# Patient Record
Sex: Female | Born: 1935 | Race: Black or African American | Hispanic: No | Smoking: Former smoker
Health system: Southern US, Community
[De-identification: ages and names within clinical notes are randomized; demographics above are authoritative.]

## PROBLEM LIST (undated history)

## (undated) DIAGNOSIS — D649 Anemia, unspecified: Secondary | ICD-10-CM

## (undated) DIAGNOSIS — I499 Cardiac arrhythmia, unspecified: Secondary | ICD-10-CM

## (undated) DIAGNOSIS — I609 Nontraumatic subarachnoid hemorrhage, unspecified: Secondary | ICD-10-CM

## (undated) DIAGNOSIS — I10 Essential (primary) hypertension: Secondary | ICD-10-CM

## (undated) DIAGNOSIS — N189 Chronic kidney disease, unspecified: Secondary | ICD-10-CM

## (undated) DIAGNOSIS — J449 Chronic obstructive pulmonary disease, unspecified: Secondary | ICD-10-CM

## (undated) DIAGNOSIS — J45909 Unspecified asthma, uncomplicated: Secondary | ICD-10-CM

## (undated) DIAGNOSIS — K219 Gastro-esophageal reflux disease without esophagitis: Secondary | ICD-10-CM

## (undated) HISTORY — DX: Gastro-esophageal reflux disease without esophagitis: K21.9

## (undated) HISTORY — PX: ABDOMINAL HYSTERECTOMY: SHX81

## (undated) HISTORY — PX: LUNG SURGERY: SHX703

---

## 2014-01-28 ENCOUNTER — Encounter (HOSPITAL_BASED_OUTPATIENT_CLINIC_OR_DEPARTMENT_OTHER): Payer: Self-pay | Admitting: Emergency Medicine

## 2014-01-28 ENCOUNTER — Emergency Department (HOSPITAL_BASED_OUTPATIENT_CLINIC_OR_DEPARTMENT_OTHER)
Admission: EM | Admit: 2014-01-28 | Discharge: 2014-01-28 | Disposition: A | Discharge status: Home / Self Care | Payer: Medicare Other | Attending: Emergency Medicine | Admitting: Emergency Medicine

## 2014-01-28 DIAGNOSIS — R220 Localized swelling, mass and lump, head: Secondary | ICD-10-CM | POA: Insufficient documentation

## 2014-01-28 DIAGNOSIS — T7840XA Allergy, unspecified, initial encounter: Secondary | ICD-10-CM

## 2014-01-28 DIAGNOSIS — Z87828 Personal history of other (healed) physical injury and trauma: Secondary | ICD-10-CM | POA: Insufficient documentation

## 2014-01-28 DIAGNOSIS — J45909 Unspecified asthma, uncomplicated: Secondary | ICD-10-CM | POA: Insufficient documentation

## 2014-01-28 DIAGNOSIS — R221 Localized swelling, mass and lump, neck: Principal | ICD-10-CM

## 2014-01-28 DIAGNOSIS — Z87891 Personal history of nicotine dependence: Secondary | ICD-10-CM | POA: Insufficient documentation

## 2014-01-28 DIAGNOSIS — T492X5A Adverse effect of local astringents and local detergents, initial encounter: Secondary | ICD-10-CM | POA: Insufficient documentation

## 2014-01-28 HISTORY — DX: Unspecified asthma, uncomplicated: J45.909

## 2014-01-28 HISTORY — DX: Nontraumatic subarachnoid hemorrhage, unspecified: I60.9

## 2014-01-28 HISTORY — DX: Essential (primary) hypertension: I10

## 2014-01-28 MED ORDER — DIPHENHYDRAMINE HCL 25 MG PO CAPS: 25.0000 mg | ORAL_CAPSULE | Freq: Three times a day (TID) | ORAL | Status: DC | PRN

## 2014-01-28 MED ORDER — PREDNISONE 50 MG PO TABS
60.0000 mg | ORAL_TABLET | Freq: Once | ORAL | Status: AC
  Administered 2014-01-28: 60 mg via ORAL
  Filled 2014-01-28 (×2): qty 1

## 2014-01-28 MED ORDER — DIPHENHYDRAMINE HCL 25 MG PO CAPS
25.0000 mg | ORAL_CAPSULE | Freq: Once | ORAL | Status: AC
  Administered 2014-01-28: 25 mg via ORAL
  Filled 2014-01-28: qty 1

## 2014-01-28 MED ORDER — PREDNISONE 20 MG PO TABS: 40.0000 mg | ORAL_TABLET | Freq: Once | ORAL | Status: DC

## 2014-01-28 MED ORDER — FAMOTIDINE 20 MG PO TABS
20.0000 mg | ORAL_TABLET | Freq: Once | ORAL | Status: AC
  Administered 2014-01-28: 20 mg via ORAL
  Filled 2014-01-28: qty 1

## 2014-01-28 MED ORDER — FAMOTIDINE 20 MG PO TABS: 20.0000 mg | ORAL_TABLET | Freq: Every day | ORAL | Status: AC

## 2014-01-28 NOTE — Discharge Instructions (Signed)
Allergies °Allergies may happen from anything your body is sensitive to. This may be food, medicines, pollens, chemicals, and nearly anything around you in everyday life that produces allergens. An allergen is anything that causes an allergy producing substance. Heredity is often a factor in causing these problems. This means you may have some of the same allergies as your parents. °Food allergies happen in all age groups. Food allergies are some of the most severe and life threatening. Some common food allergies are cow's milk, seafood, eggs, nuts, wheat, and soybeans. °SYMPTOMS  °· Swelling around the mouth. °· An itchy red rash or hives. °· Vomiting or diarrhea. °· Difficulty breathing. °SEVERE ALLERGIC REACTIONS ARE LIFE-THREATENING. °This reaction is called anaphylaxis. It can cause the mouth and throat to swell and cause difficulty with breathing and swallowing. In severe reactions only a trace amount of food (for example, peanut oil in a salad) may cause death within seconds. °Seasonal allergies occur in all age groups. These are seasonal because they usually occur during the same season every year. They may be a reaction to molds, grass pollens, or tree pollens. Other causes of problems are house dust mite allergens, pet dander, and mold spores. The symptoms often consist of nasal congestion, a runny itchy nose associated with sneezing, and tearing itchy eyes. There is often an associated itching of the mouth and ears. The problems happen when you come in contact with pollens and other allergens. Allergens are the particles in the air that the body reacts to with an allergic reaction. This causes you to release allergic antibodies. Through a chain of events, these eventually cause you to release histamine into the blood stream. Although it is meant to be protective to the body, it is this release that causes your discomfort. This is why you were given anti-histamines to feel better.  If you are unable to  pinpoint the offending allergen, it may be determined by skin or blood testing. Allergies cannot be cured but can be controlled with medicine. °Hay fever is a collection of all or some of the seasonal allergy problems. It may often be treated with simple over-the-counter medicine such as diphenhydramine. Take medicine as directed. Do not drink alcohol or drive while taking this medicine. Check with your caregiver or package insert for child dosages. °If these medicines are not effective, there are many new medicines your caregiver can prescribe. Stronger medicine such as nasal spray, eye drops, and corticosteroids may be used if the first things you try do not work well. Other treatments such as immunotherapy or desensitizing injections can be used if all else fails. Follow up with your caregiver if problems continue. These seasonal allergies are usually not life threatening. They are generally more of a nuisance that can often be handled using medicine. °HOME CARE INSTRUCTIONS  °· If unsure what causes a reaction, keep a diary of foods eaten and symptoms that follow. Avoid foods that cause reactions. °· If hives or rash are present: °· Take medicine as directed. °· You may use an over-the-counter antihistamine (diphenhydramine) for hives and itching as needed. °· Apply cold compresses (cloths) to the skin or take baths in cool water. Avoid hot baths or showers. Heat will make a rash and itching worse. °· If you are severely allergic: °· Following a treatment for a severe reaction, hospitalization is often required for closer follow-up. °· Wear a medic-alert bracelet or necklace stating the allergy. °· You and your family must learn how to give adrenaline or use   an anaphylaxis kit. °· If you have had a severe reaction, always carry your anaphylaxis kit or EpiPen® with you. Use this medicine as directed by your caregiver if a severe reaction is occurring. Failure to do so could have a fatal outcome. °SEEK MEDICAL  CARE IF: °· You suspect a food allergy. Symptoms generally happen within 30 minutes of eating a food. °· Your symptoms have not gone away within 2 days or are getting worse. °· You develop new symptoms. °· You want to retest yourself or your child with a food or drink you think causes an allergic reaction. Never do this if an anaphylactic reaction to that food or drink has happened before. Only do this under the care of a caregiver. °SEEK IMMEDIATE MEDICAL CARE IF:  °· You have difficulty breathing, are wheezing, or have a tight feeling in your chest or throat. °· You have a swollen mouth, or you have hives, swelling, or itching all over your body. °· You have had a severe reaction that has responded to your anaphylaxis kit or an EpiPen®. These reactions may return when the medicine has worn off. These reactions should be considered life threatening. °MAKE SURE YOU:  °· Understand these instructions. °· Will watch your condition. °· Will get help right away if you are not doing well or get worse. °Document Released: 01/17/2003 Document Revised: 02/18/2013 Document Reviewed: 06/23/2008 °[employer]® Patient Information ©2014 [employer]. ° °

## 2014-01-28 NOTE — ED Notes (Signed)
Swelling to face x4 days, getting progressively worse.  No airway compromise.

## 2014-01-28 NOTE — ED Provider Notes (Signed)
CSN: [id number]     Arrival date & time 01/28/14  1604 History   First MD Initiated Contact with Patient 01/28/14 1609     Chief Complaint  Patient presents with  . Facial Swelling     (Consider location/radiation/quality/duration/timing/severity/associated sxs/prior Treatment) HPI Pt reports over the last 4-5 days she has had worsening swelling of her face. States it started around her eyes and she thought it was related to pollen in the air. She also began using a new body soap around that same time. She has not had any itching, no rash on arms, legs, trunk. No throat swelling or difficulty with breathing or swallowing. Sent from ALF for evaluation.   Past Medical History  Diagnosis Date  . Subarachnoid hemorrhage   . Asthma    Past Surgical History  Procedure Laterality Date  . Lung surgery    . Abdominal hysterectomy     No family history on file. History  Substance Use Topics  . Smoking status: Former Games developermoker  . Smokeless tobacco: Not on file  . Alcohol Use: No   OB History   Grav Para Term Preterm Abortions TAB SAB Ect Mult Living                 Review of Systems All other systems reviewed and are negative except as noted in HPI.     Allergies  Review of patient's allergies indicates no known allergies.  Home Medications  No current outpatient prescriptions on file. BP 181/106  Pulse 86  Temp(Src) 98.2 F (36.8 C) (Oral)  Resp 20  SpO2 98% Physical Exam  Nursing note and vitals reviewed. Constitutional: She is oriented to person, place, and time. She appears well-developed and well-nourished.  HENT:  Head: Normocephalic and atraumatic.  Eyes: EOM are normal. Pupils are equal, round, and reactive to light.  Neck: Normal range of motion. Neck supple.  Cardiovascular: Normal rate, normal heart sounds and intact distal pulses.   Pulmonary/Chest: Effort normal and breath sounds normal.  Abdominal: Bowel sounds are normal. She exhibits no distension. There  is no tenderness.  Musculoskeletal: Normal range of motion. She exhibits no edema and no tenderness.  Neurological: She is alert and oriented to person, place, and time. She has normal strength. No cranial nerve deficit or sensory deficit.  Skin: Skin is warm and dry. Rash (mild-moderate soft tissue swelling of face with increased pigmentation compared to photo from January [facility] ) noted.  Psychiatric: She has a normal mood and affect.    ED Course  Procedures (including critical care time) Labs Review Labs Reviewed - No data to display Imaging Review No results found.   EKG Interpretation None      MDM   Final diagnoses:  Allergic reaction    Likely allergic reaction, ?new soap as allergen. Will treat with prednisone, pepcid, benadryl. Avoid topical steroids on face.   5:50 PM Pt feeling better, facial swelling improved. Advised to stop her new soap. Continue short course of prednisone.   Reeve Mallo B. Bernette MayersSheldon, MD 01/28/14 [phone]

## 2015-06-26 ENCOUNTER — Inpatient Hospital Stay (HOSPITAL_COMMUNITY)
Admission: EM | Admit: 2015-06-26 | Discharge: 2015-07-03 | DRG: 190 | Disposition: A | Discharge status: Skilled Nursing Facility | Payer: Medicare HMO | Attending: Internal Medicine | Admitting: Internal Medicine

## 2015-06-26 ENCOUNTER — Emergency Department (HOSPITAL_COMMUNITY): Payer: Medicare HMO

## 2015-06-26 ENCOUNTER — Encounter (HOSPITAL_COMMUNITY): Payer: Self-pay | Admitting: *Deleted

## 2015-06-26 DIAGNOSIS — R4182 Altered mental status, unspecified: Secondary | ICD-10-CM | POA: Diagnosis present

## 2015-06-26 DIAGNOSIS — G9340 Encephalopathy, unspecified: Secondary | ICD-10-CM | POA: Diagnosis not present

## 2015-06-26 DIAGNOSIS — I129 Hypertensive chronic kidney disease with stage 1 through stage 4 chronic kidney disease, or unspecified chronic kidney disease: Secondary | ICD-10-CM | POA: Diagnosis present

## 2015-06-26 DIAGNOSIS — E870 Hyperosmolality and hypernatremia: Secondary | ICD-10-CM | POA: Diagnosis not present

## 2015-06-26 DIAGNOSIS — N189 Chronic kidney disease, unspecified: Secondary | ICD-10-CM | POA: Diagnosis present

## 2015-06-26 DIAGNOSIS — G92 Toxic encephalopathy: Secondary | ICD-10-CM | POA: Diagnosis present

## 2015-06-26 DIAGNOSIS — J9621 Acute and chronic respiratory failure with hypoxia: Secondary | ICD-10-CM | POA: Diagnosis present

## 2015-06-26 DIAGNOSIS — Z79899 Other long term (current) drug therapy: Secondary | ICD-10-CM

## 2015-06-26 DIAGNOSIS — N179 Acute kidney failure, unspecified: Secondary | ICD-10-CM | POA: Diagnosis present

## 2015-06-26 DIAGNOSIS — G4733 Obstructive sleep apnea (adult) (pediatric): Secondary | ICD-10-CM | POA: Diagnosis present

## 2015-06-26 DIAGNOSIS — E872 Acidosis: Secondary | ICD-10-CM | POA: Diagnosis not present

## 2015-06-26 DIAGNOSIS — Z66 Do not resuscitate: Secondary | ICD-10-CM | POA: Diagnosis present

## 2015-06-26 DIAGNOSIS — R479 Unspecified speech disturbances: Secondary | ICD-10-CM

## 2015-06-26 DIAGNOSIS — R4701 Aphasia: Secondary | ICD-10-CM | POA: Diagnosis present

## 2015-06-26 DIAGNOSIS — R0600 Dyspnea, unspecified: Secondary | ICD-10-CM | POA: Diagnosis not present

## 2015-06-26 DIAGNOSIS — Z8249 Family history of ischemic heart disease and other diseases of the circulatory system: Secondary | ICD-10-CM

## 2015-06-26 DIAGNOSIS — J441 Chronic obstructive pulmonary disease with (acute) exacerbation: Secondary | ICD-10-CM | POA: Diagnosis present

## 2015-06-26 DIAGNOSIS — J9622 Acute and chronic respiratory failure with hypercapnia: Secondary | ICD-10-CM | POA: Diagnosis present

## 2015-06-26 DIAGNOSIS — J45909 Unspecified asthma, uncomplicated: Secondary | ICD-10-CM | POA: Diagnosis present

## 2015-06-26 DIAGNOSIS — R0602 Shortness of breath: Secondary | ICD-10-CM | POA: Diagnosis present

## 2015-06-26 DIAGNOSIS — Z87891 Personal history of nicotine dependence: Secondary | ICD-10-CM

## 2015-06-26 DIAGNOSIS — J9602 Acute respiratory failure with hypercapnia: Secondary | ICD-10-CM | POA: Diagnosis present

## 2015-06-26 DIAGNOSIS — I471 Supraventricular tachycardia: Secondary | ICD-10-CM | POA: Diagnosis not present

## 2015-06-26 DIAGNOSIS — J9612 Chronic respiratory failure with hypercapnia: Secondary | ICD-10-CM

## 2015-06-26 LAB — BLOOD GAS, ARTERIAL
Acid-Base Excess: 10.8 mmol/L — ABNORMAL HIGH (ref 0.0–2.0)
Bicarbonate: 41.8 meq/L — ABNORMAL HIGH (ref 20.0–24.0)
DRAWN BY: 276051
Delivery systems: POSITIVE
Drawn by: 103701
EXPIRATORY PAP: 8
FIO2: 0.4
Inspiratory PAP: 16
MODE: POSITIVE
O2 Content: 2 L/min
O2 SAT: 93.5 %
O2 Saturation: 93.9 %
PATIENT TEMPERATURE: 98.6
PH ART: 7.176 — AB (ref 7.350–7.450)
Patient temperature: 98.6
RATE: 12 resp/min
TCO2: 40.4 mmol/L (ref 0–100)
pCO2 arterial: 111 mmHg (ref 35.0–45.0)
pH, Arterial: 7.201 — ABNORMAL LOW (ref 7.350–7.450)
pO2, Arterial: 78.9 mmHg — ABNORMAL LOW (ref 80.0–100.0)
pO2, Arterial: 76.2 mmHg — ABNORMAL LOW (ref 80.0–100.0)

## 2015-06-26 LAB — URINALYSIS, ROUTINE W REFLEX MICROSCOPIC
GLUCOSE, UA: NEGATIVE mg/dL
HGB URINE DIPSTICK: NEGATIVE
Ketones, ur: NEGATIVE mg/dL
LEUKOCYTES UA: NEGATIVE
Nitrite: NEGATIVE
PH: 5 (ref 5.0–8.0)
Protein, ur: 100 mg/dL — AB
SPECIFIC GRAVITY, URINE: 1.018 (ref 1.005–1.030)
Urobilinogen, UA: 0.2 mg/dL (ref 0.0–1.0)

## 2015-06-26 LAB — COMPREHENSIVE METABOLIC PANEL
ALK PHOS: 70 U/L (ref 38–126)
ALT: 28 U/L (ref 14–54)
AST: 54 U/L — ABNORMAL HIGH (ref 15–41)
Albumin: 3.9 g/dL (ref 3.5–5.0)
Anion gap: 9 (ref 5–15)
BUN: 34 mg/dL — ABNORMAL HIGH (ref 6–20)
CALCIUM: 9.3 mg/dL (ref 8.9–10.3)
CO2: 37 mmol/L — ABNORMAL HIGH (ref 22–32)
CREATININE: 2.22 mg/dL — AB (ref 0.44–1.00)
Chloride: 96 mmol/L — ABNORMAL LOW (ref 101–111)
GFR, EST AFRICAN AMERICAN: 22 mL/min — AB (ref >60)
GFR, EST NON AFRICAN AMERICAN: 19 mL/min — AB (ref >60)
Glucose, Bld: 133 mg/dL — ABNORMAL HIGH (ref 65–99)
Potassium: 4.6 mmol/L (ref 3.5–5.1)
Sodium: 142 mmol/L (ref 135–145)
Total Bilirubin: 0.4 mg/dL (ref 0.3–1.2)
Total Protein: 7.3 g/dL (ref 6.5–8.1)

## 2015-06-26 LAB — HEPATIC FUNCTION PANEL
ALBUMIN: 4.1 g/dL (ref 3.5–5.0)
ALT: 28 U/L (ref 14–54)
AST: 44 U/L — AB (ref 15–41)
Alkaline Phosphatase: 76 U/L (ref 38–126)
BILIRUBIN TOTAL: 0.4 mg/dL (ref 0.3–1.2)
Total Protein: 7.6 g/dL (ref 6.5–8.1)

## 2015-06-26 LAB — CBC WITH DIFFERENTIAL/PLATELET
Basophils Absolute: 0 10*3/uL (ref 0.0–0.1)
Basophils Relative: 0 % (ref 0–1)
EOS PCT: 0 % (ref 0–5)
Eosinophils Absolute: 0 10*3/uL (ref 0.0–0.7)
HCT: 35.9 % — ABNORMAL LOW (ref 36.0–46.0)
HEMOGLOBIN: 11.2 g/dL — AB (ref 12.0–15.0)
LYMPHS ABS: 0.8 10*3/uL (ref 0.7–4.0)
Lymphocytes Relative: 8 % — ABNORMAL LOW (ref 12–46)
MCH: 30.7 pg (ref 26.0–34.0)
MCHC: 31.2 g/dL (ref 30.0–36.0)
MCV: 98.4 fL (ref 78.0–100.0)
MONOS PCT: 5 % (ref 3–12)
Monocytes Absolute: 0.4 10*3/uL (ref 0.1–1.0)
NEUTROS PCT: 87 % — AB (ref 43–77)
Neutro Abs: 8 10*3/uL — ABNORMAL HIGH (ref 1.7–7.7)
Platelets: 151 10*3/uL (ref 150–400)
RBC: 3.65 MIL/uL — AB (ref 3.87–5.11)
RDW: 13.9 % (ref 11.5–15.5)
WBC: 9.2 10*3/uL (ref 4.0–10.5)

## 2015-06-26 LAB — BRAIN NATRIURETIC PEPTIDE: B Natriuretic Peptide: 114.7 pg/mL — ABNORMAL HIGH (ref 0.0–100.0)

## 2015-06-26 LAB — CBC
HEMATOCRIT: 36 % (ref 36.0–46.0)
Hemoglobin: 11.4 g/dL — ABNORMAL LOW (ref 12.0–15.0)
MCH: 31.1 pg (ref 26.0–34.0)
MCHC: 31.7 g/dL (ref 30.0–36.0)
MCV: 98.4 fL (ref 78.0–100.0)
PLATELETS: 146 10*3/uL — AB (ref 150–400)
RBC: 3.66 MIL/uL — ABNORMAL LOW (ref 3.87–5.11)
RDW: 14.1 % (ref 11.5–15.5)
WBC: 6.8 10*3/uL (ref 4.0–10.5)

## 2015-06-26 LAB — CREATININE, SERUM
Creatinine, Ser: 2.11 mg/dL — ABNORMAL HIGH (ref 0.44–1.00)
GFR calc non Af Amer: 20 mL/min — ABNORMAL LOW (ref >60)
GFR, EST AFRICAN AMERICAN: 23 mL/min — AB (ref >60)

## 2015-06-26 LAB — LACTIC ACID, PLASMA
Lactic Acid, Venous: 1.7 mmol/L (ref 0.5–2.0)
Lactic Acid, Venous: 1.6 mmol/L (ref 0.5–2.0)

## 2015-06-26 LAB — MAGNESIUM: Magnesium: 2.2 mg/dL (ref 1.7–2.4)

## 2015-06-26 LAB — URINE MICROSCOPIC-ADD ON

## 2015-06-26 LAB — MRSA PCR SCREENING: MRSA BY PCR: NEGATIVE

## 2015-06-26 LAB — GLUCOSE, CAPILLARY
GLUCOSE-CAPILLARY: 77 mg/dL (ref 65–99)
Glucose-Capillary: 107 mg/dL — ABNORMAL HIGH (ref 65–99)

## 2015-06-26 LAB — TSH: TSH: 0.626 u[IU]/mL (ref 0.350–4.500)

## 2015-06-26 LAB — TROPONIN I
TROPONIN I: 0.03 ng/mL (ref <0.031)
TROPONIN I: 0.1 ng/mL — AB (ref <0.031)
Troponin I: 0.1 ng/mL — ABNORMAL HIGH (ref <0.031)

## 2015-06-26 LAB — LIPASE, BLOOD: LIPASE: 24 U/L (ref 22–51)

## 2015-06-26 MED ORDER — NALOXONE HCL 0.4 MG/ML IJ SOLN
INTRAMUSCULAR | Status: AC
  Filled 2015-06-26: qty 1

## 2015-06-26 MED ORDER — LEVALBUTEROL HCL 0.63 MG/3ML IN NEBU: 0.6300 mg | INHALATION_SOLUTION | Freq: Four times a day (QID) | RESPIRATORY_TRACT | Status: DC | PRN

## 2015-06-26 MED ORDER — IPRATROPIUM BROMIDE 0.02 % IN SOLN
0.5000 mg | Freq: Four times a day (QID) | RESPIRATORY_TRACT | Status: DC
  Administered 2015-06-26 – 2015-06-27 (×5): 0.5 mg via RESPIRATORY_TRACT
  Filled 2015-06-26 (×6): qty 2.5

## 2015-06-26 MED ORDER — INSULIN ASPART 100 UNIT/ML ~~LOC~~ SOLN
0.0000 [IU] | SUBCUTANEOUS | Status: DC
  Administered 2015-06-27 (×2): 2 [IU] via SUBCUTANEOUS
  Administered 2015-06-28: 9 [IU] via SUBCUTANEOUS
  Administered 2015-06-28: 1 [IU] via SUBCUTANEOUS
  Administered 2015-06-28 (×2): 2 [IU] via SUBCUTANEOUS
  Administered 2015-06-28: 1 [IU] via SUBCUTANEOUS
  Administered 2015-06-29 (×2): 2 [IU] via SUBCUTANEOUS
  Administered 2015-06-29: 1 [IU] via SUBCUTANEOUS
  Administered 2015-06-30: 2 [IU] via SUBCUTANEOUS
  Administered 2015-06-30: 3 [IU] via SUBCUTANEOUS
  Administered 2015-06-30: 1 [IU] via SUBCUTANEOUS
  Administered 2015-06-30 (×3): 2 [IU] via SUBCUTANEOUS
  Administered 2015-07-01 (×2): 1 [IU] via SUBCUTANEOUS
  Administered 2015-07-01: 2 [IU] via SUBCUTANEOUS
  Administered 2015-07-01 – 2015-07-02 (×4): 1 [IU] via SUBCUTANEOUS

## 2015-06-26 MED ORDER — SODIUM CHLORIDE 0.9 % IV SOLN
INTRAVENOUS | Status: DC
  Administered 2015-06-26 – 2015-06-29 (×5): via INTRAVENOUS

## 2015-06-26 MED ORDER — MOMETASONE FURO-FORMOTEROL FUM 100-5 MCG/ACT IN AERO
2.0000 | INHALATION_SPRAY | Freq: Two times a day (BID) | RESPIRATORY_TRACT | Status: DC
  Administered 2015-06-27 – 2015-07-03 (×13): 2 via RESPIRATORY_TRACT
  Filled 2015-06-26: qty 8.8

## 2015-06-26 MED ORDER — NALOXONE HCL 0.4 MG/ML IJ SOLN
0.4000 mg | Freq: Once | INTRAMUSCULAR | Status: AC
  Administered 2015-06-26: 0.4 mg via INTRAVENOUS

## 2015-06-26 MED ORDER — SODIUM CHLORIDE 0.9 % IV BOLUS (SEPSIS)
250.0000 mL | Freq: Once | INTRAVENOUS | Status: AC
  Administered 2015-06-26: 250 mL via INTRAVENOUS

## 2015-06-26 MED ORDER — ASPIRIN 300 MG RE SUPP
300.0000 mg | Freq: Every day | RECTAL | Status: DC
  Administered 2015-06-26 – 2015-07-03 (×7): 300 mg via RECTAL
  Filled 2015-06-26 (×8): qty 1

## 2015-06-26 MED ORDER — ACETAMINOPHEN 650 MG RE SUPP: 650.0000 mg | Freq: Four times a day (QID) | RECTAL | Status: DC | PRN

## 2015-06-26 MED ORDER — CETYLPYRIDINIUM CHLORIDE 0.05 % MT LIQD: 7.0000 mL | Freq: Two times a day (BID) | OROMUCOSAL | Status: DC

## 2015-06-26 MED ORDER — METHYLPREDNISOLONE SODIUM SUCC 40 MG IJ SOLR
40.0000 mg | Freq: Four times a day (QID) | INTRAMUSCULAR | Status: DC
  Administered 2015-06-26 – 2015-06-27 (×3): 40 mg via INTRAVENOUS
  Filled 2015-06-26 (×3): qty 1

## 2015-06-26 MED ORDER — HEPARIN SODIUM (PORCINE) 5000 UNIT/ML IJ SOLN
5000.0000 [IU] | Freq: Three times a day (TID) | INTRAMUSCULAR | Status: DC
  Administered 2015-06-26 – 2015-07-03 (×21): 5000 [IU] via SUBCUTANEOUS
  Filled 2015-06-26 (×24): qty 1

## 2015-06-26 MED ORDER — SODIUM CHLORIDE 0.9 % IJ SOLN
3.0000 mL | Freq: Two times a day (BID) | INTRAMUSCULAR | Status: DC
  Administered 2015-06-26 – 2015-07-03 (×9): 3 mL via INTRAVENOUS

## 2015-06-26 MED ORDER — CHLORHEXIDINE GLUCONATE 0.12 % MT SOLN
15.0000 mL | Freq: Two times a day (BID) | OROMUCOSAL | Status: DC
  Administered 2015-06-26 – 2015-06-27 (×3): 15 mL via OROMUCOSAL

## 2015-06-26 MED ORDER — SODIUM CHLORIDE 0.9 % IV BOLUS (SEPSIS)
500.0000 mL | Freq: Once | INTRAVENOUS | Status: AC
  Administered 2015-06-26: 500 mL via INTRAVENOUS

## 2015-06-26 MED ORDER — PIPERACILLIN-TAZOBACTAM IN DEX 2-0.25 GM/50ML IV SOLN
2.2500 g | Freq: Four times a day (QID) | INTRAVENOUS | Status: DC
  Administered 2015-06-26 – 2015-06-29 (×12): 2.25 g via INTRAVENOUS
  Filled 2015-06-26 (×14): qty 50

## 2015-06-26 MED ORDER — SODIUM BICARBONATE 8.4 % IV SOLN
INTRAVENOUS | Status: DC
  Administered 2015-06-26: 14:00:00 via INTRAVENOUS
  Filled 2015-06-26: qty 150

## 2015-06-26 MED ORDER — ONDANSETRON HCL 4 MG PO TABS: 4.0000 mg | ORAL_TABLET | Freq: Four times a day (QID) | ORAL | Status: DC | PRN

## 2015-06-26 MED ORDER — ACETAMINOPHEN 325 MG PO TABS: 650.0000 mg | ORAL_TABLET | Freq: Four times a day (QID) | ORAL | Status: DC | PRN

## 2015-06-26 MED ORDER — SODIUM CHLORIDE 0.9 % IV SOLN
INTRAVENOUS | Status: AC
  Administered 2015-06-26: 16:00:00 via INTRAVENOUS

## 2015-06-26 MED ORDER — ONDANSETRON HCL 4 MG/2ML IJ SOLN: 4.0000 mg | Freq: Four times a day (QID) | INTRAMUSCULAR | Status: DC | PRN

## 2015-06-26 NOTE — Consult Note (Signed)
NEURO HOSPITALIST CONSULT NOTE   Referring physician: Abrol   Reason for Consult: Transient inability to speak.   HPI:                                                                                                                                          Andrea Weber is an 81 y.o. female who arrived to [facility] from assisted living due to confusion and generalized weakness with SOB.  Per chart patient has been battling diarrhea since Monday.  On arrival to ED she was found to be Hypoxic with O2 sat 80%.  She was placed on BiPAP and O2 Saturation quickly resolved. At one point in the ED she was noted to have a episode of inability to talk for several minutes then quickly resolved. Per ED note:  "SBP 120's after IVF bolus. Sats remain 99% on O2 2L N/C. ARF on labs; IVF continues. ED RN called me back into the exam room, states pt is no longer speaking with her. Pt is awake and alert, eyes open, will nod head yes/no (appropriately) to questions, no facial droop, grips equal, strength 5/5 equal bilat UE's and LE's. Pt follows commands correctly. Pt appears to start to speak, but does not. Pt will write "hello" on a piece of paper without difficulty." Discussed with ED MD and states at that time her O2 Saturation was normal at that time. She fully followed commands and would nod to answers correctly and write on paper what she was told to write.   Patient is on BiPAP currently.  Bipap was taken off to get history. Patient reports that she recalls when she was writing instead of talking.  Reports that she was not talking because it was just easier.   Past Medical History  Diagnosis Date  . Subarachnoid hemorrhage   . Asthma   . Hypertension     Past Surgical History  Procedure Laterality Date  . Lung surgery    . Abdominal hysterectomy      Family History  Problem Relation Age of Onset  . Hypertension Mother   . Hypertension Father      Social History:  reports  that she has quit smoking. Her smoking use included Cigarettes. She has never used smokeless tobacco. She reports that she does not drink alcohol or use illicit drugs.  No Known Allergies  MEDICATIONS:  Prior to Admission:  Prescriptions prior to admission  Medication Sig Dispense Refill Last Dose  . acetaminophen (TYLENOL) 325 MG tablet Take 650 mg by mouth every 4 (four) hours as needed for mild pain.   UNKNOWN  . albuterol (PROVENTIL HFA;VENTOLIN HFA) 108 (90 BASE) MCG/ACT inhaler Inhale 2 puffs into the lungs every 6 (six) hours as needed for wheezing or shortness of breath.   UNKNOWN  . cholecalciferol (VITAMIN D) 1000 UNITS tablet Take 1,000 Units by mouth daily.   06/25/2015 at Unknown time  . diphenhydrAMINE (BENADRYL) 25 mg capsule Take 1 capsule (25 mg total) by mouth every 8 (eight) hours as needed for itching. 30 capsule 0 UNKNOW  . ferrous sulfate 325 (65 FE) MG tablet Take 325 mg by mouth daily with breakfast.   06/25/2015 at Unknown time  . Fluticasone-Salmeterol (ADVAIR) 250-50 MCG/DOSE AEPB Inhale 1 puff into the lungs 2 (two) times daily.   06/25/2015 at Unknown time  . furosemide (LASIX) 20 MG tablet Take 20 mg by mouth 2 (two) times daily.    06/25/2015 at Unknown time  . loperamide (IMODIUM) 2 MG capsule Take 2 mg by mouth as needed for diarrhea or loose stools.   UNKNOWN  . loratadine (CLARITIN) 10 MG tablet Take 10 mg by mouth daily.   06/25/2015 at Unknown time  . losartan (COZAAR) 100 MG tablet Take 100 mg by mouth every morning.    06/25/2015 at Unknown time  . ondansetron (ZOFRAN) 4 MG tablet Take 4 mg by mouth every 4 (four) hours as needed for nausea or vomiting.   06/25/2015 at Unknown time  . famotidine (PEPCID) 20 MG tablet Take 1 tablet (20 mg total) by mouth daily. (Patient not taking: Reported on 06/26/2015) 7 tablet 0   . predniSONE (DELTASONE) 20 MG  tablet Take 2 tablets (40 mg total) by mouth once. (Patient not taking: Reported on 06/26/2015) 8 tablet 0    Scheduled: . sodium chloride   Intravenous STAT  . antiseptic oral rinse  7 mL Mouth Rinse q12n4p  . aspirin  300 mg Rectal Daily  . chlorhexidine  15 mL Mouth Rinse BID  . heparin  5,000 Units Subcutaneous 3 times per day  . insulin aspart  0-9 Units Subcutaneous 6 times per day  . ipratropium  0.5 mg Nebulization Q6H  . methylPREDNISolone (SOLU-MEDROL) injection  40 mg Intravenous Q6H  . mometasone-formoterol  2 puff Inhalation BID  . piperacillin-tazobactam (ZOSYN)  IV  2.25 g Intravenous Q6H  . sodium chloride  3 mL Intravenous Q12H     ROS:                                                                                                                                       History obtained from the patient  General ROS: negative for - chills, fatigue, fever, night sweats, weight gain or weight loss Psychological ROS: negative  for - behavioral disorder, hallucinations, memory difficulties, mood swings or suicidal ideation Ophthalmic ROS: negative for - blurry vision, double vision, eye pain or loss of vision ENT ROS: negative for - epistaxis, nasal discharge, oral lesions, sore throat, tinnitus or vertigo Allergy and Immunology ROS: negative for - hives or itchy/watery eyes Hematological and Lymphatic ROS: negative for - bleeding problems, bruising or swollen lymph nodes Endocrine ROS: negative for - galactorrhea, hair pattern changes, polydipsia/polyuria or temperature intolerance Respiratory ROS: negative for - cough, hemoptysis, shortness of breath or wheezing Cardiovascular ROS: negative for - chest pain, dyspnea on exertion, edema or irregular heartbeat Gastrointestinal ROS: negative for - abdominal pain, diarrhea, hematemesis, nausea/vomiting or stool incontinence Genito-Urinary ROS: negative for - dysuria, hematuria, incontinence or urinary  frequency/urgency Musculoskeletal ROS: negative for - joint swelling or muscular weakness Neurological ROS: as noted in HPI Dermatological ROS: negative for rash and skin lesion changes   Blood pressure 109/62, pulse 81, temperature 97.8 F (36.6 C), temperature source Oral, resp. rate 21, SpO2 91 %.   Neurologic Examination:                                                                                                      HEENT-  Normocephalic, no lesions, without obvious abnormality.  Normal external eye and conjunctiva.  Normal TM's bilaterally.  Normal auditory canals and external ears. Normal external nose, mucus membranes and septum.  Normal pharynx. Cardiovascular- S1, S2 normal, pulses palpable throughout   Lungs- chest clear, no wheezing, rales, normal symmetric air entry Abdomen- normal findings: bowel sounds normal Extremities- no edema Lymph-no adenopathy palpable Musculoskeletal-no joint tenderness, deformity or swelling Skin-warm and dry, no hyperpigmentation, vitiligo, or suspicious lesions  Neurological Examination Mental Status: Alert, oriented, thought content appropriate.  Speech fluent without evidence of aphasia.  Able to follow 3 step commands without difficulty. Cranial Nerves: II: Discs flat bilaterally; Visual fields grossly normal, pupils equal, round, reactive to light and accommodation III,IV, VI: ptosis not present, extra-ocular motions intact bilaterally V,VII: smile symmetric --at rest right facial droop but patient is edentulous, facial light touch sensation normal bilaterally VIII: hearing normal bilaterally IX,X: uvula rises symmetrically XI: bilateral shoulder shrug XII: midline tongue extension Motor: Right : Upper extremity   5/5    Left:     Upper extremity   5/5  Lower extremity   5/5     Lower extremity   5/5 Tone and bulk:normal tone throughout; no atrophy noted Sensory: Pinprick and light touch intact throughout, bilaterally Deep Tendon  Reflexes: 1+ and symmetric throughout UE and KJ with no AJ Plantars: Right: downgoing   Left: downgoing Cerebellar: normal finger-to-nose and normal heel-to-shin testing bilaterally Gait: not tested as she is on BiPAP    Lab Results: Basic Metabolic Panel:  Recent Labs Lab 06/26/15 1035  NA 142  K 4.6  CL 96*  CO2 37*  GLUCOSE 133*  BUN 34*  CREATININE 2.22*  CALCIUM 9.3    Liver Function Tests:  Recent Labs Lab 06/26/15 1035  AST 54*  ALT 28  ALKPHOS 70  BILITOT  0.4  PROT 7.3  ALBUMIN 3.9    Recent Labs Lab 06/26/15 1035  LIPASE 24   No results for input(s): AMMONIA in the last 168 hours.  CBC:  Recent Labs Lab 06/26/15 1035 06/26/15 1520  WBC 9.2 6.8  NEUTROABS 8.0*  --   HGB 11.2* 11.4*  HCT 35.9* 36.0  MCV 98.4 98.4  PLT 151 146*    Cardiac Enzymes:  Recent Labs Lab 06/26/15 1035  TROPONINI 0.03    Lipid Panel: No results for input(s): CHOL, TRIG, HDL, CHOLHDL, VLDL, LDLCALC in the last 168 hours.  CBG: No results for input(s): GLUCAP in the last 168 hours.  Microbiology: No results found for this or any previous visit.  Coagulation Studies: No results for input(s): LABPROT, INR in the last 72 hours.  Imaging: Dg Chest 2 View  06/26/2015   CLINICAL DATA:  Hypoxia.  Altered mental status.  EXAM: CHEST  2 VIEW  COMPARISON:  None.  FINDINGS: Moderate cardiomegaly and pulmonary vascular congestion are noted. No evidence of frank pulmonary edema or pulmonary consolidation. No evidence of pleural effusion. Bilateral parenchymal scarring noted with surgical staples in the right midlung field.  IMPRESSION: Cardiomegaly and pulmonary vascular congestion.  No acute findings.   Electronically Signed   By: Myles Rosenthal M.D.   On: 06/26/2015 11:53   Ct Head Wo Contrast  06/26/2015   CLINICAL DATA:  Acute mental status changes today.  EXAM: CT HEAD WITHOUT CONTRAST  TECHNIQUE: Contiguous axial images were obtained from the base of the skull  through the vertex without intravenous contrast.  COMPARISON:  None available.  FINDINGS: Stable age related cerebral atrophy, ventriculomegaly and periventricular white matter disease. Benign-appearing bilateral basal ganglia calcifications. No extra-axial fluid collections are identified. No CT findings for acute hemispheric infarction or intracranial hemorrhage. No mass lesions. The brainstem and cerebellum are normal.  The bony structures are intact. The paranasal sinuses and mastoid air cells are clear. The globes are intact. Moderate atherosclerotic calcifications are noted.  IMPRESSION: Age related cerebral atrophy, ventriculomegaly and periventricular white matter disease. No acute intracranial findings or mass lesions.   Electronically Signed   By: Rudie Meyer M.D.   On: 06/26/2015 11:44    Felicie Morn PA-C [facility] [phone]  06/26/2015, 4:07 PM  Patient seen and examined.  Clinical course and management discussed.  Necessary edits performed.  I agree with the above.  Assessment and plan of care developed and discussed below.    Assessment/Plan: 81 year old female presenting with complaints of weakness.  In ED had an episode of not talking.  Remaining neurological examination was unremarkable.  Patient was even able to write.  Patient recalls the event.  Doubt seizure or ischemic event.  Patient now at baseline.    Recommendations: 1.  ASA 81mg  daily 2.  No further neurologic intervention is recommended at this time.  If further questions arise, please call or page at that time.  Thank you for allowing neurology to participate in the care of this patient.  Thana Farr, MD [facility] [phone] 06/26/2015  6:36 PM

## 2015-06-26 NOTE — ED Notes (Signed)
Unable to obtain labs 

## 2015-06-26 NOTE — Progress Notes (Signed)
Spoke with Mattocks,Clarence, POA , informed him about patients condition, diagnosis , and he is now aware that the patient has declined since admission, has severe respiratory acidosis and a DNI , we are adjusting BiPap settings ,but despite all aggressive measures may CODE or pass away tonight

## 2015-06-26 NOTE — H&P (Signed)
[facility] History and Physical  Andrea Weber ZOX:[id number] DOB: 05-Sep-1926 DOA: 06/26/2015  Referring physician:   PCP: [name], MD   Chief Complaint: Shortness of breath  HPI:  81 year old female resident of assisted living, comes in with confusion, generalized weakness, worsening shortness of breath. Patient has had diarrhea since Monday. Last episode of diarrhea was prior to coming in. Patient was found to be hypoxic, 80% on room air, he quickly declined requiring BiPAP support. ABG showed pH of 7.2, PCO2 of 111, bicarbonate 37. Patient also found to be in acute on chronic renal failure. Patient is a poor historian and unable to provide any meaningful history. Most of the history is compiled from the patient's chart. Patient apparently also had an episode of becoming aphasic for several minutes in the ER with her eyes open, no seizure activity was noted, no focal motor weakness was noted, patient started talking after a few minutes and was apparently back to baseline. She did not have any episodes of diarrhea in the ER.      Review of Systems: negative for the following  Constitutional: Denies fever, chills, diaphoresis, appetite change and fatigue.  HEENT: Denies photophobia, eye pain, redness, hearing loss, ear pain, congestion, sore throat, rhinorrhea, sneezing, mouth sores, trouble swallowing, neck pain, neck stiffness and tinnitus.  Respiratory: Denies SOB, DOE, cough, chest tightness, and wheezing.  Cardiovascular: Denies chest pain, palpitations and leg swelling.  Gastrointestinal: Positive for nausea, vomiting, abdominal pain, diarrhea, constipation, blood in stool and abdominal distention.  Genitourinary: Denies dysuria, urgency, frequency, hematuria, flank pain and difficulty urinating.  Musculoskeletal: Denies myalgias, back pain, joint swelling, arthralgias and gait problem.  Skin: Denies pallor, rash and wound.  Neurological: Denies dizziness,  seizures, syncope, weakness, light-headedness, numbness and headaches.  Hematological: Denies adenopathy. Easy bruising, personal or family bleeding history  Psychiatric/Behavioral: Denies suicidal ideation, mood changes, confusion, nervousness, sleep disturbance and agitation       Past Medical History  Diagnosis Date  . Subarachnoid hemorrhage   . Asthma   . Hypertension      Past Surgical History  Procedure Laterality Date  . Lung surgery    . Abdominal hysterectomy        Social History:  reports that she has quit smoking. Her smoking use included Cigarettes. She has never used smokeless tobacco. She reports that she does not drink alcohol or use illicit drugs.    No Known Allergies      FAMILY HISTORY  When questioned  Directly-patient reports  No family history of HTN, CVA ,DIABETES, TB, Cancer CAD, Bleeding Disorders, Sickle Cell, diabetes, anemia, asthma,   Prior to Admission medications   Medication Sig Start Date End Date Taking? Authorizing Provider  acetaminophen (TYLENOL) 325 MG tablet Take 650 mg by mouth every 4 (four) hours as needed for mild pain.   Yes Historical Provider, MD  albuterol (PROVENTIL HFA;VENTOLIN HFA) 108 (90 BASE) MCG/ACT inhaler Inhale 2 puffs into the lungs every 6 (six) hours as needed for wheezing or shortness of breath.   Yes Historical Provider, MD  cholecalciferol (VITAMIN D) 1000 UNITS tablet Take 1,000 Units by mouth daily.   Yes Historical Provider, MD  diphenhydrAMINE (BENADRYL) 25 mg capsule Take 1 capsule (25 mg total) by mouth every 8 (eight) hours as needed for itching. 01/28/14  Yes Susy Frizzle, MD  ferrous sulfate 325 (65 FE) MG tablet Take 325 mg by mouth daily with breakfast.   Yes Historical Provider, MD  Fluticasone-Salmeterol (ADVAIR) 250-50 MCG/DOSE  AEPB Inhale 1 puff into the lungs 2 (two) times daily.   Yes Historical Provider, MD  furosemide (LASIX) 20 MG tablet Take 20 mg by mouth 2 (two) times daily.    Yes  Historical Provider, MD  loperamide (IMODIUM) 2 MG capsule Take 2 mg by mouth as needed for diarrhea or loose stools.   Yes Historical Provider, MD  loratadine (CLARITIN) 10 MG tablet Take 10 mg by mouth daily.   Yes Historical Provider, MD  losartan (COZAAR) 100 MG tablet Take 100 mg by mouth every morning.    Yes Historical Provider, MD  ondansetron (ZOFRAN) 4 MG tablet Take 4 mg by mouth every 4 (four) hours as needed for nausea or vomiting.   Yes Historical Provider, MD  famotidine (PEPCID) 20 MG tablet Take 1 tablet (20 mg total) by mouth daily. Patient not taking: Reported on 06/26/2015 01/28/14   Susy Frizzle, MD  predniSONE (DELTASONE) 20 MG tablet Take 2 tablets (40 mg total) by mouth once. Patient not taking: Reported on 06/26/2015 01/28/14   Susy Frizzle, MD     Physical Exam: Filed Vitals:   06/26/15 1200 06/26/15 1230 06/26/15 1245 06/26/15 1250  BP: 124/58 128/67 115/63   Pulse: 95 90 92   Temp:    97.8 F (36.6 C)  TempSrc:    Oral  Resp: 28 24 26    SpO2:         Constitutional: Vital signs reviewed. Patient is currently on BiPAP,  uncooperative with exam. Awake but disoriented Head: Normocephalic and atraumatic  Ear: TM normal bilaterally  Mouth: no erythema or exudates, MMM  Eyes: PERRL, EOMI, conjunctivae normal, No scleral icterus.  Neck: Supple, Trachea midline normal ROM, No JVD, mass, thyromegaly, or carotid bruit present.  Cardiovascular: RRR, S1 normal, S2 normal, no MRG, pulses symmetric and intact bilaterally  Pulmonary/Chest: CTAB, no wheezes, rales, or rhonchi  Abdominal: Soft. Non-tender, non-distended, bowel sounds are normal, no masses, organomegaly, or guarding present.  GU: no CVA tenderness Musculoskeletal: No joint deformities, erythema, or stiffness, ROM full and no nontender Ext: no edema and no cyanosis, pulses palpable bilaterally (DP and PT)  Hematology: no cervical, inginal, or axillary adenopathy.  Neurological: Awake but disoriented,  Strenght is normal and symmetric bilaterally, cranial nerve II-XII are grossly intact, no focal motor deficit, sensory intact to light touch bilaterally.  Skin: Warm, dry and intact. No rash, cyanosis, or clubbing.  Psychiatric: Normal mood and affect. speech and behavior is normal. Judgment and thought content normal. Cognition and memory are normal.      Data Review   Micro Results No results found for this or any previous visit (from the past 240 hour(s)).  Radiology Reports Dg Chest 2 View  06/26/2015   CLINICAL DATA:  Hypoxia.  Altered mental status.  EXAM: CHEST  2 VIEW  COMPARISON:  None.  FINDINGS: Moderate cardiomegaly and pulmonary vascular congestion are noted. No evidence of frank pulmonary edema or pulmonary consolidation. No evidence of pleural effusion. Bilateral parenchymal scarring noted with surgical staples in the right midlung field.  IMPRESSION: Cardiomegaly and pulmonary vascular congestion.  No acute findings.   Electronically Signed   By: Myles Rosenthal M.D.   On: 06/26/2015 11:53   Ct Head Wo Contrast  06/26/2015   CLINICAL DATA:  Acute mental status changes today.  EXAM: CT HEAD WITHOUT CONTRAST  TECHNIQUE: Contiguous axial images were obtained from the base of the skull through the vertex without intravenous contrast.  COMPARISON:  None available.  FINDINGS: Stable age related cerebral atrophy, ventriculomegaly and periventricular white matter disease. Benign-appearing bilateral basal ganglia calcifications. No extra-axial fluid collections are identified. No CT findings for acute hemispheric infarction or intracranial hemorrhage. No mass lesions. The brainstem and cerebellum are normal.  The bony structures are intact. The paranasal sinuses and mastoid air cells are clear. The globes are intact. Moderate atherosclerotic calcifications are noted.  IMPRESSION: Age related cerebral atrophy, ventriculomegaly and periventricular white matter disease. No acute intracranial  findings or mass lesions.   Electronically Signed   By: Rudie Meyer M.D.   On: 06/26/2015 11:44     CBC  Recent Labs Lab 06/26/15 1035  WBC 9.2  HGB 11.2*  HCT 35.9*  PLT 151  MCV 98.4  MCH 30.7  MCHC 31.2  RDW 13.9  LYMPHSABS 0.8  MONOABS 0.4  EOSABS 0.0  BASOSABS 0.0    Chemistries   Recent Labs Lab 06/26/15 1035  NA 142  K 4.6  CL 96*  CO2 37*  GLUCOSE 133*  BUN 34*  CREATININE 2.22*  CALCIUM 9.3  AST 54*  ALT 28  ALKPHOS 70  BILITOT 0.4   ------------------------------------------------------------------------------------------------------------------ CrCl cannot be calculated (Unknown ideal weight.). ------------------------------------------------------------------------------------------------------------------ No results for input(s): HGBA1C in the last 72 hours. ------------------------------------------------------------------------------------------------------------------ No results for input(s): CHOL, HDL, LDLCALC, TRIG, CHOLHDL, LDLDIRECT in the last 72 hours. ------------------------------------------------------------------------------------------------------------------ No results for input(s): TSH, T4TOTAL, T3FREE, THYROIDAB in the last 72 hours.  Invalid input(s): FREET3 ------------------------------------------------------------------------------------------------------------------ No results for input(s): VITAMINB12, FOLATE, FERRITIN, TIBC, IRON, RETICCTPCT in the last 72 hours.  Coagulation profile No results for input(s): INR, PROTIME in the last 168 hours.  No results for input(s): DDIMER in the last 72 hours.  Cardiac Enzymes  Recent Labs Lab 06/26/15 1035  TROPONINI 0.03   ------------------------------------------------------------------------------------------------------------------ Invalid input(s): POCBNP   CBG: No results for input(s): GLUCAP in the last 168 hours.     EKG: Independently reviewed.     Assessment/Plan Active Problems:   Altered mental status-likely secondary to hypercarbic hypoxemic respiratory failure, patient probably encephalopathic also secondary to acute renal failure, treat underlying causes, CT negative, hold off on MRI to rule out CVA until the patient is more stable. Maybe consider doing an MRI tomorrow after patient is off the BiPAP.    Acute respiratory failure with hypercapnia-continue BiPAP and repeat ABG in a few hours, likely has underlying COPD, start the patient on IV Solu-Medrol, empiric antibiotics as the patient is from a nursing home, chest x-ray negative for pneumonia  Acute renal failure, baseline creatinine unknown, will change the patient to normal saline, patient has elevated bicarbonate probably compensation from a respiratory acidosis and appears to be chronic. Follow renal function   Diarrhea-order GI pathogen panel, enteric precautions, C. difficile PCR, nothing by mouth for now   Code Status:  DO NOT RESUSCITATE Family Communication: No family by the bedside Disposition Plan: admit   Total time spent 55 minutes.Greater than 50% of this time was spent in counseling, explanation of diagnosis, planning of further management, and coordination of care  [facility] Pager [phone]  If 7PM-7AM, please contact night-coverage [email or web address] Password [email or web address] 06/26/2015, 2:13 PM

## 2015-06-26 NOTE — ED Notes (Signed)
Unable to tolerate orthostatic vital signs

## 2015-06-26 NOTE — Progress Notes (Addendum)
ANTIBIOTIC CONSULT NOTE - INITIAL  Pharmacy Consult for Zosyn Indication: rule out pneumonia  No Known Allergies  Patient Measurements:   TBW:   Vital Signs: Temp: 97.8 F (36.6 C) (08/19 1250) Temp Source: Oral (08/19 1250) BP: 119/64 mmHg (08/19 1400) Pulse Rate: 87 (08/19 1400) Intake/Output from previous day:   Intake/Output from this shift:    Labs:  Recent Labs  06/26/15 1035  WBC 9.2  HGB 11.2*  PLT 151  CREATININE 2.22*   CrCl cannot be calculated (Unknown ideal weight.). No results for input(s): VANCOTROUGH, VANCOPEAK, VANCORANDOM, GENTTROUGH, GENTPEAK, GENTRANDOM, TOBRATROUGH, TOBRAPEAK, TOBRARND, AMIKACINPEAK, AMIKACINTROU, AMIKACIN in the last 72 hours.   Microbiology: No results found for this or any previous visit (from the past 720 hour(s)).  Medical History: Past Medical History  Diagnosis Date  . Subarachnoid hemorrhage   . Asthma   . Hypertension    Medications:  Scheduled:  . aspirin  300 mg Rectal Daily  . heparin  5,000 Units Subcutaneous 3 times per day  . insulin aspart  0-9 Units Subcutaneous 6 times per day  . ipratropium  0.5 mg Nebulization Q6H  . methylPREDNISolone (SOLU-MEDROL) injection  40 mg Intravenous Q6H  . sodium chloride  3 mL Intravenous Q12H   Anti-infectives    None     Assessment: 44 yoF ALF resident with worsening SHOB, 4 day hx of diarrhea, in acute renal failure. Aphasic episode in ED, head CT negative. CXray: no acute lung process. Pharmacy requested to dose Zosyn.  SCr 2.22, Cl ~ 20 ml/min (normalized)  Goal of Therapy:  Appropriate antibiotic for treatment, dose/schedule appropriate for renal function  Plan:   Zosyn 2.25gm IV q6hr  Monitor renal function closely, adjust dose/schedule as needed  Otho Bellows PharmD Pager [phone] 06/26/2015, 2:36 PM

## 2015-06-26 NOTE — ED Notes (Addendum)
Called out for altered mental status, Fire dept initially found pt to be hypoxic, spo2 of 80% and altered mental status. Placed pt on NRB.   Upon EMS arrival pt at baseline mental status and spo2 up to 99%. BP 112/60. Have titrated o2 down to 2L [place] en route. Pt now feeling much better, "back to self.".  Patient reports calling staff at [facility] assisted [facility] this morning to request hospital transport, because she "wasn't feeling right." Denies cough, fever, chills or SOB.

## 2015-06-26 NOTE — Clinical Documentation Improvement (Signed)
Hospitalist  Can the diagnosis of CKD be further specified?   CKD Stage I - GFR greater than or equal to 90  CKD Stage II - GFR 60-89  CKD Stage III - GFR 30-59  CKD Stage IV - GFR 15-29  CKD Stage V - GFR < 15  ESRD (End Stage Renal Disease)  Other condition  Unable to clinically determine   Supporting Information: : (risk factors, signs and symptoms, diagnostics, treatment)  Acute on chronic renal failure noted per 8/19 progress notes.  8/19: Bun: 34,   Creat: 2.22,    GFR: 22.    Please exercise your independent, professional judgment when responding. A specific answer is not anticipated or expected.   Thank You, Rodman Pickle [employer] [place]

## 2015-06-26 NOTE — ED Provider Notes (Signed)
CSN: [id number]     Arrival date & time 06/26/15  1324 History   First MD Initiated Contact with Patient 06/26/15 [phone]     Chief Complaint  Patient presents with  . Altered Mental Status      HPI Pt was seen at 0955. Per EMS, NH report and pt: Pt c/o gradual onset and slow improvement of constant generalized weakness that began this morning. Pt states she was standing in her room at the assisted living facility, when she "felt confused" and "weak." Pt states she called the staff for assistance. EMS noted pt's O2 Sat to be "80%" on R/A on their arrival to the scene. O2 Sat improved to 99% on O2 2L N/C. Pt states she "feels much better now." Pt states she has had "watery diarrhea" for the past 4 days; which has been associated with poor PO intake. Denies syncope, no N/V, no abd pain, no back pain, no CP/SOB, no fevers, no black or blood in stools, no focal motor weakness, no tingling/numbness in extremities.     Past Medical History  Diagnosis Date  . Subarachnoid hemorrhage   . Asthma   . Hypertension    Past Surgical History  Procedure Laterality Date  . Lung surgery    . Abdominal hysterectomy      Social History  Substance Use Topics  . Smoking status: Former Games developer  . Smokeless tobacco: None  . Alcohol Use: No    Review of Systems ROS: Statement: All systems negative except as marked or noted in the HPI; Constitutional: Negative for fever and chills. ; ; Eyes: Negative for eye pain, redness and discharge. ; ; ENMT: Negative for ear pain, hoarseness, nasal congestion, sinus pressure and sore throat. ; ; Cardiovascular: Negative for chest pain, palpitations, diaphoresis, dyspnea and peripheral edema. ; ; Respiratory: Negative for cough, wheezing and stridor. ; ; Gastrointestinal: +diarrhea, poor PO intake. Negative for nausea, vomiting, abdominal pain, blood in stool, hematemesis, jaundice and rectal bleeding. . ; ; Genitourinary: Negative for dysuria, flank pain and hematuria. ; ;  Musculoskeletal: Negative for back pain and neck pain. Negative for swelling and trauma.; ; Skin: Negative for pruritus, rash, abrasions, blisters, bruising and skin lesion.; ; Neuro: +generalized weakness, AMS. Negative for headache, lightheadedness and neck stiffness. Negative for altered level of consciousness , extremity weakness, paresthesias, involuntary movement, seizure and syncope.      Allergies  Review of patient's allergies indicates no known allergies.  Home Medications   Prior to Admission medications   Medication Sig Start Date End Date Taking? Authorizing Provider  acetaminophen (TYLENOL) 325 MG tablet Take 650 mg by mouth every 4 (four) hours as needed for mild pain.   Yes Historical Provider, MD  albuterol (PROVENTIL HFA;VENTOLIN HFA) 108 (90 BASE) MCG/ACT inhaler Inhale 2 puffs into the lungs every 6 (six) hours as needed for wheezing or shortness of breath.   Yes Historical Provider, MD  cholecalciferol (VITAMIN D) 1000 UNITS tablet Take 1,000 Units by mouth daily.   Yes Historical Provider, MD  diphenhydrAMINE (BENADRYL) 25 mg capsule Take 1 capsule (25 mg total) by mouth every 8 (eight) hours as needed for itching. 01/28/14  Yes Susy Frizzle, MD  ferrous sulfate 325 (65 FE) MG tablet Take 325 mg by mouth daily with breakfast.   Yes Historical Provider, MD  Fluticasone-Salmeterol (ADVAIR) 250-50 MCG/DOSE AEPB Inhale 1 puff into the lungs 2 (two) times daily.   Yes Historical Provider, MD  furosemide (LASIX) 20 MG tablet Take 20  mg by mouth 2 (two) times daily.    Yes Historical Provider, MD  loperamide (IMODIUM) 2 MG capsule Take 2 mg by mouth as needed for diarrhea or loose stools.   Yes Historical Provider, MD  loratadine (CLARITIN) 10 MG tablet Take 10 mg by mouth daily.   Yes Historical Provider, MD  losartan (COZAAR) 100 MG tablet Take 100 mg by mouth every morning.    Yes Historical Provider, MD  ondansetron (ZOFRAN) 4 MG tablet Take 4 mg by mouth every 4 (four)  hours as needed for nausea or vomiting.   Yes Historical Provider, MD  famotidine (PEPCID) 20 MG tablet Take 1 tablet (20 mg total) by mouth daily. Patient not taking: Reported on 06/26/2015 01/28/14   Susy Frizzle, MD  predniSONE (DELTASONE) 20 MG tablet Take 2 tablets (40 mg total) by mouth once. Patient not taking: Reported on 06/26/2015 01/28/14   Susy Frizzle, MD   BP 93/49 mmHg  Pulse 106  Temp(Src) 98 F (36.7 C) (Oral)  Resp 28  SpO2 92%   Patient Vitals for the past 24 hrs:  BP Temp Temp src Pulse Resp SpO2  06/26/15 1250 - 97.8 F (36.6 C) Oral - - -  06/26/15 1245 115/63 mmHg - - 92 26 -  06/26/15 1230 128/67 mmHg - - 90 24 -  06/26/15 1200 124/58 mmHg - - 95 (!) 28 -  06/26/15 1136 (!) 115/54 mmHg - - 90 - 100 %  06/26/15 1100 113/61 mmHg - - 94 (!) 32 -  06/26/15 1048 92/59 mmHg - - 94 22 -  06/26/15 1030 (!) 72/45 mmHg - - 95 22 -  06/26/15 0946 - - - - - 92 %  06/26/15 0941 (!) 93/49 mmHg 98 F (36.7 C) Oral 106 (!) 28 (!) 77 %  06/26/15 0930 - - - - - (!) 80 %     Physical Exam  1000: Physical examination:  Nursing notes reviewed; Vital signs and O2 SAT reviewed;  Constitutional: Well developed, Well nourished, In no acute distress; Head:  Normocephalic, atraumatic; Eyes: EOMI, PERRL, No scleral icterus; ENMT: Mouth and pharynx normal, Mucous membranes dry;; Neck: Supple, Full range of motion, No lymphadenopathy; Cardiovascular: Regular rate and rhythm, No gallop; Respiratory: Breath sounds clear & equal bilaterally, No wheezes.  Speaking full sentences with ease, Normal respiratory effort/excursion; Chest: Nontender, Movement normal; Abdomen: Soft, Nontender, Nondistended, Normal bowel sounds; Genitourinary: No CVA tenderness; Extremities: Pulses normal, No tenderness, No edema, No calf edema or asymmetry.; Neuro: AA&Ox3, Major CN grossly intact. No facial droop. Speech clear. Grips equal. Strength 5/5 equal bilat UE's and LE's. Moves all extremities on stretcher  without apparent gross focal motor deficits.; Skin: Color normal, Warm, Dry.   ED Course  Procedures    Labs Review Imaging Review I have personally reviewed and evaluated these images and lab results as part of my medical decision-making.    EKG Interpretation   Date/Time:  Friday June 26 2015 10:38:28 EDT Ventricular Rate:  93 PR Interval:  184 QRS Duration: 97 QT Interval:  406 QTC Calculation: 505 R Axis:   -103 Text Interpretation:  Sinus rhythm Left axis deviation Inferior infarct,  old Anterior infarct, old Prolonged QT interval No old tracing to compare  Confirmed by [facility]  MD, Nicholos Johns [id number]) on 06/26/2015 10:55:37 AM      MDM  MDM Reviewed: previous chart, nursing note and vitals Interpretation: labs, ECG and x-ray Total time providing critical care: 30-74 minutes. This excludes time spent  performing separately reportable procedures and services. Consults: admitting MD   CRITICAL CARE Performed by: Laray Anger Total critical care time: 45 Critical care time was exclusive of separately billable procedures and treating other patients. Critical care was necessary to treat or prevent imminent or life-threatening deterioration. Critical care was time spent personally by me on the following activities: development of treatment plan with patient and/or surrogate as well as nursing, discussions with consultants, evaluation of patient's response to treatment, examination of patient, obtaining history from patient or surrogate, ordering and performing treatments and interventions, ordering and review of laboratory studies, ordering and review of radiographic studies, pulse oximetry and re-evaluation of patient's condition.  Results for orders placed or performed during the hospital encounter of 06/26/15  Urinalysis, Routine w reflex microscopic (not at [facility])  Result Value Ref Range   Color, Urine YELLOW YELLOW   APPearance CLOUDY (A) CLEAR   Specific  Gravity, Urine 1.018 1.005 - 1.030   pH 5.0 5.0 - 8.0   Glucose, UA NEGATIVE NEGATIVE mg/dL   Hgb urine dipstick NEGATIVE NEGATIVE   Bilirubin Urine SMALL (A) NEGATIVE   Ketones, ur NEGATIVE NEGATIVE mg/dL   Protein, ur 914 (A) NEGATIVE mg/dL   Urobilinogen, UA 0.2 0.0 - 1.0 mg/dL   Nitrite NEGATIVE NEGATIVE   Leukocytes, UA NEGATIVE NEGATIVE  Comprehensive metabolic panel  Result Value Ref Range   Sodium 142 135 - 145 mmol/L   Potassium 4.6 3.5 - 5.1 mmol/L   Chloride 96 (L) 101 - 111 mmol/L   CO2 37 (H) 22 - 32 mmol/L   Glucose, Bld 133 (H) 65 - 99 mg/dL   BUN 34 (H) 6 - 20 mg/dL   Creatinine, Ser 7.82 (H) 0.44 - 1.00 mg/dL   Calcium 9.3 8.9 - 95.6 mg/dL   Total Protein 7.3 6.5 - 8.1 g/dL   Albumin 3.9 3.5 - 5.0 g/dL   AST 54 (H) 15 - 41 U/L   ALT 28 14 - 54 U/L   Alkaline Phosphatase 70 38 - 126 U/L   Total Bilirubin 0.4 0.3 - 1.2 mg/dL   GFR calc non Af Amer 19 (L) >60 mL/min   GFR calc Af Amer 22 (L) >60 mL/min   Anion gap 9 5 - 15  Lipase, blood  Result Value Ref Range   Lipase 24 22 - 51 U/L  Troponin I  Result Value Ref Range   Troponin I 0.03 <0.031 ng/mL  Lactic acid, plasma  Result Value Ref Range   Lactic Acid, Venous 1.7 0.5 - 2.0 mmol/L  CBC with Differential  Result Value Ref Range   WBC 9.2 4.0 - 10.5 K/uL   RBC 3.65 (L) 3.87 - 5.11 MIL/uL   Hemoglobin 11.2 (L) 12.0 - 15.0 g/dL   HCT 21.3 (L) 08.6 - 57.8 %   MCV 98.4 78.0 - 100.0 fL   MCH 30.7 26.0 - 34.0 pg   MCHC 31.2 30.0 - 36.0 g/dL   RDW 46.9 62.9 - 52.8 %   Platelets 151 150 - 400 K/uL   Neutrophils Relative % 87 (H) 43 - 77 %   Neutro Abs 8.0 (H) 1.7 - 7.7 K/uL   Lymphocytes Relative 8 (L) 12 - 46 %   Lymphs Abs 0.8 0.7 - 4.0 K/uL   Monocytes Relative 5 3 - 12 %   Monocytes Absolute 0.4 0.1 - 1.0 K/uL   Eosinophils Relative 0 0 - 5 %   Eosinophils Absolute 0.0 0.0 - 0.7 K/uL   Basophils  Relative 0 0 - 1 %   Basophils Absolute 0.0 0.0 - 0.1 K/uL  Urine microscopic-add on  Result Value  Ref Range   Squamous Epithelial / LPF RARE RARE   WBC, UA 3-6 <3 WBC/hpf   Bacteria, UA MANY (A) RARE   Casts HYALINE CASTS (A) NEGATIVE  Blood gas, arterial  Result Value Ref Range   O2 Content 2.0 L/min   Delivery systems NASAL CANNULA    pH, Arterial 7.201 (L) 7.350 - 7.450   pCO2 arterial 111 (HH) 35.0 - 45.0 mmHg   pO2, Arterial 78.9 (L) 80.0 - 100.0 mmHg   Bicarbonate 41.8 (H) 20.0 - 24.0 mEq/L   TCO2 40.4 0 - 100 mmol/L   Acid-Base Excess 10.8 (H) 0.0 - 2.0 mmol/L   O2 Saturation 93.9 %   Patient temperature 98.6    Collection site BRACHIAL ARTERY    Drawn by [id number]    Sample type ARTERIAL    Dg Chest 2 View 06/26/2015   CLINICAL DATA:  Hypoxia.  Altered mental status.  EXAM: CHEST  2 VIEW  COMPARISON:  None.  FINDINGS: Moderate cardiomegaly and pulmonary vascular congestion are noted. No evidence of frank pulmonary edema or pulmonary consolidation. No evidence of pleural effusion. Bilateral parenchymal scarring noted with surgical staples in the right midlung field.  IMPRESSION: Cardiomegaly and pulmonary vascular congestion.  No acute findings.   Electronically Signed   By: Myles Rosenthal M.D.   On: 06/26/2015 11:53   Ct Head Wo Contrast 06/26/2015   CLINICAL DATA:  Acute mental status changes today.  EXAM: CT HEAD WITHOUT CONTRAST  TECHNIQUE: Contiguous axial images were obtained from the base of the skull through the vertex without intravenous contrast.  COMPARISON:  None available.  FINDINGS: Stable age related cerebral atrophy, ventriculomegaly and periventricular white matter disease. Benign-appearing bilateral basal ganglia calcifications. No extra-axial fluid collections are identified. No CT findings for acute hemispheric infarction or intracranial hemorrhage. No mass lesions. The brainstem and cerebellum are normal.  The bony structures are intact. The paranasal sinuses and mastoid air cells are clear. The globes are intact. Moderate atherosclerotic calcifications are noted.   IMPRESSION: Age related cerebral atrophy, ventriculomegaly and periventricular white matter disease. No acute intracranial findings or mass lesions.   Electronically Signed   By: Rudie Meyer M.D.   On: 06/26/2015 11:44      1045:  SBP dropped to 70's. 2nd IVF bolus ordered. 2nd IV saline lock placed.   1210:  SBP 120's after IVF bolus. Sats remain 99% on O2 2L N/C. ARF on labs; IVF continues. ED RN called me back into the exam room, states pt is no longer speaking with her. Pt is awake and alert, eyes open, will nod head yes/no (appropriately) to questions, no facial droop, grips equal, strength 5/5 equal bilat UE's and LE's. Pt follows commands correctly. Pt appears to start to speak, but does not. Pt will write "hello" on a piece of paper without difficulty. Code Stroke called. ABG ordered.   1225:  T/C to [facility] Neuro Dr. Thad Ranger, case discussed, including:  HPI, pertinent PM/SHx, VS/PE, dx testing, ED course and treatment:  Given only symptom is aphasia, less likely stroke, obtain CT-A head/neck if GFR high enough, otherwise MRI brain without contrast, admit to medicine service. Rads MD confirms pt's GFR is too low for any IV contrast (CT or MRI).   1245:  ABG appears to be chronic/compensated resp acidosis. Pt is now starting to speak few clear words.  Will start bipap and re-check ABG.  T/C to [facility] Dr. Susie Cassette, case discussed, including:  HPI, pertinent PM/SHx, VS/PE, dx testing, ED course and treatment:  Agreeable to admit, requests to write temporary orders, obtain stepdown bed to team [facility].   Samuel Jester, DO 06/28/15 1422

## 2015-06-26 NOTE — ED Notes (Signed)
Bed: WA09 Expected date:  Expected time:  Means of arrival:  Comments: EMS female low O2 sats

## 2015-06-26 NOTE — ED Notes (Signed)
Unable to obtain blood sample

## 2015-06-27 LAB — BLOOD GAS, ARTERIAL
ACID-BASE EXCESS: 10.3 mmol/L — AB (ref 0.0–2.0)
Bicarbonate: 36.7 mEq/L — ABNORMAL HIGH (ref 20.0–24.0)
DELIVERY SYSTEMS: POSITIVE
Expiratory PAP: 6
FIO2: 0.4
INSPIRATORY PAP: 18
MODE: POSITIVE
O2 SAT: 97.6 %
PH ART: 7.384 (ref 7.350–7.450)
Patient temperature: 98.6
TCO2: 34.2 mmol/L (ref 0–100)
pCO2 arterial: 62.8 mmHg (ref 35.0–45.0)
pO2, Arterial: 95.1 mmHg (ref 80.0–100.0)

## 2015-06-27 LAB — CBC
HEMATOCRIT: 33.7 % — AB (ref 36.0–46.0)
Hemoglobin: 10.4 g/dL — ABNORMAL LOW (ref 12.0–15.0)
MCH: 30 pg (ref 26.0–34.0)
MCHC: 30.9 g/dL (ref 30.0–36.0)
MCV: 97.1 fL (ref 78.0–100.0)
Platelets: 133 10*3/uL — ABNORMAL LOW (ref 150–400)
RBC: 3.47 MIL/uL — ABNORMAL LOW (ref 3.87–5.11)
RDW: 13.8 % (ref 11.5–15.5)
WBC: 5.1 10*3/uL (ref 4.0–10.5)

## 2015-06-27 LAB — COMPREHENSIVE METABOLIC PANEL
ALBUMIN: 3.5 g/dL (ref 3.5–5.0)
ALT: 23 U/L (ref 14–54)
ANION GAP: 3 — AB (ref 5–15)
AST: 28 U/L (ref 15–41)
Alkaline Phosphatase: 62 U/L (ref 38–126)
BILIRUBIN TOTAL: 0.8 mg/dL (ref 0.3–1.2)
BUN: 35 mg/dL — ABNORMAL HIGH (ref 6–20)
CO2: 41 mmol/L — ABNORMAL HIGH (ref 22–32)
Calcium: 9.1 mg/dL (ref 8.9–10.3)
Chloride: 99 mmol/L — ABNORMAL LOW (ref 101–111)
Creatinine, Ser: 2.07 mg/dL — ABNORMAL HIGH (ref 0.44–1.00)
GFR calc Af Amer: 24 mL/min — ABNORMAL LOW (ref >60)
GFR, EST NON AFRICAN AMERICAN: 20 mL/min — AB (ref >60)
GLUCOSE: 113 mg/dL — AB (ref 65–99)
POTASSIUM: 5.1 mmol/L (ref 3.5–5.1)
Sodium: 143 mmol/L (ref 135–145)
TOTAL PROTEIN: 6.7 g/dL (ref 6.5–8.1)

## 2015-06-27 LAB — HEMOGLOBIN A1C
HEMOGLOBIN A1C: 4.9 % (ref 4.8–5.6)
MEAN PLASMA GLUCOSE: 94 mg/dL

## 2015-06-27 LAB — GLUCOSE, CAPILLARY
GLUCOSE-CAPILLARY: 99 mg/dL (ref 65–99)
GLUCOSE-CAPILLARY: 95 mg/dL (ref 65–99)
Glucose-Capillary: 73 mg/dL (ref 65–99)
Glucose-Capillary: 196 mg/dL — ABNORMAL HIGH (ref 65–99)
Glucose-Capillary: 183 mg/dL — ABNORMAL HIGH (ref 65–99)
Glucose-Capillary: 167 mg/dL — ABNORMAL HIGH (ref 65–99)

## 2015-06-27 LAB — TROPONIN I: Troponin I: 0.09 ng/mL — ABNORMAL HIGH (ref <0.031)

## 2015-06-27 MED ORDER — METHYLPREDNISOLONE SODIUM SUCC 125 MG IJ SOLR
80.0000 mg | Freq: Three times a day (TID) | INTRAMUSCULAR | Status: DC
  Administered 2015-06-27 – 2015-06-28 (×4): 80 mg via INTRAVENOUS
  Filled 2015-06-27 (×4): qty 2

## 2015-06-27 MED ORDER — CETYLPYRIDINIUM CHLORIDE 0.05 % MT LIQD
7.0000 mL | Freq: Two times a day (BID) | OROMUCOSAL | Status: DC
  Administered 2015-06-27 – 2015-07-02 (×10): 7 mL via OROMUCOSAL

## 2015-06-27 NOTE — Evaluation (Signed)
Clinical/Bedside Swallow Evaluation Patient Details  Name: Andrea Weber MRN: [id number] Date of Birth: Aug 01, 1926  Today's Date: 06/27/2015 Time: SLP Start Time (ACUTE ONLY): 1549 SLP Stop Time (ACUTE ONLY): 1607 SLP Time Calculation (min) (ACUTE ONLY): 18 min  Past Medical History:  Past Medical History  Diagnosis Date  . Subarachnoid hemorrhage   . Asthma   . Hypertension    Past Surgical History:  Past Surgical History  Procedure Laterality Date  . Lung surgery    . Abdominal hysterectomy     HPI:  Pt is an 81 year old female resident of assisted living, comes in with confusion, generalized weakness, worsening shortness of breath. . Patient was found to be hypoxic, 80% on room air, she quickly declined requiring BiPAP support. ABG showed pH of 7.2, PCO2 of 111, bicarbonate 37. Patient also found to be in acute on chronic renal failure. Patient is a poor historian and unable to provide any meaningful history. CT unremarkable for acute intracranial findinga with Age related cerebral atrophy, ventriculomegaly and periventricular   Assessment / Plan / Recommendation Clinical Impression  Pts swallow appearing functional without overt signs or symptoms of aspiration with thin, puree, and soild PO. Noted throat clear following thin liquid by straw.  Pt reports baseline diet consumption of soft foods secondary to poor toleration of solids with upper and lower dentures. Recommend dysphagia 3 (mechanical soft) and thin liquid diet. No Straws, Medicines whole with thin liquid by cup. Full supervision with PO as pt with decreased respiratory support. Noted increased respiratory rate during BSE x1 up to 35. ST follow up warranted.     Aspiration Risk  Mild    Diet Recommendation Dysphagia 3 (Mech soft);Thin   Medication Administration: Whole meds with liquid Compensations: Minimize environmental distractions;Slow rate;Small sips/bites;Follow solids with liquid    Other   Recommendations Oral Care Recommendations: Oral care BID   Follow Up Recommendations       Frequency and Duration min 1 x/week  1 week   Pertinent Vitals/Pain     SLP Swallow Goals     Swallow Study Prior Functional Status       General Date of Onset: 06/26/15 Other Pertinent Information: Pt is an 81 year old female resident of assisted living, comes in with confusion, generalized weakness, worsening shortness of breath. . Patient was found to be hypoxic, 80% on room air, she quickly declined requiring BiPAP support. ABG showed pH of 7.2, PCO2 of 111, bicarbonate 37. Patient also found to be in acute on chronic renal failure. Patient is a poor historian and unable to provide any meaningful history. CT unremarkable for acute intracranial findinga with Age related cerebral atrophy, ventriculomegaly and periventricular Type of Study: Bedside swallow evaluation Diet Prior to this Study: Thin liquids Temperature Spikes Noted: No Respiratory Status: Supplemental O2 delivered via (comment) Behavior/Cognition: Alert;Cooperative;Pleasant mood Oral Cavity - Dentition: Edentulous (upper and lower dentures ) Self-Feeding Abilities: Able to feed self Patient Positioning: Upright in bed Baseline Vocal Quality: Normal;Low vocal intensity Volitional Cough: Strong Volitional Swallow: Able to elicit    Oral/Motor/Sensory Function Overall Oral Motor/Sensory Function: Appears within functional limits for tasks assessed Labial ROM: Within Functional Limits Labial Symmetry: Within Functional Limits Labial Strength: Within Functional Limits Lingual ROM: Within Functional Limits Lingual Strength: Within Functional Limits Facial Symmetry: Within Functional Limits Facial Sensation: Within Functional Limits   Ice Chips Ice chips: Within functional limits   Thin Liquid Thin Liquid: Within functional limits (throat clear by straw only ) Presentation: Straw;Cup  Nectar Thick Nectar Thick Liquid: Not  tested   Honey Thick Honey Thick Liquid: Not tested   Puree Puree: Within functional limits   Solid   GO    Marcene Duos MA, CCC-SLP Acute Care Speech Language Pathologist    Solid: Within functional limits (unable to tolerate super solid PO, baseline eats soft PO )       [place], Jsean Taussig E 06/27/2015,4:16 PM

## 2015-06-27 NOTE — H&P (Addendum)
[facility] PROGRESS NOTE  Andrea Weber ZOX:[id number] DOB: [birth year] DOA: 06/26/2015 PCP: [name], MD  Assessment/Plan: Active Problems:   Altered mental status   Acute respiratory failure with hypercapnia    Altered mental status/toxic metabolic encephalopathy-likely secondary to hypercarbic hypoxemic respiratory failure, patient probably encephalopathic also secondary to acute renal failure, treat underlying causes, CT negative, hold off on MRI to rule out CVA until the patient is more stable. Patient is definitely more alert and awake today. ABG shows significant improvement in her PCO2 from being unmeasurable down to 62.8.    Acute respiratory failure with hypercapnia-continue BiPAP daily at bedtime and while sleeping. Suspect that this is likely secondary to COPD exacerbation, continue high-dose steroids.  repeat ABG again this evening. likely has underlying COPD, continue empiric antibiotics  . Speech therapy evaluation to rule out aspiration    Acute renal failure, baseline creatinine unknown, continue normal saline, patient has elevated bicarbonate probably compensation from a respiratory acidosis and appears to be chronic. Follow renal function   Diarrhea-order GI pathogen panel, enteric precautions, C. difficile PCR,  no significant diarrhea since admission,  Start the patient on a clear liquid diet  Code Status:      Code Status Orders        Start     Ordered   06/26/15 1413  Do not attempt resuscitation (DNR)   Continuous    Question Answer Comment  In the event of cardiac or respiratory ARREST Do not call a "code blue"   In the event of cardiac or respiratory ARREST Do not perform Intubation, CPR, defibrillation or ACLS   In the event of cardiac or respiratory ARREST Use medication by any route, position, wound care, and other measures to relive pain and suffering. May use oxygen, suction and manual treatment of airway obstruction as  needed for comfort.      06/26/15 1412    Advance Directive Documentation        Most Recent Value   Type of Advance Directive  Out of facility DNR (pink MOST or yellow form)   Pre-existing out of facility DNR order (yellow form or pink MOST form)  Yellow form placed in chart (order not valid for inpatient use)   "MOST" Form in Place?       Family Communication: family updated about patient's clinical progress Disposition Plan:  Continue on step down   Brief narrative: HPI:  81 year old female resident of assisted living, comes in with confusion, generalized weakness, worsening shortness of breath. Patient has had diarrhea since Monday. Last episode of diarrhea was prior to coming in. Patient was found to be hypoxic, 80% on room air, he quickly declined requiring BiPAP support. ABG showed pH of 7.2, PCO2 of 111, bicarbonate 37. Patient also found to be in acute on chronic renal failure. Patient is a poor historian and unable to provide any meaningful history. Most of the history is compiled from the patient's chart. Patient apparently also had an episode of becoming aphasic for several minutes in the ER with her eyes open, no seizure activity was noted, no focal motor weakness was noted, patient started talking after a few minutes and was apparently back to baseline. She did not have any episodes of diarrhea in the ER.   Consultants:  None  Procedures:  BiPAP  Antibiotics: Anti-infectives    Start     Dose/Rate Route Frequency Ordered Stop   06/26/15 1500  piperacillin-tazobactam (ZOSYN) IVPB 2.25 g  2.25 g 100 mL/hr over 30 Minutes Intravenous Every 6 hours 06/26/15 1429           HPI/Subjective: Patient is more awake and alert, denies any chest pain or shortness of breath  Objective: Filed Vitals:   06/27/15 0800 06/27/15 0801 06/27/15 0925 06/27/15 1100  BP:   169/80   Pulse:   80   Temp:  97.6 F (36.4 C)  98 F (36.7 C)  TempSrc:  Axillary  Oral  Resp:   27 26   Height:      Weight:      SpO2: 99% 99% 97%     Intake/Output Summary (Last 24 hours) at 06/27/15 1125 Last data filed at 06/27/15 0909  Gross per 24 hour  Intake   2185 ml  Output     50 ml  Net   2135 ml    Exam:  General: No acute respiratory distress Lungs: Clear to auscultation bilaterally without wheezes or crackles Cardiovascular: Regular rate and rhythm without murmur gallop or rub normal S1 and S2 Abdomen: Nontender, nondistended, soft, bowel sounds positive, no rebound, no ascites, no appreciable mass Extremities: No significant cyanosis, clubbing, or edema bilateral lower extremities     Data Review   Micro Results Recent Results (from the past 240 hour(s))  MRSA PCR Screening     Status: None   Collection Time: 06/26/15  2:37 PM  Result Value Ref Range Status   MRSA by PCR NEGATIVE NEGATIVE Final    Comment:        The GeneXpert MRSA Assay (FDA approved for NASAL specimens only), is one component of a comprehensive MRSA colonization surveillance program. It is not intended to diagnose MRSA infection nor to guide or monitor treatment for MRSA infections.   Culture, blood (routine x 2)     Status: None (Preliminary result)   Collection Time: 06/26/15  3:20 PM  Result Value Ref Range Status   Specimen Description BLOOD [facility]  Final   Special Requests IN PEDIATRIC BOTTLE 3 CC  Final   Culture PENDING  Incomplete   Report Status PENDING  Incomplete  Culture, blood (routine x 2)     Status: None (Preliminary result)   Collection Time: 06/26/15  3:25 PM  Result Value Ref Range Status   Specimen Description BLOOD LEFT WRIST  Final   Special Requests IN PEDIATRIC BOTTLE 2 CC  Final   Culture PENDING  Incomplete   Report Status PENDING  Incomplete    Radiology Reports Dg Chest 2 View  06/26/2015   CLINICAL DATA:  Hypoxia.  Altered mental status.  EXAM: CHEST  2 VIEW  COMPARISON:  None.  FINDINGS: Moderate cardiomegaly and pulmonary vascular  congestion are noted. No evidence of frank pulmonary edema or pulmonary consolidation. No evidence of pleural effusion. Bilateral parenchymal scarring noted with surgical staples in the right midlung field.  IMPRESSION: Cardiomegaly and pulmonary vascular congestion.  No acute findings.   Electronically Signed   By: Myles Rosenthal M.D.   On: 06/26/2015 11:53   Ct Head Wo Contrast  06/26/2015   CLINICAL DATA:  Acute mental status changes today.  EXAM: CT HEAD WITHOUT CONTRAST  TECHNIQUE: Contiguous axial images were obtained from the base of the skull through the vertex without intravenous contrast.  COMPARISON:  None available.  FINDINGS: Stable age related cerebral atrophy, ventriculomegaly and periventricular white matter disease. Benign-appearing bilateral basal ganglia calcifications. No extra-axial fluid collections are identified. No CT findings for acute hemispheric infarction or  intracranial hemorrhage. No mass lesions. The brainstem and cerebellum are normal.  The bony structures are intact. The paranasal sinuses and mastoid air cells are clear. The globes are intact. Moderate atherosclerotic calcifications are noted.  IMPRESSION: Age related cerebral atrophy, ventriculomegaly and periventricular white matter disease. No acute intracranial findings or mass lesions.   Electronically Signed   By: Rudie Meyer M.D.   On: 06/26/2015 11:44     CBC  Recent Labs Lab 06/26/15 1035 06/26/15 1520 06/27/15 0135  WBC 9.2 6.8 5.1  HGB 11.2* 11.4* 10.4*  HCT 35.9* 36.0 33.7*  PLT 151 146* 133*  MCV 98.4 98.4 97.1  MCH 30.7 31.1 30.0  MCHC 31.2 31.7 30.9  RDW 13.9 14.1 13.8  LYMPHSABS 0.8  --   --   MONOABS 0.4  --   --   EOSABS 0.0  --   --   BASOSABS 0.0  --   --     Chemistries   Recent Labs Lab 06/26/15 1035 06/26/15 1520 06/27/15 0135  NA 142  --  143  K 4.6  --  5.1  CL 96*  --  99*  CO2 37*  --  41*  GLUCOSE 133*  --  113*  BUN 34*  --  35*  CREATININE 2.22* 2.11* 2.07*   CALCIUM 9.3  --  9.1  MG  --  2.2  --   AST 54* 44* 28  ALT 28 28 23   ALKPHOS 70 76 62  BILITOT 0.4 0.4 0.8   ------------------------------------------------------------------------------------------------------------------ estimated creatinine clearance is 17.2 mL/min (by C-G formula based on Cr of 2.07). ------------------------------------------------------------------------------------------------------------------  Recent Labs  06/26/15 1520  HGBA1C 4.9   ------------------------------------------------------------------------------------------------------------------ No results for input(s): CHOL, HDL, LDLCALC, TRIG, CHOLHDL, LDLDIRECT in the last 72 hours. ------------------------------------------------------------------------------------------------------------------  Recent Labs  06/26/15 1520  TSH 0.626   ------------------------------------------------------------------------------------------------------------------ No results for input(s): VITAMINB12, FOLATE, FERRITIN, TIBC, IRON, RETICCTPCT in the last 72 hours.  Coagulation profile No results for input(s): INR, PROTIME in the last 168 hours.  No results for input(s): DDIMER in the last 72 hours.  Cardiac Enzymes  Recent Labs Lab 06/26/15 1520 06/26/15 1950 06/27/15 0135  TROPONINI 0.10* 0.10* 0.09*   ------------------------------------------------------------------------------------------------------------------ Invalid input(s): POCBNP   CBG:  Recent Labs Lab 06/26/15 1636 06/26/15 2031 06/27/15 0326 06/27/15 0810  GLUCAP 77 107* 99 95       Studies: Dg Chest 2 View  06/26/2015   CLINICAL DATA:  Hypoxia.  Altered mental status.  EXAM: CHEST  2 VIEW  COMPARISON:  None.  FINDINGS: Moderate cardiomegaly and pulmonary vascular congestion are noted. No evidence of frank pulmonary edema or pulmonary consolidation. No evidence of pleural effusion. Bilateral parenchymal scarring noted with  surgical staples in the right midlung field.  IMPRESSION: Cardiomegaly and pulmonary vascular congestion.  No acute findings.   Electronically Signed   By: Myles Rosenthal M.D.   On: 06/26/2015 11:53   Ct Head Wo Contrast  06/26/2015   CLINICAL DATA:  Acute mental status changes today.  EXAM: CT HEAD WITHOUT CONTRAST  TECHNIQUE: Contiguous axial images were obtained from the base of the skull through the vertex without intravenous contrast.  COMPARISON:  None available.  FINDINGS: Stable age related cerebral atrophy, ventriculomegaly and periventricular white matter disease. Benign-appearing bilateral basal ganglia calcifications. No extra-axial fluid collections are identified. No CT findings for acute hemispheric infarction or intracranial hemorrhage. No mass lesions. The brainstem and cerebellum are normal.  The bony structures are intact. The paranasal sinuses  and mastoid air cells are clear. The globes are intact. Moderate atherosclerotic calcifications are noted.  IMPRESSION: Age related cerebral atrophy, ventriculomegaly and periventricular white matter disease. No acute intracranial findings or mass lesions.   Electronically Signed   By: Rudie Meyer M.D.   On: 06/26/2015 11:44      Lab Results  Component Value Date   HGBA1C 4.9 06/26/2015   Lab Results  Component Value Date   CREATININE 2.07* 06/27/2015       Scheduled Meds: . antiseptic oral rinse  7 mL Mouth Rinse q12n4p  . aspirin  300 mg Rectal Daily  . chlorhexidine  15 mL Mouth Rinse BID  . heparin  5,000 Units Subcutaneous 3 times per day  . insulin aspart  0-9 Units Subcutaneous 6 times per day  . ipratropium  0.5 mg Nebulization Q6H  . methylPREDNISolone (SOLU-MEDROL) injection  80 mg Intravenous Q8H  . mometasone-formoterol  2 puff Inhalation BID  . piperacillin-tazobactam (ZOSYN)  IV  2.25 g Intravenous Q6H  . sodium chloride  3 mL Intravenous Q12H   Continuous Infusions: . sodium chloride 75 mL/hr at 06/27/15 0909     Active Problems:   Altered mental status   Acute respiratory failure with hypercapnia    Time spent: 45 minutes   [facility]  [facility] Pager [phone]. If 7PM-7AM, please contact night-coverage at [email or web address], password [facility] 06/27/2015, 11:25 AM  LOS: 1 day

## 2015-06-28 LAB — CBC
HEMATOCRIT: 35.5 % — AB (ref 36.0–46.0)
HEMOGLOBIN: 11.4 g/dL — AB (ref 12.0–15.0)
MCH: 29.8 pg (ref 26.0–34.0)
MCHC: 32.1 g/dL (ref 30.0–36.0)
MCV: 92.9 fL (ref 78.0–100.0)
Platelets: 166 10*3/uL (ref 150–400)
RBC: 3.82 MIL/uL — AB (ref 3.87–5.11)
RDW: 13.8 % (ref 11.5–15.5)
WBC: 12.1 10*3/uL — ABNORMAL HIGH (ref 4.0–10.5)

## 2015-06-28 LAB — BLOOD GAS, ARTERIAL
ACID-BASE EXCESS: 9.8 mmol/L — AB (ref 0.0–2.0)
Bicarbonate: 34.1 mEq/L — ABNORMAL HIGH (ref 20.0–24.0)
Drawn by: 276051
O2 CONTENT: 2 L/min
O2 SAT: 95.2 %
PATIENT TEMPERATURE: 98.6
PCO2 ART: 45.3 mmHg — AB (ref 35.0–45.0)
PO2 ART: 70.7 mmHg — AB (ref 80.0–100.0)
TCO2: 30.6 mmol/L (ref 0–100)
pH, Arterial: 7.489 — ABNORMAL HIGH (ref 7.350–7.450)

## 2015-06-28 LAB — GLUCOSE, CAPILLARY
GLUCOSE-CAPILLARY: 156 mg/dL — AB (ref 65–99)
GLUCOSE-CAPILLARY: 97 mg/dL (ref 65–99)
Glucose-Capillary: 118 mg/dL — ABNORMAL HIGH (ref 65–99)
Glucose-Capillary: 123 mg/dL — ABNORMAL HIGH (ref 65–99)
Glucose-Capillary: 130 mg/dL — ABNORMAL HIGH (ref 65–99)
Glucose-Capillary: 191 mg/dL — ABNORMAL HIGH (ref 65–99)

## 2015-06-28 LAB — COMPREHENSIVE METABOLIC PANEL
ALBUMIN: 3.5 g/dL (ref 3.5–5.0)
ALK PHOS: 63 U/L (ref 38–126)
ALT: 18 U/L (ref 14–54)
AST: 22 U/L (ref 15–41)
Anion gap: 6 (ref 5–15)
BILIRUBIN TOTAL: 0.5 mg/dL (ref 0.3–1.2)
BUN: 44 mg/dL — ABNORMAL HIGH (ref 6–20)
CALCIUM: 9.5 mg/dL (ref 8.9–10.3)
CO2: 35 mmol/L — AB (ref 22–32)
CREATININE: 1.92 mg/dL — AB (ref 0.44–1.00)
Chloride: 101 mmol/L (ref 101–111)
GFR calc non Af Amer: 22 mL/min — ABNORMAL LOW (ref >60)
GFR, EST AFRICAN AMERICAN: 26 mL/min — AB (ref >60)
GLUCOSE: 129 mg/dL — AB (ref 65–99)
Potassium: 4.6 mmol/L (ref 3.5–5.1)
SODIUM: 142 mmol/L (ref 135–145)
Total Protein: 7.1 g/dL (ref 6.5–8.1)

## 2015-06-28 MED ORDER — HYDRALAZINE HCL 20 MG/ML IJ SOLN
10.0000 mg | Freq: Once | INTRAMUSCULAR | Status: AC
Start: 2015-06-28 — End: 2015-06-28
  Administered 2015-06-28: 10 mg via INTRAVENOUS
  Filled 2015-06-28: qty 1

## 2015-06-28 MED ORDER — METHYLPREDNISOLONE SODIUM SUCC 40 MG IJ SOLR
40.0000 mg | Freq: Three times a day (TID) | INTRAMUSCULAR | Status: DC
  Administered 2015-06-28 – 2015-06-30 (×6): 40 mg via INTRAVENOUS
  Filled 2015-06-28 (×6): qty 1

## 2015-06-28 MED ORDER — IPRATROPIUM-ALBUTEROL 0.5-2.5 (3) MG/3ML IN SOLN
3.0000 mL | Freq: Three times a day (TID) | RESPIRATORY_TRACT | Status: DC
  Administered 2015-06-28 – 2015-07-01 (×10): 3 mL via RESPIRATORY_TRACT
  Filled 2015-06-28 (×10): qty 3

## 2015-06-28 MED ORDER — QUETIAPINE FUMARATE 25 MG PO TABS
25.0000 mg | ORAL_TABLET | Freq: Every day | ORAL | Status: DC
  Administered 2015-06-28 – 2015-07-02 (×5): 25 mg via ORAL
  Filled 2015-06-28 (×5): qty 1

## 2015-06-28 MED ORDER — HYDRALAZINE HCL 20 MG/ML IJ SOLN
10.0000 mg | Freq: Four times a day (QID) | INTRAMUSCULAR | Status: DC | PRN
  Administered 2015-06-29: 10 mg via INTRAVENOUS
  Filled 2015-06-28: qty 1

## 2015-06-28 MED ORDER — LORAZEPAM 2 MG/ML IJ SOLN
1.0000 mg | Freq: Once | INTRAMUSCULAR | Status: AC
  Administered 2015-06-28: 1 mg via INTRAVENOUS
  Filled 2015-06-28: qty 1

## 2015-06-28 NOTE — Progress Notes (Signed)
Bed alarm going off writer entered room and found patient attempting to get out of bed.  Patient states she is leaving and could I push her out. Patient unable to explain to writer where she wanted to go, states she just needs to get out of here. Patient alert to self only at this time.  Notified Dr. Susie Cassette concerning patients change in  behavior new orders received.  Will continue to monitor

## 2015-06-28 NOTE — Progress Notes (Signed)
Utilization review completed.  

## 2015-06-28 NOTE — Progress Notes (Signed)
Ms Geigle requested the chaplain to return for a visit. Conversation included her spiritual grounding, her missing her favorite spiritual programs on radio and her fears about her current illness. Ms Hoskie is deeply religious and her world view and decision making is rooting in her faith grounding. These are indivisible.   Ms Singleterry is a [place] and a member of the [employer]. She was pleased to be able to talk with a chaplain about things she feels other staff members may not understand. For this reason it is advisable to page a chaplain whenever she feels the need to exercise her spiritual side.   Ms Ibsen requests follow up visits by chaplain when possible.  Benjie Karvonen. Tiyanna Larcom, DMin Chaplain

## 2015-06-28 NOTE — Progress Notes (Signed)
Chaplain paged at the request of Ms Melchior. When chaplain arrived she changed her mind and decided she wished to mediate alone.   Ms Pincus was alert and clear concerning her change of mind concerning a chaplain visit.  Chaplains available should Ms Riviera need or desire spiritual care.  Benjie Karvonen. Dev Dhondt, DMin Chaplain

## 2015-06-28 NOTE — Progress Notes (Signed)
[facility] PROGRESS NOTE  Andrea Weber WUJ:[id number] DOB: Dec 23, 1925 DOA: 06/26/2015 PCP: [name], MD  Assessment/Plan: Active Problems:   Altered mental status   Acute respiratory failure with hypercapnia    Altered mental status/toxic metabolic encephalopathy-likely secondary to hypercarbic hypoxemic respiratory failure, patient probably encephalopathic also secondary to acute renal failure, treat underlying causes, CT head negative, hold off on MRI to rule out CVA until the patient is more stable. Patient is definitely more alert and awake today. ABG shows significant improvement in her PCO2 which is down to 45.3   Acute respiratory failure with hypercapnia-continue BiPAP daily at bedtime and while sleeping. Suspect that this is likely secondary to COPD exacerbation, continue high-dose steroids.  repeat ABG again this evening. likely has underlying COPD, continue empiric antibiotics  . Speech therapy evaluation to rule out aspiration. Patient also has some evidence of troponin leak on her cardiac enzyme profile, however his cardiac enzymes have been flat in the setting of mild renal insufficiency. Continue antibiotics IV steroids,   Acute renal failure, baseline creatinine unknown, continue normal saline, patient has elevated bicarbonate probably compensation from a respiratory acidosis and appears to be chronic. Creatinine has improved from 2.22> 1.92   Diarrhea-patient has had no diarrhea to collect GI pathogen panel/C. difficile, enteric precautions will be discontinued, ,  no significant diarrhea since admission,   Speech Therapy recommends Dysphagia 3 (Mech soft);Thin , will order PT consult and try to mobilize the patient tomorrow  Code Status:      Code Status Orders        Start     Ordered   06/26/15 1413  Do not attempt resuscitation (DNR)   Continuous    Question Answer Comment  In the event of cardiac or respiratory ARREST Do not call a  "code blue"   In the event of cardiac or respiratory ARREST Do not perform Intubation, CPR, defibrillation or ACLS   In the event of cardiac or respiratory ARREST Use medication by any route, position, wound care, and other measures to relive pain and suffering. May use oxygen, suction and manual treatment of airway obstruction as needed for comfort.      06/26/15 1412    Advance Directive Documentation        Most Recent Value   Type of Advance Directive  Out of facility DNR (pink MOST or yellow form)   Pre-existing out of facility DNR order (yellow form or pink MOST form)  Yellow form placed in chart (order not valid for inpatient use)   "MOST" Form in Place?       Family Communication: family updated about patient's clinical progress Disposition Plan:  Continue on step down   Brief narrative: HPI:  81 year old female resident of assisted living, comes in with confusion, generalized weakness, worsening shortness of breath. Patient has had diarrhea since Monday. Last episode of diarrhea was prior to coming in. Patient was found to be hypoxic, 80% on room air, he quickly declined requiring BiPAP support. ABG showed pH of 7.2, PCO2 of 111, bicarbonate 37. Patient also found to be in acute on chronic renal failure. Patient is a poor historian and unable to provide any meaningful history. Most of the history is compiled from the patient's chart. Patient apparently also had an episode of becoming aphasic for several minutes in the ER with her eyes open, no seizure activity was noted, no focal motor weakness was noted, patient started talking after a few minutes  and was apparently back to baseline. She did not have any episodes of diarrhea in the ER.   Consultants:  None  Procedures:  BiPAP  Antibiotics: Anti-infectives    Start     Dose/Rate Route Frequency Ordered Stop   06/26/15 1500  piperacillin-tazobactam (ZOSYN) IVPB 2.25 g     2.25 g 100 mL/hr over 30 Minutes Intravenous Every  6 hours 06/26/15 1429           HPI/Subjective: Patient is more awake and alert, denies any chest pain or shortness of breath  Objective: Filed Vitals:   06/28/15 0556 06/28/15 0600 06/28/15 0745 06/28/15 0800  BP: 212/115 201/107 171/90 156/85  Pulse:    88  Temp:      TempSrc:      Resp: Height:      Weight:      SpO2:    98%    Intake/Output Summary (Last 24 hours) at 06/28/15 1144 Last data filed at 06/28/15 0500  Gross per 24 hour  Intake   1620 ml  Output    350 ml  Net   1270 ml    Exam:  General: No acute respiratory distress Lungs: Clear to auscultation bilaterally without wheezes or crackles Cardiovascular: Regular rate and rhythm without murmur gallop or rub normal S1 and S2 Abdomen: Nontender, nondistended, soft, bowel sounds positive, no rebound, no ascites, no appreciable mass Extremities: No significant cyanosis, clubbing, or edema bilateral lower extremities     Data Review   Micro Results Recent Results (from the past 240 hour(s))  MRSA PCR Screening     Status: None   Collection Time: 06/26/15  2:37 PM  Result Value Ref Range Status   MRSA by PCR NEGATIVE NEGATIVE Final    Comment:        The GeneXpert MRSA Assay (FDA approved for NASAL specimens only), is one component of a comprehensive MRSA colonization surveillance program. It is not intended to diagnose MRSA infection nor to guide or monitor treatment for MRSA infections.   Culture, blood (routine x 2)     Status: None (Preliminary result)   Collection Time: 06/26/15  3:20 PM  Result Value Ref Range Status   Specimen Description BLOOD [facility]  Final   Special Requests IN PEDIATRIC BOTTLE 3 CC  Final   Culture   Final    NO GROWTH < 24 HOURS Performed at [facility]    Report Status PENDING  Incomplete  Culture, blood (routine x 2)     Status: None (Preliminary result)   Collection Time: 06/26/15  3:25 PM  Result Value Ref Range Status   Specimen  Description BLOOD LEFT WRIST  Final   Special Requests IN PEDIATRIC BOTTLE 2 CC  Final   Culture   Final    NO GROWTH < 24 HOURS Performed at [facility]    Report Status PENDING  Incomplete    Radiology Reports Dg Chest 2 View  06/26/2015   CLINICAL DATA:  Hypoxia.  Altered mental status.  EXAM: CHEST  2 VIEW  COMPARISON:  None.  FINDINGS: Moderate cardiomegaly and pulmonary vascular congestion are noted. No evidence of frank pulmonary edema or pulmonary consolidation. No evidence of pleural effusion. Bilateral parenchymal scarring noted with surgical staples in the right midlung field.  IMPRESSION: Cardiomegaly and pulmonary vascular congestion.  No acute findings.   Electronically Signed   By: Myles Rosenthal M.D.   On: 06/26/2015 11:53   Ct  Head Wo Contrast  06/26/2015   CLINICAL DATA:  Acute mental status changes today.  EXAM: CT HEAD WITHOUT CONTRAST  TECHNIQUE: Contiguous axial images were obtained from the base of the skull through the vertex without intravenous contrast.  COMPARISON:  None available.  FINDINGS: Stable age related cerebral atrophy, ventriculomegaly and periventricular white matter disease. Benign-appearing bilateral basal ganglia calcifications. No extra-axial fluid collections are identified. No CT findings for acute hemispheric infarction or intracranial hemorrhage. No mass lesions. The brainstem and cerebellum are normal.  The bony structures are intact. The paranasal sinuses and mastoid air cells are clear. The globes are intact. Moderate atherosclerotic calcifications are noted.  IMPRESSION: Age related cerebral atrophy, ventriculomegaly and periventricular white matter disease. No acute intracranial findings or mass lesions.   Electronically Signed   By: Rudie Meyer M.D.   On: 06/26/2015 11:44     CBC  Recent Labs Lab 06/26/15 1035 06/26/15 1520 06/27/15 0135 06/28/15 0656  WBC 9.2 6.8 5.1 12.1*  HGB 11.2* 11.4* 10.4* 11.4*  HCT 35.9* 36.0 33.7* 35.5*   PLT 151 146* 133* 166  MCV 98.4 98.4 97.1 92.9  MCH 30.7 31.1 30.0 29.8  MCHC 31.2 31.7 30.9 32.1  RDW 13.9 14.1 13.8 13.8  LYMPHSABS 0.8  --   --   --   MONOABS 0.4  --   --   --   EOSABS 0.0  --   --   --   BASOSABS 0.0  --   --   --     Chemistries   Recent Labs Lab 06/26/15 1035 06/26/15 1520 06/27/15 0135 06/28/15 0656  NA 142  --  143 142  K 4.6  --  5.1 4.6  CL 96*  --  99* 101  CO2 37*  --  41* 35*  GLUCOSE 133*  --  113* 129*  BUN 34*  --  35* 44*  CREATININE 2.22* 2.11* 2.07* 1.92*  CALCIUM 9.3  --  9.1 9.5  MG  --  2.2  --   --   AST 54* 44* 28 22  ALT 28 28 23 18   ALKPHOS 70 76 62 63  BILITOT 0.4 0.4 0.8 0.5   ------------------------------------------------------------------------------------------------------------------ estimated creatinine clearance is 18.6 mL/min (by C-G formula based on Cr of 1.92). ------------------------------------------------------------------------------------------------------------------  Recent Labs  06/26/15 1520  HGBA1C 4.9   ------------------------------------------------------------------------------------------------------------------ No results for input(s): CHOL, HDL, LDLCALC, TRIG, CHOLHDL, LDLDIRECT in the last 72 hours. ------------------------------------------------------------------------------------------------------------------  Recent Labs  06/26/15 1520  TSH 0.626   ------------------------------------------------------------------------------------------------------------------ No results for input(s): VITAMINB12, FOLATE, FERRITIN, TIBC, IRON, RETICCTPCT in the last 72 hours.  Coagulation profile No results for input(s): INR, PROTIME in the last 168 hours.  No results for input(s): DDIMER in the last 72 hours.  Cardiac Enzymes  Recent Labs Lab 06/26/15 1520 06/26/15 1950 06/27/15 0135  TROPONINI 0.10* 0.10* 0.09*    ------------------------------------------------------------------------------------------------------------------ Invalid input(s): POCBNP   CBG:  Recent Labs Lab 06/27/15 1618 06/27/15 1936 06/27/15 2340 06/28/15 0401 06/28/15 0804  GLUCAP 183* 167* 130* 118* 123*       Studies: No results found.    Lab Results  Component Value Date   HGBA1C 4.9 06/26/2015   Lab Results  Component Value Date   CREATININE 1.92* 06/28/2015       Scheduled Meds: . antiseptic oral rinse  7 mL Mouth Rinse BID  . aspirin  300 mg Rectal Daily  . heparin  5,000 Units Subcutaneous 3 times per day  . insulin  aspart  0-9 Units Subcutaneous 6 times per day  . ipratropium-albuterol  3 mL Nebulization TID  . methylPREDNISolone (SOLU-MEDROL) injection  80 mg Intravenous Q8H  . mometasone-formoterol  2 puff Inhalation BID  . piperacillin-tazobactam (ZOSYN)  IV  2.25 g Intravenous Q6H  . sodium chloride  3 mL Intravenous Q12H   Continuous Infusions: . sodium chloride 75 mL/hr at 06/27/15 2340    Active Problems:   Altered mental status   Acute respiratory failure with hypercapnia    Time spent: 45 minutes   [facility]  [facility] Pager [phone]. If 7PM-7AM, please contact night-coverage at [email or web address], password [facility] 06/28/2015, 11:44 AM  LOS: 2 days

## 2015-06-29 ENCOUNTER — Inpatient Hospital Stay (HOSPITAL_COMMUNITY): Payer: Medicare HMO

## 2015-06-29 DIAGNOSIS — R0600 Dyspnea, unspecified: Secondary | ICD-10-CM

## 2015-06-29 LAB — GLUCOSE, CAPILLARY
Glucose-Capillary: 169 mg/dL — ABNORMAL HIGH (ref 65–99)
Glucose-Capillary: 128 mg/dL — ABNORMAL HIGH (ref 65–99)
Glucose-Capillary: 87 mg/dL (ref 65–99)
Glucose-Capillary: 97 mg/dL (ref 65–99)
Glucose-Capillary: 164 mg/dL — ABNORMAL HIGH (ref 65–99)
Glucose-Capillary: 106 mg/dL — ABNORMAL HIGH (ref 65–99)
Glucose-Capillary: 171 mg/dL — ABNORMAL HIGH (ref 65–99)

## 2015-06-29 LAB — COMPREHENSIVE METABOLIC PANEL
ALBUMIN: 3.6 g/dL (ref 3.5–5.0)
ALK PHOS: 62 U/L (ref 38–126)
ALT: 15 U/L (ref 14–54)
AST: 20 U/L (ref 15–41)
Anion gap: 6 (ref 5–15)
BILIRUBIN TOTAL: 0.7 mg/dL (ref 0.3–1.2)
BUN: 34 mg/dL — ABNORMAL HIGH (ref 6–20)
CALCIUM: 9.8 mg/dL (ref 8.9–10.3)
CO2: 33 mmol/L — AB (ref 22–32)
CREATININE: 1.64 mg/dL — AB (ref 0.44–1.00)
Chloride: 106 mmol/L (ref 101–111)
GFR calc Af Amer: 31 mL/min — ABNORMAL LOW (ref >60)
GFR calc non Af Amer: 27 mL/min — ABNORMAL LOW (ref >60)
GLUCOSE: 109 mg/dL — AB (ref 65–99)
Potassium: 4.2 mmol/L (ref 3.5–5.1)
SODIUM: 145 mmol/L (ref 135–145)
TOTAL PROTEIN: 6.9 g/dL (ref 6.5–8.1)

## 2015-06-29 LAB — MAGNESIUM: MAGNESIUM: 2.3 mg/dL (ref 1.7–2.4)

## 2015-06-29 MED ORDER — DILTIAZEM HCL 60 MG PO TABS
60.0000 mg | ORAL_TABLET | Freq: Three times a day (TID) | ORAL | Status: DC
  Administered 2015-06-29 – 2015-06-30 (×3): 60 mg via ORAL
  Filled 2015-06-29 (×3): qty 1

## 2015-06-29 MED ORDER — PIPERACILLIN-TAZOBACTAM 3.375 G IVPB
3.3750 g | Freq: Three times a day (TID) | INTRAVENOUS | Status: DC
  Administered 2015-06-29 – 2015-07-01 (×6): 3.375 g via INTRAVENOUS
  Filled 2015-06-29 (×4): qty 50

## 2015-06-29 MED ORDER — HALOPERIDOL LACTATE 5 MG/ML IJ SOLN
2.5000 mg | Freq: Once | INTRAMUSCULAR | Status: DC
Start: 2015-06-29 — End: 2015-06-30
  Filled 2015-06-29: qty 1

## 2015-06-29 NOTE — Progress Notes (Signed)
  Echocardiogram 2D Echocardiogram has been performed.  Andrea Weber 06/29/2015, 4:50 PM

## 2015-06-29 NOTE — Evaluation (Signed)
Occupational Therapy Evaluation Patient Details Name: Andrea Weber MRN: [id number] DOB: 1926-05-13 Today's Date: 06/29/2015    History of Present Illness 81 year old female resident of assisted living, comes in with confusion, generalized weakness, worsening shortness of breath   Clinical Impression   Pt admitted with confusion. Pt currently with functional limitations due to the deficits listed below (see OT Problem List).  Pt will benefit from skilled OT to increase their safety and independence with ADL and functional mobility for ADL to facilitate discharge to venue listed below.      Follow Up Recommendations  Home health OT;Other (comment);Supervision/Assistance - 24 hour (at ALF)    Equipment Recommendations  Other (comment) (TBD)       Precautions / Restrictions Precautions Precautions: Fall      Mobility Bed Mobility Overal bed mobility: Needs Assistance Bed Mobility: Supine to Sit     Supine to sit: Min assist     General bed mobility comments: extra time for activity  Transfers Overall transfer level: Needs assistance Equipment used: Rolling walker (2 wheeled) Transfers: Sit to/from [employer] Sit to Stand: Mod assist;+2 safety/equipment Stand pivot transfers: Mod assist;+2 safety/equipment       General transfer comment: cues for safety, cues  for hand placement., patient pivitted to [facility] with arm hold assist. then with RW to recliner.    Balance Overall balance assessment: Needs assistance Sitting-balance support: Feet supported;Bilateral upper extremity supported Sitting balance-Leahy Scale: Fair     Standing balance support: During functional activity;Bilateral upper extremity supported Standing balance-Leahy Scale: Poor                              ADL Overall ADL's : Needs assistance/impaired     Grooming: Sitting;Set up                   Toilet Transfer: Moderate  assistance;BSC;Stand-pivot;Cueing for sequencing;Cueing for safety;+2 for safety/equipment   Toileting- Clothing Manipulation and Hygiene: Minimal assistance;Sit to/from stand;Cueing for sequencing;Cueing for safety;+2 for safety/equipment                         Pertinent Vitals/Pain Pain Assessment: No/denies pain     Hand Dominance     Extremity/Trunk Assessment Upper Extremity Assessment Upper Extremity Assessment: Defer to OT evaluation   Lower Extremity Assessment Lower Extremity Assessment: Generalized weakness       Communication Communication Communication: No difficulties   Cognition Arousal/Alertness: Awake/alert Behavior During Therapy: WFL for tasks assessed/performed Overall Cognitive Status: Within Functional Limits for tasks assessed                                Home Living Family/patient expects to be discharged to:: Assisted living                                 Additional Comments: [facility]      Prior Functioning/Environment               OT Diagnosis: Generalized weakness   OT Problem List: Decreased strength;Decreased activity tolerance   OT Treatment/Interventions: Self-care/ADL training    OT Goals(Current goals can be found in the care plan section) Acute Rehab OT Goals Patient Stated Goal: back to ALF OT Goal Formulation: With patient Time For Goal Achievement: 07/13/15  Potential to Achieve Goals: Good  OT Frequency: Min 2X/week   Barriers to D/C:            Co-evaluation PT/OT/SLP Co-Evaluation/Treatment: Yes Reason for Co-Treatment: For patient/therapist safety PT goals addressed during session: Mobility/safety with mobility OT goals addressed during session: ADL's and self-care      End of Session Nurse Communication: Mobility status  Activity Tolerance: Patient tolerated treatment well Patient left: in chair;with call bell/phone within reach;with chair alarm set   Time:  1610-9604 OT Time Calculation (min): 23 min Charges:  OT General Charges $OT Visit: 1 Procedure OT Evaluation $Initial OT Evaluation Tier I: 1 Procedure G-Codes:    Alba Cory 07-04-15, 3:12 PM

## 2015-06-29 NOTE — Progress Notes (Signed)
Patient would not keep on her BIPAP due to not being able to wear her false teeth. RT explained to patient beforehand  that the false teeth could block her airway if she wore them with the BIPAP on. Patient understood. Shortly after RN took her off BIPAP and said she cant do without her teeth. RT will continue to monitor.

## 2015-06-29 NOTE — Progress Notes (Signed)
Speech Language Pathology Treatment: Dysphagia  Patient Details Name: Andrea Weber MRN: [id number] DOB: 06-11-1926 Today's Date: 06/29/2015 Time: 3845-3646 SLP Time Calculation (min) (ACUTE ONLY): 22 min  Assessment / Plan / Recommendation Clinical Impression  Pt today observed consuming lunch. Swallow was timely with intermittent throat clear noted after liquid swallows.  NO overt indication of airway compromise.  [name] reports her swallowing to be at baseline level.  Of note, frequent belching noted during meal, to which pt stated "Isn't that good?"  She denies symptoms of reflux. Observed pt consuming icecream, pot roast and water via straw.  Pt reports she rarely uses straw unless her morning coffee is too hot.  Recommend to lift straw restriction and educated pt to cease using if increases cough with intake.  Pt denies h/o dysphagia however due to dentures and respiratory status, recommend to continue dys3/thin diet.  Educated pt using teach back and provided written strategies. No follow up indicated at this time, thanks for this order.    HPI Other Pertinent Information: Pt is an 81 year old female resident of assisted living, comes in with confusion, generalized weakness, worsening shortness of breath. . Patient was found to be hypoxic, 80% on room air, she quickly declined requiring BiPAP support. ABG showed pH of 7.2, PCO2 of 111, bicarbonate 37. Patient also found to be in acute on chronic renal failure. Patient is a poor historian and unable to provide any meaningful history. CT unremarkable for acute intracranial findinga with Age related cerebral atrophy, ventriculomegaly and periventricular   Pertinent Vitals Pain Assessment: No/denies pain  SLP Plan  All goals met    Recommendations Medication Administration: Whole meds with liquid Supervision: Patient able to self feed Compensations: Minimize environmental distractions;Slow rate;Small sips/bites;Follow solids with  liquid Postural Changes and/or Swallow Maneuvers: Seated upright 90 degrees;Upright 30-60 min after meal              Oral Care Recommendations: Oral care BID Follow up Recommendations: None Plan: All goals met    [place], [place] [facility] SLP [phone]

## 2015-06-29 NOTE — Evaluation (Signed)
Physical Therapy Evaluation Patient Details Name: Andrea Weber MRN: [id number] DOB: 1926/01/20 Today's Date: 06/29/2015   History of Present Illness  81 year old female resident of assisted living, comes in with confusion, generalized weakness, worsening shortness of breath  Clinical Impression  Patient  mobilized to bsc and recliner. Noted RR in 40's at times with patient not appearing to be in distress, sats 96% on 2 l. Patient will benefit from PT to address problems listed in note below.     Follow Up Recommendations SNF;Home health PT;Supervision/Assistance - 24 hour (ALF will need to determine if she can be cared for there at [facility] at discharge.  Equipment Recommendations  None recommended by PT    Recommendations for Other Services       Precautions / Restrictions Precautions Precautions: Fall      Mobility  Bed Mobility Overal bed mobility: Needs Assistance Bed Mobility: Supine to Sit     Supine to sit: Min assist     General bed mobility comments: extra time for activity  Transfers Overall transfer level: Needs assistance Equipment used: Rolling walker (2 wheeled) Transfers: Sit to/from [employer] Sit to Stand: Mod assist;+2 safety/equipment Stand pivot transfers: Mod assist;+2 safety/equipment       General transfer comment: cues for safety, cues  for hand placement., patient pivitted to [facility] with arm hold assist. then with RW to recliner.  Ambulation/Gait                Stairs            Wheelchair Mobility    Modified Rankin (Stroke Patients Only)       Balance Overall balance assessment: Needs assistance Sitting-balance support: Feet supported;Bilateral upper extremity supported Sitting balance-Leahy Scale: Fair     Standing balance support: During functional activity;Bilateral upper extremity supported Standing balance-Leahy Scale: Poor                               Pertinent Vitals/Pain  Pain Assessment: No/denies pain    Home Living Family/patient expects to be discharged to:: Assisted living                 Additional Comments: [facility]    Prior Function                 Hand Dominance        Extremity/Trunk Assessment   Upper Extremity Assessment: Defer to OT evaluation           Lower Extremity Assessment: Generalized weakness         Communication   Communication: No difficulties  Cognition Arousal/Alertness: Awake/alert Behavior During Therapy: WFL for tasks assessed/performed Overall Cognitive Status: Within Functional Limits for tasks assessed                      General Comments      Exercises        Assessment/Plan    PT Assessment Patient needs continued PT services  PT Diagnosis Difficulty walking;Generalized weakness   PT Problem List Decreased strength;Decreased activity tolerance;Decreased mobility;Decreased knowledge of use of DME;Decreased cognition;Decreased safety awareness;Decreased knowledge of precautions;Cardiopulmonary status limiting activity  PT Treatment Interventions DME instruction;Gait training;Functional mobility training;Therapeutic activities;Therapeutic exercise;Patient/family education;Cognitive remediation   PT Goals (Current goals can be found in the Care Plan section) Acute Rehab PT Goals Patient Stated Goal: back to ALF PT Goal Formulation: With patient Time For Goal Achievement:  07/13/15 Potential to Achieve Goals: Good    Frequency Min 3X/week   Barriers to discharge   patient may require higher level of care.    Co-evaluation PT/OT/SLP Co-Evaluation/Treatment: Yes Reason for Co-Treatment: For patient/therapist safety PT goals addressed during session: Mobility/safety with mobility OT goals addressed during session: ADL's and self-care       End of Session   Activity Tolerance: Patient tolerated treatment well Patient left: in chair;with call bell/phone within  reach;with chair alarm set;with nursing/sitter in room Nurse Communication: Mobility status         Time: 0981-1914 PT Time Calculation (min) (ACUTE ONLY): 23 min   Charges:   PT Evaluation $Initial PT Evaluation Tier I: 1 Procedure     PT G CodesRada Hay 06/29/2015, 2:43 PM Blanchard Kelch PT [phone]

## 2015-06-29 NOTE — Progress Notes (Signed)
ANTIBIOTIC CONSULT NOTE   Pharmacy Consult for Zosyn Indication: rule out pneumonia  No Known Allergies  Patient Measurements: Height:  (165.1 cm) Weight: 144 lb 6.4 oz (65.5 kg) IBW/kg (Calculated) : 57 TBW:   Vital Signs: Temp: 97.8 F (36.6 C) (08/22 1200) Temp Source: Oral (08/22 1200) BP: 133/66 mmHg (08/22 1200) Pulse Rate: 105 (08/22 1200) Intake/Output from previous day: 08/21 0701 - 08/22 0700 In: 2075 [I.V.:1875; IV Piggyback:200] Out: 450 [Urine:450] Intake/Output from this shift: Total I/O In: 339.9 [I.V.:289.9; IV Piggyback:50] Out: 175 [Urine:175]  Labs:  Recent Labs  06/26/15 1520 06/27/15 0135 06/28/15 0656 06/29/15 0822  WBC 6.8 5.1 12.1*  --   HGB 11.4* 10.4* 11.4*  --   PLT 146* 133* 166  --   CREATININE 2.11* 2.07* 1.92* 1.64*   Estimated Creatinine Clearance: 21.7 mL/min (by C-G formula based on Cr of 1.64). No results for input(s): VANCOTROUGH, VANCOPEAK, VANCORANDOM, GENTTROUGH, GENTPEAK, GENTRANDOM, TOBRATROUGH, TOBRAPEAK, TOBRARND, AMIKACINPEAK, AMIKACINTROU, AMIKACIN in the last 72 hours.   Microbiology: Recent Results (from the past 720 hour(s))  MRSA PCR Screening     Status: None   Collection Time: 06/26/15  2:37 PM  Result Value Ref Range Status   MRSA by PCR NEGATIVE NEGATIVE Final    Comment:        The GeneXpert MRSA Assay (FDA approved for NASAL specimens only), is one component of a comprehensive MRSA colonization surveillance program. It is not intended to diagnose MRSA infection nor to guide or monitor treatment for MRSA infections.   Culture, blood (routine x 2)     Status: None (Preliminary result)   Collection Time: 06/26/15  3:20 PM  Result Value Ref Range Status   Specimen Description BLOOD [facility]  Final   Special Requests IN PEDIATRIC BOTTLE 3 CC  Final   Culture   Final    NO GROWTH 3 DAYS Performed at [facility] [place]    Report Status PENDING  Incomplete  Culture, blood (routine x 2)     Status:  None (Preliminary result)   Collection Time: 06/26/15  3:25 PM  Result Value Ref Range Status   Specimen Description BLOOD LEFT WRIST  Final   Special Requests IN PEDIATRIC BOTTLE 2 CC  Final   Culture   Final    NO GROWTH 3 DAYS Performed at [facility]    Report Status PENDING  Incomplete    Anti-infectives    Start     Dose/Rate Route Frequency Ordered Stop   06/26/15 1500  piperacillin-tazobactam (ZOSYN) IVPB 2.25 g     2.25 g 100 mL/hr over 30 Minutes Intravenous Every 6 hours 06/26/15 1429       Assessment: 89 yoF ALF resident with worsening SHOB, 4 day hx of diarrhea, in acute renal failure. Aphasic episode in ED, head CT negative. CXray: no acute lung process. Pharmacy requested to dose Zosyn.   8/19 >> Zosyn  >>    8/19 blood: NGTD CDiff ordered:  MRSA PCR: Neg   Renal: SCr 1.64 - improving  WBC elevated on steroids  afebrile  Goal of Therapy:  Appropriate antibiotic for treatment, dose/schedule appropriate for renal function  Plan:  Day #4 zosyn  Change zosyn to 3.375gm IV q8h over 4h infusion for improved renal function  Monitor renal function closely, adjust dose/schedule as needed  Suggest narrowing antibiotics for unrevealing cultures  Juliette Alcide, PharmD, BCPS.   Pager: [phone] 06/29/2015, 2:01 PM

## 2015-06-29 NOTE — Progress Notes (Signed)
[facility] PROGRESS NOTE  Andrea Weber ZOX:[id number] DOB: 06/10/1926 DOA: 06/26/2015 PCP: Andrea Jenny, MD  Assessment/Plan: Active Problems:   Altered mental status   Acute respiratory failure with hypercapnia    Altered mental status/toxic metabolic encephalopathy-likely secondary to hypercarbic hypoxemic respiratory failure, patient probably encephalopathic also secondary to acute renal failure, this is slowly improving. CT head negative,   MRI to rule out CVA as the patient has had a waxing and waning mental status since admission. Patient is definitely more alert and awake today. ABG shows significant improvement in her PCO2 which is down to 45.3. Currently off BiPAP   Acute respiratory failure with hypercapnia-continue BiPAP daily at bedtime and while sleeping. Suspect that this is likely secondary to COPD exacerbation, continue IV steroids [dose reduced yesterday].  Continue empiric antibiotics  . Speech therapy evaluation to rule out aspiration.they recommend Dysphagia 3 (Mech soft);Thin  Patient also has some evidence of troponin leak on her cardiac enzyme profile, however his cardiac enzymes have been flat in the setting of mild renal insufficiency. Continue antibiotics IV steroids,  Multifocal atrial tachycardia- patient started on by mouth Cardizem, will order 2-D echo, potassium and magnesium is okay  Acute renal failure, baseline creatinine unknown, slowly improving, continue normal saline, patient has elevated bicarbonate probably compensation from a respiratory acidosis and appears to be chronic. Creatinine has improved from 2.22> 1.92 > 1.64   Diarrhea-patient has had no diarrhea to collect GI pathogen panel/C. difficile, enteric precautions will be discontinued, ,  no significant diarrhea since admission,    PT consult and try to mobilize the patient   Code Status:      Code Status Orders        Start     Ordered   06/26/15 1413  Do not  attempt resuscitation (DNR)   Continuous    Question Answer Comment  In the event of cardiac or respiratory ARREST Do not call a "code blue"   In the event of cardiac or respiratory ARREST Do not perform Intubation, CPR, defibrillation or ACLS   In the event of cardiac or respiratory ARREST Use medication by any route, position, wound care, and other measures to relive pain and suffering. May use oxygen, suction and manual treatment of airway obstruction as needed for comfort.      06/26/15 1412    Advance Directive Documentation        Most Recent Value   Type of Advance Directive  Out of facility DNR (pink MOST or yellow form)   Pre-existing out of facility DNR order (yellow form or pink MOST form)  Yellow form placed in chart (order not valid for inpatient use)   "MOST" Form in Place?       Family Communication: family updated about patient's clinical progress Disposition Plan:  Continue on step down   Brief narrative: HPI:  81 year old female resident of assisted living, comes in with confusion, generalized weakness, worsening shortness of breath. Patient has had diarrhea since Monday. Last episode of diarrhea was prior to coming in. Patient was found to be hypoxic, 80% on room air, he quickly declined requiring BiPAP support. ABG showed pH of 7.2, PCO2 of 111, bicarbonate 37. Patient also found to be in acute on chronic renal failure. Patient is a poor historian and unable to provide any meaningful history. Most of the history is compiled from the patient's chart. Patient apparently also had an episode of becoming aphasic for several minutes in the  ER with her eyes open, no seizure activity was noted, no focal motor weakness was noted, patient started talking after a few minutes and was apparently back to baseline. She did not have any episodes of diarrhea in the ER.   Consultants:  None  Procedures:  BiPAP  Antibiotics: Anti-infectives    Start     Dose/Rate Route Frequency  Ordered Stop   06/26/15 1500  piperacillin-tazobactam (ZOSYN) IVPB 2.25 g     2.25 g 100 mL/hr over 30 Minutes Intravenous Every 6 hours 06/26/15 1429           HPI/Subjective: Patient is more awake and alert, denies any chest pain or shortness of breath  Objective: Filed Vitals:   06/29/15 0800 06/29/15 0817 06/29/15 0833 06/29/15 1007  BP:  176/131  160/76  Pulse:  106    Temp: 98.2 F (36.8 C)     TempSrc: Oral     Resp:  46  36  Height:      Weight:      SpO2:  99% 99% 94%    Intake/Output Summary (Last 24 hours) at 06/29/15 1202 Last data filed at 06/29/15 1100  Gross per 24 hour  Intake 2344.92 ml  Output    425 ml  Net 1919.92 ml    Exam:  General: No acute respiratory distress Lungs: Clear to auscultation bilaterally without wheezes or crackles Cardiovascular: Regular rate and rhythm without murmur gallop or rub normal S1 and S2 Abdomen: Nontender, nondistended, soft, bowel sounds positive, no rebound, no ascites, no appreciable mass Extremities: No significant cyanosis, clubbing, or edema bilateral lower extremities     Data Review   Micro Results Recent Results (from the past 240 hour(s))  MRSA PCR Screening     Status: None   Collection Time: 06/26/15  2:37 PM  Result Value Ref Range Status   MRSA by PCR NEGATIVE NEGATIVE Final    Comment:        The GeneXpert MRSA Assay (FDA approved for NASAL specimens only), is one component of a comprehensive MRSA colonization surveillance program. It is not intended to diagnose MRSA infection nor to guide or monitor treatment for MRSA infections.   Culture, blood (routine x 2)     Status: None (Preliminary result)   Collection Time: 06/26/15  3:20 PM  Result Value Ref Range Status   Specimen Description BLOOD [facility]  Final   Special Requests IN PEDIATRIC BOTTLE 3 CC  Final   Culture   Final    NO GROWTH 2 DAYS Performed at [facility]    Report Status PENDING  Incomplete  Culture, blood  (routine x 2)     Status: None (Preliminary result)   Collection Time: 06/26/15  3:25 PM  Result Value Ref Range Status   Specimen Description BLOOD LEFT WRIST  Final   Special Requests IN PEDIATRIC BOTTLE 2 CC  Final   Culture   Final    NO GROWTH 2 DAYS Performed at [facility]    Report Status PENDING  Incomplete    Radiology Reports Dg Chest 2 View  06/26/2015   CLINICAL DATA:  Hypoxia.  Altered mental status.  EXAM: CHEST  2 VIEW  COMPARISON:  None.  FINDINGS: Moderate cardiomegaly and pulmonary vascular congestion are noted. No evidence of frank pulmonary edema or pulmonary consolidation. No evidence of pleural effusion. Bilateral parenchymal scarring noted with surgical staples in the right midlung field.  IMPRESSION: Cardiomegaly and pulmonary vascular congestion.  No acute  findings.   Electronically Signed   By: Myles Rosenthal M.D.   On: 06/26/2015 11:53   Ct Head Wo Contrast  06/26/2015   CLINICAL DATA:  Acute mental status changes today.  EXAM: CT HEAD WITHOUT CONTRAST  TECHNIQUE: Contiguous axial images were obtained from the base of the skull through the vertex without intravenous contrast.  COMPARISON:  None available.  FINDINGS: Stable age related cerebral atrophy, ventriculomegaly and periventricular white matter disease. Benign-appearing bilateral basal ganglia calcifications. No extra-axial fluid collections are identified. No CT findings for acute hemispheric infarction or intracranial hemorrhage. No mass lesions. The brainstem and cerebellum are normal.  The bony structures are intact. The paranasal sinuses and mastoid air cells are clear. The globes are intact. Moderate atherosclerotic calcifications are noted.  IMPRESSION: Age related cerebral atrophy, ventriculomegaly and periventricular white matter disease. No acute intracranial findings or mass lesions.   Electronically Signed   By: Rudie Meyer M.D.   On: 06/26/2015 11:44     CBC  Recent Labs Lab 06/26/15 1035  06/26/15 1520 06/27/15 0135 06/28/15 0656  WBC 9.2 6.8 5.1 12.1*  HGB 11.2* 11.4* 10.4* 11.4*  HCT 35.9* 36.0 33.7* 35.5*  PLT 151 146* 133* 166  MCV 98.4 98.4 97.1 92.9  MCH 30.7 31.1 30.0 29.8  MCHC 31.2 31.7 30.9 32.1  RDW 13.9 14.1 13.8 13.8  LYMPHSABS 0.8  --   --   --   MONOABS 0.4  --   --   --   EOSABS 0.0  --   --   --   BASOSABS 0.0  --   --   --     Chemistries   Recent Labs Lab 06/26/15 1035 06/26/15 1520 06/27/15 0135 06/28/15 0656 06/29/15 0822  NA 142  --  143 142 145  K 4.6  --  5.1 4.6 4.2  CL 96*  --  99* 101 106  CO2 37*  --  41* 35* 33*  GLUCOSE 133*  --  113* 129* 109*  BUN 34*  --  35* 44* 34*  CREATININE 2.22* 2.11* 2.07* 1.92* 1.64*  CALCIUM 9.3  --  9.1 9.5 9.8  MG  --  2.2  --   --  2.3  AST 54* 44* 28 22 20   ALT 28 28 23 18 15   ALKPHOS 70 76 62 63 62  BILITOT 0.4 0.4 0.8 0.5 0.7   ------------------------------------------------------------------------------------------------------------------ estimated creatinine clearance is 21.7 mL/min (by C-G formula based on Cr of 1.64). ------------------------------------------------------------------------------------------------------------------  Recent Labs  06/26/15 1520  HGBA1C 4.9   ------------------------------------------------------------------------------------------------------------------ No results for input(s): CHOL, HDL, LDLCALC, TRIG, CHOLHDL, LDLDIRECT in the last 72 hours. ------------------------------------------------------------------------------------------------------------------  Recent Labs  06/26/15 1520  TSH 0.626   ------------------------------------------------------------------------------------------------------------------ No results for input(s): VITAMINB12, FOLATE, FERRITIN, TIBC, IRON, RETICCTPCT in the last 72 hours.  Coagulation profile No results for input(s): INR, PROTIME in the last 168 hours.  No results for input(s): DDIMER in the last 72  hours.  Cardiac Enzymes  Recent Labs Lab 06/26/15 1520 06/26/15 1950 06/27/15 0135  TROPONINI 0.10* 0.10* 0.09*   ------------------------------------------------------------------------------------------------------------------ Invalid input(s): POCBNP   CBG:  Recent Labs Lab 06/28/15 1605 06/28/15 2149 06/29/15 0101 06/29/15 0434 06/29/15 0855  GLUCAP 156* 169* 128* 87 97       Studies: No results found.    Lab Results  Component Value Date   HGBA1C 4.9 06/26/2015   Lab Results  Component Value Date   CREATININE 1.64* 06/29/2015  Scheduled Meds: . antiseptic oral rinse  7 mL Mouth Rinse BID  . aspirin  300 mg Rectal Daily  . diltiazem  60 mg Oral 3 times per day  . haloperidol lactate  2.5 mg Intravenous Once  . heparin  5,000 Units Subcutaneous 3 times per day  . insulin aspart  0-9 Units Subcutaneous 6 times per day  . ipratropium-albuterol  3 mL Nebulization TID  . methylPREDNISolone (SOLU-MEDROL) injection  40 mg Intravenous Q8H  . mometasone-formoterol  2 puff Inhalation BID  . piperacillin-tazobactam (ZOSYN)  IV  2.25 g Intravenous Q6H  . QUEtiapine  25 mg Oral QHS  . sodium chloride  3 mL Intravenous Q12H   Continuous Infusions: . sodium chloride 10 mL/hr at 06/29/15 4315    Active Problems:   Altered mental status   Acute respiratory failure with hypercapnia    Time spent: 45 minutes   [facility]  [facility] Pager [phone]. If 7PM-7AM, please contact night-coverage at [email or web address], password [facility] 06/29/2015, 12:02 PM  LOS: 3 days

## 2015-06-30 ENCOUNTER — Inpatient Hospital Stay (HOSPITAL_COMMUNITY): Payer: Medicare HMO

## 2015-06-30 LAB — GLUCOSE, CAPILLARY
GLUCOSE-CAPILLARY: 144 mg/dL — AB (ref 65–99)
GLUCOSE-CAPILLARY: 123 mg/dL — AB (ref 65–99)
GLUCOSE-CAPILLARY: 193 mg/dL — AB (ref 65–99)
Glucose-Capillary: 174 mg/dL — ABNORMAL HIGH (ref 65–99)
Glucose-Capillary: 202 mg/dL — ABNORMAL HIGH (ref 65–99)
Glucose-Capillary: 171 mg/dL — ABNORMAL HIGH (ref 65–99)

## 2015-06-30 LAB — COMPREHENSIVE METABOLIC PANEL
ALBUMIN: 2.8 g/dL — AB (ref 3.5–5.0)
ALK PHOS: 47 U/L (ref 38–126)
ALT: 12 U/L — ABNORMAL LOW (ref 14–54)
AST: 14 U/L — AB (ref 15–41)
Anion gap: 6 (ref 5–15)
BILIRUBIN TOTAL: 0.6 mg/dL (ref 0.3–1.2)
BUN: 42 mg/dL — AB (ref 6–20)
CALCIUM: 9.4 mg/dL (ref 8.9–10.3)
CO2: 34 mmol/L — AB (ref 22–32)
Chloride: 106 mmol/L (ref 101–111)
Creatinine, Ser: 1.77 mg/dL — ABNORMAL HIGH (ref 0.44–1.00)
GFR calc Af Amer: 29 mL/min — ABNORMAL LOW (ref >60)
GFR calc non Af Amer: 25 mL/min — ABNORMAL LOW (ref >60)
GLUCOSE: 137 mg/dL — AB (ref 65–99)
POTASSIUM: 4.2 mmol/L (ref 3.5–5.1)
SODIUM: 146 mmol/L — AB (ref 135–145)
TOTAL PROTEIN: 5.5 g/dL — AB (ref 6.5–8.1)

## 2015-06-30 LAB — CBC
HEMATOCRIT: 33 % — AB (ref 36.0–46.0)
HEMOGLOBIN: 10.9 g/dL — AB (ref 12.0–15.0)
MCH: 30.8 pg (ref 26.0–34.0)
MCHC: 33 g/dL (ref 30.0–36.0)
MCV: 93.2 fL (ref 78.0–100.0)
Platelets: 166 10*3/uL (ref 150–400)
RBC: 3.54 MIL/uL — ABNORMAL LOW (ref 3.87–5.11)
RDW: 14.2 % (ref 11.5–15.5)
WBC: 9 10*3/uL (ref 4.0–10.5)

## 2015-06-30 MED ORDER — METHYLPREDNISOLONE SODIUM SUCC 40 MG IJ SOLR
40.0000 mg | Freq: Two times a day (BID) | INTRAMUSCULAR | Status: DC
  Administered 2015-06-30 – 2015-07-01 (×2): 40 mg via INTRAVENOUS
  Filled 2015-06-30 (×2): qty 1

## 2015-06-30 MED ORDER — DILTIAZEM HCL 30 MG PO TABS
30.0000 mg | ORAL_TABLET | Freq: Three times a day (TID) | ORAL | Status: DC
  Administered 2015-06-30 – 2015-07-03 (×9): 30 mg via ORAL
  Filled 2015-06-30 (×10): qty 1

## 2015-06-30 MED ORDER — SODIUM CHLORIDE 0.45 % IV SOLN
INTRAVENOUS | Status: DC
  Administered 2015-06-30: 09:00:00 via INTRAVENOUS

## 2015-06-30 NOTE — Progress Notes (Signed)
Placed pt on nocturnal bipap per md rx.  Settings per previous night: bipap 12/5 with full face mask.  3l O2 bled in through bipap.  Nasal mask attempted, but not effective due to pt keeping her mouth open.  Pt placed on a full face mask and is tolerating well at this time.  HR78, spo2 94  %.

## 2015-06-30 NOTE — Progress Notes (Signed)
[facility] PROGRESS NOTE  Guneet Delpino Bohall MVH:[id number] DOB: Mar 23, 1926 DOA: 06/26/2015 PCP: [name], MD  Assessment/Plan: Active Problems:   Altered mental status   Acute respiratory failure with hypercapnia    Altered mental status/toxic metabolic encephalopathy-likely secondary to hypercarbic hypoxemic respiratory failure, patient probably encephalopathic also secondary to acute renal failure, this is slowly improving. CT head negative,   MRI to rule out CVA is still pending,  patient has had a waxing and waning mental status since admission. Patient is definitely more alert and awake today. ABG shows significant improvement in her PCO2 which is down to 45.3. Currently off BiPAP    Acute respiratory failure with hypercapnia-continue BiPAP daily at bedtime and while sleeping. Suspect that this is likely secondary to COPD exacerbation, continue IV steroids [continue steroids taper].  Continue empiric antibiotics  . Speech therapy evaluation to rule out aspiration.they recommend Dysphagia 3 (Mech soft);Thin  Patient also has some evidence of troponin leak on her cardiac enzyme profile, however his cardiac enzymes have been flat in the setting of mild renal insufficiency. Transfer to telemetry  Multifocal atrial tachycardia- patient started on by mouth Cardizem, reduced dose of Cardizem today as the patient's blood pressure soft, results of 2-D echo pending, potassium and magnesium is okay  Acute renal failure, baseline creatinine unknown, slowly improving, continue normal saline, patient has elevated bicarbonate probably compensation from a respiratory acidosis and appears to be chronic. Creatinine has improved from 2.22> 1.92 > 1.64>1.77. Creatinine likely now at baseline, started on half normal saline because of hypernatremia   Diarrhea-patient has had no diarrhea to collect GI pathogen panel/C. difficile, enteric precautions will be discontinued, ,  no significant  diarrhea since admission,    PT consult and try to mobilize the patient   Code Status:      Code Status Orders        Start     Ordered   06/26/15 1413  Do not attempt resuscitation (DNR)   Continuous    Question Answer Comment  In the event of cardiac or respiratory ARREST Do not call a "code blue"   In the event of cardiac or respiratory ARREST Do not perform Intubation, CPR, defibrillation or ACLS   In the event of cardiac or respiratory ARREST Use medication by any route, position, wound care, and other measures to relive pain and suffering. May use oxygen, suction and manual treatment of airway obstruction as needed for comfort.      06/26/15 1412    Advance Directive Documentation        Most Recent Value   Type of Advance Directive  Out of facility DNR (pink MOST or yellow form)   Pre-existing out of facility DNR order (yellow form or pink MOST form)  Yellow form placed in chart (order not valid for inpatient use)   "MOST" Form in Place?       Family Communication: family updated about patient's clinical progress Disposition Plan:  Continue on step down   Brief narrative: HPI:  81 year old female resident of assisted living, comes in with confusion, generalized weakness, worsening shortness of breath. Patient has had diarrhea since Monday. Last episode of diarrhea was prior to coming in. Patient was found to be hypoxic, 80% on room air, he quickly declined requiring BiPAP support. ABG showed pH of 7.2, PCO2 of 111, bicarbonate 37. Patient also found to be in acute on chronic renal failure. Patient is a poor historian and unable to provide  any meaningful history. Most of the history is compiled from the patient's chart. Patient apparently also had an episode of becoming aphasic for several minutes in the ER with her eyes open, no seizure activity was noted, no focal motor weakness was noted, patient started talking after a few minutes and was apparently back to baseline. She  did not have any episodes of diarrhea in the ER.   Consultants:  None  Procedures:  BiPAP  Antibiotics: Anti-infectives    Start     Dose/Rate Route Frequency Ordered Stop   06/29/15 1600  piperacillin-tazobactam (ZOSYN) IVPB 3.375 g     3.375 g 12.5 mL/hr over 240 Minutes Intravenous 3 times per day 06/29/15 1406     06/26/15 1500  piperacillin-tazobactam (ZOSYN) IVPB 2.25 g  Status:  Discontinued     2.25 g 100 mL/hr over 30 Minutes Intravenous Every 6 hours 06/26/15 1429 06/29/15 1406         HPI/Subjective: Patient is more awake and alert, denies any chest pain or shortness of breath  Objective: Filed Vitals:   06/30/15 0927 06/30/15 1000 06/30/15 1200 06/30/15 1218  BP:   114/59   Pulse:  78 38   Temp:    97.7 F (36.5 C)  TempSrc:    Oral  Resp:  23 29   Height:      Weight:      SpO2: 99% 98% 100%     Intake/Output Summary (Last 24 hours) at 06/30/15 1319 Last data filed at 06/30/15 1200  Gross per 24 hour  Intake    705 ml  Output    525 ml  Net    180 ml    Exam:  General: No acute respiratory distress Lungs: Clear to auscultation bilaterally without wheezes or crackles Cardiovascular: Regular rate and rhythm without murmur gallop or rub normal S1 and S2 Abdomen: Nontender, nondistended, soft, bowel sounds positive, no rebound, no ascites, no appreciable mass Extremities: No significant cyanosis, clubbing, or edema bilateral lower extremities     Data Review   Micro Results Recent Results (from the past 240 hour(s))  MRSA PCR Screening     Status: None   Collection Time: 06/26/15  2:37 PM  Result Value Ref Range Status   MRSA by PCR NEGATIVE NEGATIVE Final    Comment:        The GeneXpert MRSA Assay (FDA approved for NASAL specimens only), is one component of a comprehensive MRSA colonization surveillance program. It is not intended to diagnose MRSA infection nor to guide or monitor treatment for MRSA infections.   Culture,  blood (routine x 2)     Status: None (Preliminary result)   Collection Time: 06/26/15  3:20 PM  Result Value Ref Range Status   Specimen Description BLOOD [facility]  Final   Special Requests IN PEDIATRIC BOTTLE 3 CC  Final   Culture   Final    NO GROWTH 3 DAYS Performed at [facility]    Report Status PENDING  Incomplete  Culture, blood (routine x 2)     Status: None (Preliminary result)   Collection Time: 06/26/15  3:25 PM  Result Value Ref Range Status   Specimen Description BLOOD LEFT WRIST  Final   Special Requests IN PEDIATRIC BOTTLE 2 CC  Final   Culture   Final    NO GROWTH 3 DAYS Performed at [facility]    Report Status PENDING  Incomplete    Radiology Reports Dg Chest 2 View  06/26/2015   CLINICAL DATA:  Hypoxia.  Altered mental status.  EXAM: CHEST  2 VIEW  COMPARISON:  None.  FINDINGS: Moderate cardiomegaly and pulmonary vascular congestion are noted. No evidence of frank pulmonary edema or pulmonary consolidation. No evidence of pleural effusion. Bilateral parenchymal scarring noted with surgical staples in the right midlung field.  IMPRESSION: Cardiomegaly and pulmonary vascular congestion.  No acute findings.   Electronically Signed   By: Myles Rosenthal M.D.   On: 06/26/2015 11:53   Ct Head Wo Contrast  06/26/2015   CLINICAL DATA:  Acute mental status changes today.  EXAM: CT HEAD WITHOUT CONTRAST  TECHNIQUE: Contiguous axial images were obtained from the base of the skull through the vertex without intravenous contrast.  COMPARISON:  None available.  FINDINGS: Stable age related cerebral atrophy, ventriculomegaly and periventricular white matter disease. Benign-appearing bilateral basal ganglia calcifications. No extra-axial fluid collections are identified. No CT findings for acute hemispheric infarction or intracranial hemorrhage. No mass lesions. The brainstem and cerebellum are normal.  The bony structures are intact. The paranasal sinuses and mastoid air cells  are clear. The globes are intact. Moderate atherosclerotic calcifications are noted.  IMPRESSION: Age related cerebral atrophy, ventriculomegaly and periventricular white matter disease. No acute intracranial findings or mass lesions.   Electronically Signed   By: Rudie Meyer M.D.   On: 06/26/2015 11:44     CBC  Recent Labs Lab 06/26/15 1035 06/26/15 1520 06/27/15 0135 06/28/15 0656 06/30/15 0625  WBC 9.2 6.8 5.1 12.1* 9.0  HGB 11.2* 11.4* 10.4* 11.4* 10.9*  HCT 35.9* 36.0 33.7* 35.5* 33.0*  PLT 151 146* 133* 166 166  MCV 98.4 98.4 97.1 92.9 93.2  MCH 30.7 31.1 30.0 29.8 30.8  MCHC 31.2 31.7 30.9 32.1 33.0  RDW 13.9 14.1 13.8 13.8 14.2  LYMPHSABS 0.8  --   --   --   --   MONOABS 0.4  --   --   --   --   EOSABS 0.0  --   --   --   --   BASOSABS 0.0  --   --   --   --     Chemistries   Recent Labs Lab 06/26/15 1035 06/26/15 1520 06/27/15 0135 06/28/15 0656 06/29/15 0822 06/30/15 0625  NA 142  --  143 142 145 146*  K 4.6  --  5.1 4.6 4.2 4.2  CL 96*  --  99* 101 106 106  CO2 37*  --  41* 35* 33* 34*  GLUCOSE 133*  --  113* 129* 109* 137*  BUN 34*  --  35* 44* 34* 42*  CREATININE 2.22* 2.11* 2.07* 1.92* 1.64* 1.77*  CALCIUM 9.3  --  9.1 9.5 9.8 9.4  MG  --  2.2  --   --  2.3  --   AST 54* 44* 14*  ALT 12*  ALKPHOS 70 76 62 63 62 47  BILITOT 0.4 0.4 0.8 0.5 0.7 0.6   ------------------------------------------------------------------------------------------------------------------ estimated creatinine clearance is 20.1 mL/min (by C-G formula based on Cr of 1.77). ------------------------------------------------------------------------------------------------------------------ No results for input(s): HGBA1C in the last 72 hours. ------------------------------------------------------------------------------------------------------------------ No results for input(s): CHOL, HDL, LDLCALC, TRIG, CHOLHDL, LDLDIRECT in the last 72  hours. ------------------------------------------------------------------------------------------------------------------ No results for input(s): TSH, T4TOTAL, T3FREE, THYROIDAB in the last 72 hours.  Invalid input(s): FREET3 ------------------------------------------------------------------------------------------------------------------ No results for input(s): VITAMINB12, FOLATE, FERRITIN, TIBC, IRON, RETICCTPCT in the last 72 hours.  Coagulation profile No  results for input(s): INR, PROTIME in the last 168 hours.  No results for input(s): DDIMER in the last 72 hours.  Cardiac Enzymes  Recent Labs Lab 06/26/15 1520 06/26/15 1950 06/27/15 0135  TROPONINI 0.10* 0.10* 0.09*   ------------------------------------------------------------------------------------------------------------------ Invalid input(s): POCBNP   CBG:  Recent Labs Lab 06/29/15 2030 06/30/15 0039 06/30/15 0454 06/30/15 0744 06/30/15 1205  GLUCAP 171* 174* 144* 123* 202*       Studies: No results found.    Lab Results  Component Value Date   HGBA1C 4.9 06/26/2015   Lab Results  Component Value Date   CREATININE 1.77* 06/30/2015       Scheduled Meds: . antiseptic oral rinse  7 mL Mouth Rinse BID  . aspirin  300 mg Rectal Daily  . diltiazem  30 mg Oral 3 times per day  . heparin  5,000 Units Subcutaneous 3 times per day  . insulin aspart  0-9 Units Subcutaneous 6 times per day  . ipratropium-albuterol  3 mL Nebulization TID  . methylPREDNISolone (SOLU-MEDROL) injection  40 mg Intravenous Q12H  . mometasone-formoterol  2 puff Inhalation BID  . piperacillin-tazobactam (ZOSYN)  IV  3.375 g Intravenous 3 times per day  . QUEtiapine  25 mg Oral QHS  . sodium chloride  3 mL Intravenous Q12H   Continuous Infusions: . sodium chloride 50 mL/hr at 06/30/15 0906  . sodium chloride 10 mL/hr at 06/29/15 1610    Active Problems:   Altered mental status   Acute respiratory failure with  hypercapnia    Time spent: 45 minutes   [facility]  [facility] Pager [phone]. If 7PM-7AM, please contact night-coverage at [email or web address], password [facility] 06/30/2015, 1:19 PM  LOS: 4 days

## 2015-06-30 NOTE — Clinical Social Work Note (Signed)
Clinical Social Work Assessment  Patient Details  Name: Andrea Weber MRN: [id number] Date of Birth: 12-19-1925  Date of referral:  06/30/15               Reason for consult:  Facility Placement, Discharge Planning                Permission sought to share information with:  Guardian Permission granted to share information::  Yes, Verbal Permission Granted  Name::        Agency::     Relationship::     Contact Information:     Housing/Transportation Living arrangements for the past 2 months:  Assisted Living Facility Source of Information:  Facility, Guardian Patient Interpreter Needed:  None Criminal Activity/Legal Involvement Pertinent to Current Situation/Hospitalization:  No - Comment as needed Significant Relationships:  Other(Comment) Chemical engineer, Guardian) Lives with:  Facility Resident Do you feel safe going back to the place where you live?   (Higher level of care may be needed.) Need for family participation in patient care:  Yes (Comment)  Care giving concerns:  Higher level of care may be needed at d/c.   Social Worker assessment / plan:  Pt hospitalized on 06/26/15 with AMS/Toxic metabolic encephalopathy. Pt is a LTC resident from [facility] ( ALF ). CSW spoke with pt's Meriel Pica [phone] / [phone] ) to assist with d/c planning. PT has recommended SNF placement depending on pt's progression and the amount of assistance available at ALF. [facility]F contacted [facility] [phone] ) and clinical info sent for review. [facility] reports pt required only minimal assistance with ADLs at facility.  Pt will need to return to baseline in order for her to return to ALF. [facility] will assess pt onsite once pt transfers out of Stepdown. Guardian gave CSW permission to initiate SNF search in case SNF placement is required. Pt has [employer] which requires prior authorization. CSW will assist with authorization process, if SNF is  required.  Employment status:  Retired Database administrator PT Recommendations:  Skilled Nursing Facility Information / Referral to community resources:  Skilled Nursing Facility  Patient/Family's Response to care:  Guardian will follow MD / PT recommendations.  Patient/Family's Understanding of and Emotional Response to Diagnosis, Current Treatment, and Prognosis: Guardian has a good understanding of pt's medical status. He would like pt to return to ALF, if possible. Emotional Assessment Appearance:  Appears stated age Attitude/Demeanor/Rapport:  Unable to Assess (Pt sleeping at time of CSW visit.) Affect (typically observed):  Unable to Assess (Pt sleeping at time of CSW visit.) Orientation:  Oriented to Self Alcohol / Substance use:  Not Applicable Psych involvement (Current and /or in the community):  No (Comment)  Discharge Needs  Concerns to be addressed:  Discharge Planning Concerns Readmission within the last 30 days:  No Current discharge risk:  None Barriers to Discharge:  No Barriers Identified   Royetta Asal, LCSW [phone] 06/30/2015, 9:43 AM [phone] /

## 2015-06-30 NOTE — Progress Notes (Signed)
Received report from Hillary, RN. Agree with previous assessment. Will continue to monitor. 

## 2015-06-30 NOTE — Care Management Important Message (Signed)
Important Message  Patient Details  Name: Andrea Weber MRN: [id number] Date of Birth: May 15, 1926   Medicare Important Message Given:  Yes-second notification given    Haskell Flirt 06/30/2015, 11:34 AMImportant Message  Patient Details  Name: Andrea Weber MRN: [id number] Date of Birth: 10-Oct-1926   Medicare Important Message Given:  Yes-second notification given    Haskell Flirt 06/30/2015, 11:34 AM

## 2015-06-30 NOTE — Clinical Social Work Placement (Signed)
   CLINICAL SOCIAL WORK PLACEMENT  NOTE  Date:  06/30/2015  Patient Details  Name: Andrea Weber MRN: [id number] Date of Birth: 11-19-1925  Clinical Social Work is seeking post-discharge placement for this patient at the Skilled  Nursing Facility level of care (*CSW will initial, date and re-position this form in  chart as items are completed):  No   Patient/family provided with [facility] Clinical Social Work Department's list of facilities offering this level of care within the geographic area requested by the patient (or if unable, by the patient's family).  Yes   Patient/family informed of their freedom to choose among providers that offer the needed level of care, that participate in Medicare, Medicaid or managed care program needed by the patient, have an available bed and are willing to accept the patient.  No   Patient/family informed of [place]'s ownership interest in [facility] and [facility], as well as of the fact that they are under no obligation to receive care at these facilities.  PASRR submitted to EDS on       PASRR number received on       Existing PASRR number confirmed on       FL2 transmitted to all facilities in geographic area requested by pt/family on       FL2 transmitted to all facilities within larger geographic area on       Patient informed that his/her managed care company has contracts with or will negotiate with certain facilities, including the following:            Patient/family informed of bed offers received.  Patient chooses bed at       Physician recommends and patient chooses bed at      Patient to be transferred to   on  .  Patient to be transferred to facility by       Patient family notified on   of transfer.  Name of family member notified:        PHYSICIAN       Additional Comment:    _______________________________________________ Royetta Asal, LCSW [phone] 06/30/2015, 9:56 AM

## 2015-07-01 DIAGNOSIS — J9602 Acute respiratory failure with hypercapnia: Secondary | ICD-10-CM

## 2015-07-01 DIAGNOSIS — G9340 Encephalopathy, unspecified: Secondary | ICD-10-CM

## 2015-07-01 DIAGNOSIS — N179 Acute kidney failure, unspecified: Secondary | ICD-10-CM

## 2015-07-01 LAB — GLUCOSE, CAPILLARY
GLUCOSE-CAPILLARY: 141 mg/dL — AB (ref 65–99)
GLUCOSE-CAPILLARY: 126 mg/dL — AB (ref 65–99)
GLUCOSE-CAPILLARY: 139 mg/dL — AB (ref 65–99)
Glucose-Capillary: 125 mg/dL — ABNORMAL HIGH (ref 65–99)
Glucose-Capillary: 178 mg/dL — ABNORMAL HIGH (ref 65–99)
Glucose-Capillary: 99 mg/dL (ref 65–99)

## 2015-07-01 LAB — COMPREHENSIVE METABOLIC PANEL
ALBUMIN: 2.9 g/dL — AB (ref 3.5–5.0)
ALK PHOS: 50 U/L (ref 38–126)
ALT: 13 U/L — ABNORMAL LOW (ref 14–54)
ANION GAP: 6 (ref 5–15)
AST: 11 U/L — ABNORMAL LOW (ref 15–41)
BILIRUBIN TOTAL: 0.6 mg/dL (ref 0.3–1.2)
BUN: 50 mg/dL — ABNORMAL HIGH (ref 6–20)
CALCIUM: 9.6 mg/dL (ref 8.9–10.3)
CO2: 32 mmol/L (ref 22–32)
Chloride: 104 mmol/L (ref 101–111)
Creatinine, Ser: 1.93 mg/dL — ABNORMAL HIGH (ref 0.44–1.00)
GFR, EST AFRICAN AMERICAN: 26 mL/min — AB (ref >60)
GFR, EST NON AFRICAN AMERICAN: 22 mL/min — AB (ref >60)
Glucose, Bld: 144 mg/dL — ABNORMAL HIGH (ref 65–99)
POTASSIUM: 4.1 mmol/L (ref 3.5–5.1)
Sodium: 142 mmol/L (ref 135–145)
TOTAL PROTEIN: 5.6 g/dL — AB (ref 6.5–8.1)

## 2015-07-01 LAB — CULTURE, BLOOD (ROUTINE X 2)
Culture: NO GROWTH
Culture: NO GROWTH

## 2015-07-01 LAB — CBC
HEMATOCRIT: 35.8 % — AB (ref 36.0–46.0)
HEMOGLOBIN: 10.9 g/dL — AB (ref 12.0–15.0)
MCH: 28.8 pg (ref 26.0–34.0)
MCHC: 30.4 g/dL (ref 30.0–36.0)
MCV: 94.7 fL (ref 78.0–100.0)
Platelets: 156 10*3/uL (ref 150–400)
RBC: 3.78 MIL/uL — AB (ref 3.87–5.11)
RDW: 14.5 % (ref 11.5–15.5)
WBC: 12 10*3/uL — AB (ref 4.0–10.5)

## 2015-07-01 MED ORDER — LEVOFLOXACIN 500 MG PO TABS
500.0000 mg | ORAL_TABLET | ORAL | Status: DC
  Administered 2015-07-01 – 2015-07-03 (×2): 500 mg via ORAL
  Filled 2015-07-01 (×2): qty 1

## 2015-07-01 MED ORDER — PREDNISONE 20 MG PO TABS
40.0000 mg | ORAL_TABLET | Freq: Every day | ORAL | Status: DC
  Administered 2015-07-02 – 2015-07-03 (×2): 40 mg via ORAL
  Filled 2015-07-01 (×2): qty 2

## 2015-07-01 MED ORDER — PIPERACILLIN-TAZOBACTAM IN DEX 2-0.25 GM/50ML IV SOLN
2.2500 g | Freq: Four times a day (QID) | INTRAVENOUS | Status: DC
  Filled 2015-07-01: qty 50

## 2015-07-01 NOTE — Clinical Social Work Placement (Signed)
CSW spoke with patient's guardian, Marilu Favre re: SNF bed offers. Guardian chose [facility] SNF. Clinicals submitted to [facility] for authorization - anticipating possible discharge tomorrow.     Lincoln Maxin, LCSW [facility] Clinical Social Worker cell #: [phone]    CLINICAL SOCIAL WORK PLACEMENT  NOTE  Date:  07/01/2015  Patient Details  Name: Andrea Weber MRN: [id number] Date of Birth: [birth year]  Clinical Social Work is seeking post-discharge placement for this patient at the Skilled  Nursing Facility level of care (*CSW will initial, date and re-position this form in  chart as items are completed):  No   Patient/family provided with [facility] Clinical Social Work Department's list of facilities offering this level of care within the geographic area requested by the patient (or if unable, by the patient's family).  Yes   Patient/family informed of their freedom to choose among providers that offer the needed level of care, that participate in Medicare, Medicaid or managed care program needed by the patient, have an available bed and are willing to accept the patient.  No   Patient/family informed of [place]'s ownership interest in [facility] and [facility], as well as of the fact that they are under no obligation to receive care at these facilities.  PASRR submitted to EDS on 07/01/15     PASRR number received on 07/01/15     Existing PASRR number confirmed on       FL2 transmitted to all facilities in geographic area requested by pt/family on 07/01/15     FL2 transmitted to all facilities within larger geographic area on       Patient informed that his/her managed care company has contracts with or will negotiate with certain facilities, including the following:        Yes   Patient/family informed of bed offers received.  Patient chooses bed at [facility]     Physician  recommends and patient chooses bed at      Patient to be transferred to [facility] on  .  Patient to be transferred to facility by       Patient family notified on   of transfer.  Name of family member notified:        PHYSICIAN       Additional Comment:    _______________________________________________ Arlyss Repress, LCSW 07/01/2015, 11:30 AM

## 2015-07-01 NOTE — Progress Notes (Signed)
PROGRESS NOTE  Andrea Weber RUE:[id number] DOB: [birth year] DOA: 06/26/2015 PCP: [name], MD  HPI: 82 year old female resident of assisted living, comes in with confusion, generalized weakness, worsening shortness of breath. Patient has had diarrhea since Monday. Last episode of diarrhea was prior to coming in. Patient was found to be hypoxic, 80% on room air, she quickly declined requiring BiPAP support. ABG showed pH of 7.2, PCO2 of 111, bicarbonate 37. Patient also found to be in acute on chronic renal failure.   Subjective / 24 H Interval events - feeling better this morning, eating breakfast, denies shortness of breath,  Assessment/Plan: Active Problems:   Altered mental status   Acute respiratory failure with hypercapnia   Acute encephalopathy - likely secondary to hypercarbic and hypoxemic respiratory failure - improving, she is AxOx3 this morning - MRI brain without acute findings  Acute respiratory failure with hypercapnia - continue BiPAP daily at bedtime and while sleeping.  - Suspect that this is likely secondary to COPD exacerbation, continue steroids, transition to po Prednisone today  - continue with antibiotics, narrow to Levaquin today - Speech therapy evaluation to rule out aspiration.they recommend Dysphagia 3 (Mech soft);Thin  Multifocal atrial tachycardia - patient started on by mouth Cardizem, continue - 2D echo with EF 65-70%, grade 1 diastolic dysfunction  Acute renal failure - patient reports seeing a nephrologist and stated that her renal failure is not new - baseline creatinine unknown, overall stable  Diarrhea - patient has had no diarrhea to collect GI pathogen panel/C. difficile, enteric precautions will be discontinued, no significant diarrhea since admission  Hypernatremia - in the setting of poor po intake, improved with fluids - discontinue fluids today as she is having good po intake, and she is up 6L from admission   Diet:  DIET DYS 3 Room service appropriate?: Yes with Assist; Fluid consistency:: Thin Fluids: none  DVT Prophylaxis: heparin s.q  Code Status: DNR Family Communication: no family bedside  Disposition Plan: SNF 1-2 days  Consultants:  None   Procedures:  2D echo   Antibiotics Zosyn 8/19 >> 8/24  Levofloxacin 8/24 >>   Studies  Mr Brain Wo Contrast  06/30/2015   CLINICAL DATA:  81 year old female with altered mental status. Hypoxic respiratory failure. Initial encounter.  EXAM: MRI HEAD WITHOUT CONTRAST  TECHNIQUE: Multiplanar, multiecho pulse sequences of the brain and surrounding structures were obtained without intravenous contrast.  COMPARISON:  Head CT without contrast 06/26/2015 and earlier  FINDINGS: Cerebral volume is within normal limits for age. No restricted diffusion to suggest acute infarction. No midline shift, mass effect, evidence of mass lesion, ventriculomegaly, extra-axial collection or acute intracranial hemorrhage. Cervicomedullary junction and pituitary are within normal limits. Major intracranial vascular flow voids are within normal limits. Tortuous distal cervical ICAs.  Patchy and confluent bilateral cerebral white matter T2 and FLAIR hyperintensity. Similar mild to moderate for age T2 heterogeneity in the deep gray matter nuclei. Mild patchy T2 heterogeneity in the pons. The cerebellum appears within normal limits. No cortical encephalomalacia or chronic cerebral blood products identified.  Visible internal auditory structures appear normal. Paranasal sinuses and mastoids are clear. Postoperative changes to the left globe. Other orbit and scalp soft tissues are within normal limits. Normal bone marrow signal. Suggestion of mild degenerative spinal stenosis at C3-C4.  IMPRESSION: 1.  No acute intracranial abnormality. 2. Mild to moderate for age nonspecific signal changes in the brain, most commonly due to chronic small vessel disease.   Electronically Signed   By:  Odessa Fleming  M.D.   On: 06/30/2015 14:07   Objective  Filed Vitals:   06/30/15 2004 06/30/15 2326 07/01/15 0501 07/01/15 0829  BP:   139/67   Pulse:  78 74 85  Temp:   98 F (36.7 C)   TempSrc:   Oral   Resp:  20 21 17   Height:      Weight:      SpO2: 95% 94% 100% 99%    Intake/Output Summary (Last 24 hours) at 07/01/15 1109 Last data filed at 07/01/15 0700  Gross per 24 hour  Intake   1650 ml  Output    475 ml  Net   1175 ml   Filed Weights   06/27/15 0453 06/28/15 0448 06/30/15 0500  Weight: 60.7 kg (133 lb 13.1 oz) 65.5 kg (144 lb 6.4 oz) 64.1 kg (141 lb 5 oz)   Exam:  GENERAL: NAD  HEENT: head NCAT, no scleral icterus.   NECK: Supple.   LUNGS: Clear to auscultation. No wheezing or crackles  HEART: Regular rate and rhythm without murmur. 2+ pulses, no JVD, no peripheral edema  ABDOMEN: Soft, nontender, and nondistended. Positive bowel sounds.  EXTREMITIES: Without any cyanosis, clubbing, rash, lesions or edema.  NEUROLOGIC: Alert and oriented x3. Strength 5/5 in all 4.  Data Reviewed: Basic Metabolic Panel:  Recent Labs Lab 06/26/15 1520 06/27/15 0135 06/28/15 0656 06/29/15 0822 06/30/15 0625 07/01/15 0600  NA  --  143 142 145 146* 142  K  --  5.1 4.6 4.2 4.2 4.1  CL  --  99* 101 106 106 104  CO2  --  41* 35* 33* 34* 32  GLUCOSE  --  113* 129* 109* 137* 144*  BUN  --  35* 44* 34* 42* 50*  CREATININE 2.11* 2.07* 1.92* 1.64* 1.77* 1.93*  CALCIUM  --  9.1 9.5 9.8 9.4 9.6  MG 2.2  --   --  2.3  --   --    Liver Function Tests:  Recent Labs Lab 06/27/15 0135 06/28/15 0656 06/29/15 0822 06/30/15 0625 07/01/15 0600  AST 28 22 20  14* 11*  ALT 23 18 15  12* 13*  ALKPHOS 62 63 62 47 50  BILITOT 0.8 0.5 0.7 0.6 0.6  PROT 6.7 7.1 6.9 5.5* 5.6*  ALBUMIN 3.5 3.5 3.6 2.8* 2.9*    Recent Labs Lab 06/26/15 1035  LIPASE 24   CBC:  Recent Labs Lab 06/26/15 1035 06/26/15 1520 06/27/15 0135 06/28/15 0656 06/30/15 0625 07/01/15 0600  WBC 9.2 6.8 5.1  12.1* 9.0 12.0*  NEUTROABS 8.0*  --   --   --   --   --   HGB 11.2* 11.4* 10.4* 11.4* 10.9* 10.9*  HCT 35.9* 36.0 33.7* 35.5* 33.0* 35.8*  MCV 98.4 98.4 97.1 92.9 93.2 94.7  PLT 151 146* 133* 166 166 156   Cardiac Enzymes:  Recent Labs Lab 06/26/15 1035 06/26/15 1520 06/26/15 1950 06/27/15 0135  TROPONINI 0.03 0.10* 0.10* 0.09*   BNP (last 3 results)  Recent Labs  06/26/15 1035  BNP 114.7*    ProBNP (last 3 results) No results for input(s): PROBNP in the last 8760 hours.  CBG:  Recent Labs Lab 06/30/15 1619 06/30/15 1957 07/01/15 0014 07/01/15 0434 07/01/15 0759  GLUCAP 171* 193* 178* 141* 126*    Recent Results (from the past 240 hour(s))  MRSA PCR Screening     Status: None   Collection Time: 06/26/15  2:37 PM  Result Value Ref Range Status  MRSA by PCR NEGATIVE NEGATIVE Final    Comment:        The GeneXpert MRSA Assay (FDA approved for NASAL specimens only), is one component of a comprehensive MRSA colonization surveillance program. It is not intended to diagnose MRSA infection nor to guide or monitor treatment for MRSA infections.   Culture, blood (routine x 2)     Status: None (Preliminary result)   Collection Time: 06/26/15  3:20 PM  Result Value Ref Range Status   Specimen Description BLOOD [facility]  Final   Special Requests IN PEDIATRIC BOTTLE 3 CC  Final   Culture   Final    NO GROWTH 4 DAYS Performed at [facility]    Report Status PENDING  Incomplete  Culture, blood (routine x 2)     Status: None (Preliminary result)   Collection Time: 06/26/15  3:25 PM  Result Value Ref Range Status   Specimen Description BLOOD LEFT WRIST  Final   Special Requests IN PEDIATRIC BOTTLE 2 CC  Final   Culture   Final    NO GROWTH 4 DAYS Performed at [facility]    Report Status PENDING  Incomplete     Scheduled Meds: . antiseptic oral rinse  7 mL Mouth Rinse BID  . aspirin  300 mg Rectal Daily  . diltiazem  30 mg Oral 3 times per  day  . heparin  5,000 Units Subcutaneous 3 times per day  . insulin aspart  0-9 Units Subcutaneous 6 times per day  . ipratropium-albuterol  3 mL Nebulization TID  . methylPREDNISolone (SOLU-MEDROL) injection  40 mg Intravenous Q12H  . mometasone-formoterol  2 puff Inhalation BID  . piperacillin-tazobactam (ZOSYN)  IV  2.25 g Intravenous Q6H  . QUEtiapine  25 mg Oral QHS  . sodium chloride  3 mL Intravenous Q12H   Continuous Infusions: . sodium chloride 50 mL/hr at 06/30/15 0906  . sodium chloride 10 mL/hr at 06/29/15 1610    Pamella Pert, MD [facility] Pager [phone]. If 7 PM - 7 AM, please contact night-coverage at [email or web address], password [facility] 07/01/2015, 11:09 AM  LOS: 5 days

## 2015-07-01 NOTE — Care Management Note (Signed)
Case Management Note  Patient Details  Name: Andrea Weber MRN: [id number] Date of Birth: [birth year]  Subjective/Objective: Transfer from SDU. AMS,Resp failure,dysphagia, 02,iv abx.From ALF-[facility]. PT-SNF.CSW following                    Action/Plan:d/c plan SNF   Expected Discharge Date:   (UNKNOWN)               Expected Discharge Plan:  Skilled Nursing Facility  In-House Referral:  Clinical Social Work  Discharge planning Services  CM Consult  Post Acute Care Choice:    Choice offered to:     DME Arranged:    DME Agency:     HH Arranged:    HH Agency:     Status of Service:  In process, will continue to follow  Medicare Important Message Given:  Yes-second notification given Date Medicare IM Given:    Medicare IM give by:    Date Additional Medicare IM Given:    Additional Medicare Important Message give by:     If discussed at Long Length of Stay Meetings, dates discussed:    Additional Comments:  Lanier Clam, RN 07/01/2015, 10:17 AM

## 2015-07-01 NOTE — Progress Notes (Signed)
Occupational Therapy Treatment Patient Details Name: Andrea Weber MRN: [id number] DOB: 1926-09-01 Today's Date: 07/01/2015    History of present illness 81 year old female resident of assisted living, comes in with confusion, generalized weakness, worsening shortness of breath   OT comments  Pt progressing toward OT goals. She currently requires min A for ADLs.  Follow Up Recommendations  Home health OT;Supervision/Assistance - 24 hour    Equipment Recommendations       Recommendations for Other Services      Precautions / Restrictions Precautions Precautions: Fall Precaution Comments: O2 dep Restrictions Weight Bearing Restrictions: No       Mobility Bed Mobility Overal bed mobility: Needs Assistance Bed Mobility: Supine to Sit;Sit to Supine     Supine to sit: Min guard Sit to supine: Min guard   General bed mobility comments: close guard for safety  Transfers Overall transfer level: Needs assistance Equipment used: Rolling walker (2 wheeled) Transfers: Sit to/from [employer] Sit to Stand: Min assist Stand pivot transfers: Min assist       General transfer comment: Assist to rise, stabilize, control descent. VCS safety, technique, hand placement    Balance Overall balance assessment: Needs assistance Sitting-balance support: Feet supported Sitting balance-Leahy Scale: Fair     Standing balance support: During functional activity Standing balance-Leahy Scale: Poor                     ADL Overall ADL's : Needs assistance/impaired     Grooming: Wash/dry hands;Wash/dry face;Oral care;Min guard;Standing Grooming Details (indicate cue type and reason): min verbal cues for organization and sequencing                  Toilet Transfer: Minimal assistance;Ambulation;RW;Comfort height toilet   Toileting- Clothing Manipulation and Hygiene: Minimal assistance;Sit to/from stand       Functional mobility during ADLs:  Min guard;Minimal assistance General ADL Comments: Confusion noted the longer pt worked with therapist, unsure of her baseline and if fatigue may have been contributing      Vision                     Perception     Praxis      Cognition   Behavior During Therapy: WFL for tasks assessed/performed Overall Cognitive Status: No family/caregiver present to determine baseline cognitive functioning                       Extremity/Trunk Assessment               Exercises     Shoulder Instructions       General Comments      Pertinent Vitals/ Pain       Pain Assessment: No/denies pain  Home Living                                          Prior Functioning/Environment              Frequency Min 2X/week     Progress Toward Goals  OT Goals(current goals can now be found in the care plan section)     ADL Goals Pt Will Perform Grooming: with supervision;standing Pt Will Transfer to Toilet: with supervision;regular height toilet;ambulating Pt Will Perform Toileting - Clothing Manipulation and hygiene: with supervision;sit to/from stand  Plan Discharge plan remains appropriate  Co-evaluation                 End of Session Equipment Utilized During Treatment: Rolling walker   Activity Tolerance Patient tolerated treatment well   Patient Left in bed;with call bell/phone within reach;with bed alarm set   Nurse Communication Mobility status        Time: 2355-7322 OT Time Calculation (min): 49 min  Charges: OT General Charges $OT Visit: 1 Procedure OT Treatments $Self Care/Home Management : 38-52 mins  Lawernce Earll M 07/01/2015, 5:19 PM

## 2015-07-01 NOTE — Progress Notes (Signed)
ANTIBIOTIC CONSULT NOTE   Pharmacy Consult for Zosyn Indication: rule out pneumonia  No Known Allergies  Patient Measurements: Height: 5\' 5"  (165.1 cm) Weight: 141 lb 5 oz (64.1 kg) (Simultaneous filing. User may not have seen previous data.) IBW/kg (Calculated) : 57 TBW:   Vital Signs: Temp: 98 F (36.7 C) (08/24 0501) Temp Source: Oral (08/24 0501) BP: 139/67 mmHg (08/24 0501) Pulse Rate: 74 (08/24 0501) Intake/Output from previous day: 08/23 0701 - 08/24 0700 In: 2092.5 [P.O.:840; I.V.:1065; IV Piggyback:187.5] Out: 475 [Urine:475] Intake/Output from this shift:    Labs:  Recent Labs  06/29/15 0822 06/30/15 0625 07/01/15 0600  WBC  --  9.0 12.0*  HGB  --  10.9* 10.9*  PLT  --  166 156  CREATININE 1.64* 1.77* 1.93*   Estimated Creatinine Clearance: 18.5 mL/min (by C-G formula based on Cr of 1.93). No results for input(s): VANCOTROUGH, VANCOPEAK, VANCORANDOM, GENTTROUGH, GENTPEAK, GENTRANDOM, TOBRATROUGH, TOBRAPEAK, TOBRARND, AMIKACINPEAK, AMIKACINTROU, AMIKACIN in the last 72 hours.   Microbiology: Recent Results (from the past 720 hour(s))  MRSA PCR Screening     Status: None   Collection Time: 06/26/15  2:37 PM  Result Value Ref Range Status   MRSA by PCR NEGATIVE NEGATIVE Final    Comment:        The GeneXpert MRSA Assay (FDA approved for NASAL specimens only), is one component of a comprehensive MRSA colonization surveillance program. It is not intended to diagnose MRSA infection nor to guide or monitor treatment for MRSA infections.   Culture, blood (routine x 2)     Status: None (Preliminary result)   Collection Time: 06/26/15  3:20 PM  Result Value Ref Range Status   Specimen Description BLOOD [facility]  Final   Special Requests IN PEDIATRIC BOTTLE 3 CC  Final   Culture   Final    NO GROWTH 4 DAYS Performed at [facility]    Report Status PENDING  Incomplete  Culture, blood (routine x 2)     Status: None (Preliminary result)   Collection  Time: 06/26/15  3:25 PM  Result Value Ref Range Status   Specimen Description BLOOD LEFT WRIST  Final   Special Requests IN PEDIATRIC BOTTLE 2 CC  Final   Culture   Final    NO GROWTH 4 DAYS Performed at [facility]    Report Status PENDING  Incomplete    Anti-infectives    Start     Dose/Rate Route Frequency Ordered Stop   06/29/15 1600  piperacillin-tazobactam (ZOSYN) IVPB 3.375 g     3.375 g 12.5 mL/hr over 240 Minutes Intravenous 3 times per day 06/29/15 1406     06/26/15 1500  piperacillin-tazobactam (ZOSYN) IVPB 2.25 g  Status:  Discontinued     2.25 g 100 mL/hr over 30 Minutes Intravenous Every 6 hours 06/26/15 1429 06/29/15 1406     Assessment: 89 yoF ALF resident with worsening SHOB, 4 day hx of diarrhea, in acute renal failure. Aphasic episode in ED, head CT negative. CXray: no acute lung process. Pharmacy requested to dose Zosyn.  Today, 07/01/2015: - afeb - WBC up 12 (on IV steroid) - SCr trending up 1.93, CrCl ~18 ml/min  8/19 >> Zosyn  >>    8/19 blood:NGTD 8/19 MRSA PCR: neg  Goal of Therapy:  Appropriate antibiotic for treatment, dose/schedule appropriate for renal function  Plan:  Day #6 zosyn  Change zosyn to 2.25gm IV q6h for declining renal function  Monitor renal function closely, adjust dose/schedule  as needed  Please indicate plan for abx and consider narrowing if appropriate  Dorna Leitz, PharmD, BCPS 07/01/2015 7:54 AM

## 2015-07-01 NOTE — Progress Notes (Addendum)
Physical Therapy Treatment Patient Details Name: Yara Tomkinson Pavlovich MRN: [id number] DOB: [birth year] Today's Date: 07/01/2015    History of Present Illness 81 year old female resident of assisted living, comes in with confusion, generalized weakness, worsening shortness of breath    PT Comments    Progressing with mobility. Overall, pt was Min assist for mobility on today. May require increased assistance initially for safety at ALF  Follow Up Recommendations  Home health PT vs SNF (depending on continued progress and facility's ability to provide current level of care. Pt could potentially [facility] with [facility])     Equipment Recommendations  None recommended by PT    Recommendations for Other Services       Precautions / Restrictions Precautions Precautions: Fall Precaution Comments: O2 dep Restrictions Weight Bearing Restrictions: No    Mobility  Bed Mobility Overal bed mobility: Needs Assistance Bed Mobility: Supine to Sit;Sit to Supine     Supine to sit: Min guard Sit to supine: Min guard   General bed mobility comments: close guard for safety  Transfers Overall transfer level: Needs assistance Equipment used: Rolling walker (2 wheeled) Transfers: Sit to/from Stand Sit to Stand: Min assist         General transfer comment: Assist to rise, stabilize, control descent. VCS safety, technique, hand placement  Ambulation/Gait Ambulation/Gait assistance: Min assist Ambulation Distance (Feet): 175 Feet Assistive device: Rolling walker (2 wheeled) Gait Pattern/deviations: Step-through pattern;Trunk flexed;Decreased stride length     General Gait Details: assist to stabilize. remained on 2L O2. Pt tolerated activity well.    Stairs            Wheelchair Mobility    Modified Rankin (Stroke Patients Only)       Balance                                    Cognition Arousal/Alertness: Awake/alert Behavior During Therapy: WFL for  tasks assessed/performed Overall Cognitive Status: Within Functional Limits for tasks assessed                      Exercises      General Comments        Pertinent Vitals/Pain Pain Assessment: No/denies pain    Home Living                      Prior Function            PT Goals (current goals can now be found in the care plan section) Progress towards PT goals: Progressing toward goals    Frequency  Min 3X/week    PT Plan Current plan remains appropriate    Co-evaluation             End of Session Equipment Utilized During Treatment: Gait belt;Oxygen Activity Tolerance: Patient tolerated treatment well Patient left: in bed;with call bell/phone within reach;with bed alarm set     Time: 4098-1191 PT Time Calculation (min) (ACUTE ONLY): 16 min  Charges:  $Gait Training: 8-22 mins                    G Codes:      Rebeca Alert, MPT Pager: [phone]

## 2015-07-02 DIAGNOSIS — J441 Chronic obstructive pulmonary disease with (acute) exacerbation: Principal | ICD-10-CM

## 2015-07-02 LAB — GLUCOSE, CAPILLARY
GLUCOSE-CAPILLARY: 99 mg/dL (ref 65–99)
GLUCOSE-CAPILLARY: 84 mg/dL (ref 65–99)
GLUCOSE-CAPILLARY: 148 mg/dL — AB (ref 65–99)
Glucose-Capillary: 113 mg/dL — ABNORMAL HIGH (ref 65–99)
Glucose-Capillary: 142 mg/dL — ABNORMAL HIGH (ref 65–99)
Glucose-Capillary: 92 mg/dL (ref 65–99)

## 2015-07-02 LAB — BASIC METABOLIC PANEL
ANION GAP: 6 (ref 5–15)
BUN: 50 mg/dL — ABNORMAL HIGH (ref 6–20)
CALCIUM: 9.5 mg/dL (ref 8.9–10.3)
CO2: 30 mmol/L (ref 22–32)
Chloride: 106 mmol/L (ref 101–111)
Creatinine, Ser: 1.79 mg/dL — ABNORMAL HIGH (ref 0.44–1.00)
GFR, EST AFRICAN AMERICAN: 28 mL/min — AB (ref >60)
GFR, EST NON AFRICAN AMERICAN: 24 mL/min — AB (ref >60)
Glucose, Bld: 97 mg/dL (ref 65–99)
POTASSIUM: 5 mmol/L (ref 3.5–5.1)
Sodium: 142 mmol/L (ref 135–145)

## 2015-07-02 MED ORDER — LEVOFLOXACIN 500 MG PO TABS: 500.0000 mg | ORAL_TABLET | ORAL | Status: DC

## 2015-07-02 MED ORDER — DILTIAZEM HCL 30 MG PO TABS: 30.0000 mg | ORAL_TABLET | Freq: Three times a day (TID) | ORAL | Status: DC

## 2015-07-02 MED ORDER — PREDNISONE 20 MG PO TABS: 40.0000 mg | ORAL_TABLET | Freq: Every day | ORAL | Status: DC

## 2015-07-02 NOTE — Discharge Summary (Addendum)
Physician Discharge Summary  Andrea Weber [place]ines ZOX:[id number] DOB: 13-Jul-1926 DOA: 06/26/2015  PCP: [name], MD  Admit date: 06/26/2015 Discharge date: 07/03/2015  Time spent: > 35 minutes  Recommendations for Outpatient Follow-up:  1. Follow up with Dr. Redmond School in 1 week 2. Nightly CPAP, sleep study as an outpatient  3. To continue Levofloxacin for 4 additional days and Prednisone taper  Discharge Diagnoses:  Active Problems:   Altered mental status   Acute respiratory failure with hypercapnia   Acute encephalopathy   AKI (acute kidney injury)  Discharge Condition: stable  Diet recommendation: regular  Filed Weights   06/27/15 0453 06/28/15 0448 06/30/15 0500  Weight: 60.7 kg (133 lb 13.1 oz) 65.5 kg (144 lb 6.4 oz) 64.1 kg (141 lb 5 oz)   History of present illness:  81 year old female resident of assisted living, comes in with confusion, generalized weakness, worsening shortness of breath. Patient has had diarrhea since Monday. Last episode of diarrhea was prior to coming in. Patient was found to be hypoxic, 80% on room air, he quickly declined requiring BiPAP support. ABG showed pH of 7.2, PCO2 of 111, bicarbonate 37. Patient also found to be in acute on chronic renal failure. Patient is a poor historian and unable to provide any meaningful history. Most of the history is compiled from the patient's chart. Patient apparently also had an episode of becoming aphasic for several minutes in the ER with her eyes open, no seizure activity was noted, no focal motor weakness was noted, patient started talking after a few minutes and was apparently back to baseline. She did not have any episodes of diarrhea in the ER.  Hospital Course:  Acute encephalopathy- likely secondary to hypercarbic and hypoxemic respiratory failure in the setting of COPD exacerbation. This has resolved on discharge and she is back to baseline, AxOx3. Out of covern for CVA, she underwent an MR of the brain  which was without acute findings.  Acute on chronic respiratory failure with hypercapnia and hypoxia due to COPD exacerbation - patient required initially BiPAP with improvement in her respiratory status. On discharge, she was comfortable on 1L [place], her wheezing has resolved and she will continue with Levofloxacin and a prednisone taper as prescribed. I suspect that she may have a component of OSA, would recommend sleep study as an outpatient and CPAP for now.  Speech therapy also evaluated to rule out aspiration and recommended Dysphagia 3 (Mech soft);Thin Multifocal atrial tachycardia - patient started on by mouth Cardizem with improvement in her heart rates, continue. She underwent a 2D echo which showed EF 65-70%, grade 1 diastolic dysfunction. Acute renal failure - patient reports seeing a nephrologist and stated that her renal failure is not new, baseline creatinine unknown, however she has remained stable while here. Her ARB was discontinued on discharge. Diarrhea - reported on admission however while hospitalized patient has had no diarrhea to collect GI pathogen panel/C. difficile, enteric precautions will be discontinued.  Awaiting insurance authorization, patient could not be discharged as planned on 8/25, patient seen today, 8/26, she is stable, no changes to the summary above.   Procedures:  2D echo   Consultations:  None   Discharge Exam: Filed Vitals:   07/02/15 0806 07/02/15 1930 07/02/15 2010 07/03/15 0508  BP:   113/56 128/56  Pulse:   77 73  Temp:   98.6 F (37 C) 98.2 F (36.8 C)  TempSrc:   Oral Oral  Resp:   18 18  Height:  Weight:      SpO2: 99% 98% 99% 100%    General: NAD Cardiovascular: RRR Respiratory: CTA biL  Discharge Instructions     Medication List    STOP taking these medications        losartan 100 MG tablet  Commonly known as:  COZAAR      TAKE these medications        acetaminophen 325 MG tablet  Commonly known as:  TYLENOL    Take 650 mg by mouth every 4 (four) hours as needed for mild pain.     albuterol 108 (90 BASE) MCG/ACT inhaler  Commonly known as:  PROVENTIL HFA;VENTOLIN HFA  Inhale 2 puffs into the lungs every 6 (six) hours as needed for wheezing or shortness of breath.     cholecalciferol 1000 UNITS tablet  Commonly known as:  VITAMIN D  Take 1,000 Units by mouth daily.     diltiazem 30 MG tablet  Commonly known as:  CARDIZEM  Take 1 tablet (30 mg total) by mouth every 8 (eight) hours.     diphenhydrAMINE 25 mg capsule  Commonly known as:  BENADRYL  Take 1 capsule (25 mg total) by mouth every 8 (eight) hours as needed for itching.     famotidine 20 MG tablet  Commonly known as:  PEPCID  Take 1 tablet (20 mg total) by mouth daily.     ferrous sulfate 325 (65 FE) MG tablet  Take 325 mg by mouth daily with breakfast.     Fluticasone-Salmeterol 250-50 MCG/DOSE Aepb  Commonly known as:  ADVAIR  Inhale 1 puff into the lungs 2 (two) times daily.     furosemide 20 MG tablet  Commonly known as:  LASIX  Take 20 mg by mouth 2 (two) times daily.     levofloxacin 500 MG tablet  Commonly known as:  LEVAQUIN  Take 1 tablet (500 mg total) by mouth every other day.     loperamide 2 MG capsule  Commonly known as:  IMODIUM  Take 2 mg by mouth as needed for diarrhea or loose stools.     loratadine 10 MG tablet  Commonly known as:  CLARITIN  Take 10 mg by mouth daily.     ondansetron 4 MG tablet  Commonly known as:  ZOFRAN  Take 4 mg by mouth every 4 (four) hours as needed for nausea or vomiting.     predniSONE 20 MG tablet  Commonly known as:  DELTASONE  Take 2 tablets (40 mg total) by mouth daily with breakfast. 40 mg daily for 2 days then 20 mg daily for 4 days then 10 mg daily for 4 days.       Follow-up Information    Follow up with [name], MD. Schedule an appointment as soon as possible for a visit in 1 week.   Specialty:  Family Medicine   Contact information:   [address] [place] [address] [phone]       The results of significant diagnostics from this hospitalization (including imaging, microbiology, ancillary and laboratory) are listed below for reference.    Significant Diagnostic Studies: Dg Chest 2 View  06/26/2015   CLINICAL DATA:  Hypoxia.  Altered mental status.  EXAM: CHEST  2 VIEW  COMPARISON:  None.  FINDINGS: Moderate cardiomegaly and pulmonary vascular congestion are noted. No evidence of frank pulmonary edema or pulmonary consolidation. No evidence of pleural effusion. Bilateral parenchymal scarring noted with surgical staples in the right  midlung field.  IMPRESSION: Cardiomegaly and pulmonary vascular congestion.  No acute findings.   Electronically Signed   By: Myles Rosenthal M.D.   On: 06/26/2015 11:53   Ct Head Wo Contrast  06/26/2015   CLINICAL DATA:  Acute mental status changes today.  EXAM: CT HEAD WITHOUT CONTRAST  TECHNIQUE: Contiguous axial images were obtained from the base of the skull through the vertex without intravenous contrast.  COMPARISON:  None available.  FINDINGS: Stable age related cerebral atrophy, ventriculomegaly and periventricular white matter disease. Benign-appearing bilateral basal ganglia calcifications. No extra-axial fluid collections are identified. No CT findings for acute hemispheric infarction or intracranial hemorrhage. No mass lesions. The brainstem and cerebellum are normal.  The bony structures are intact. The paranasal sinuses and mastoid air cells are clear. The globes are intact. Moderate atherosclerotic calcifications are noted.  IMPRESSION: Age related cerebral atrophy, ventriculomegaly and periventricular white matter disease. No acute intracranial findings or mass lesions.   Electronically Signed   By: Rudie Meyer M.D.   On: 06/26/2015 11:44   Mr Brain Wo Contrast  06/30/2015   CLINICAL DATA:  81 year old female with altered mental status. Hypoxic respiratory failure. Initial  encounter.  EXAM: MRI HEAD WITHOUT CONTRAST  TECHNIQUE: Multiplanar, multiecho pulse sequences of the brain and surrounding structures were obtained without intravenous contrast.  COMPARISON:  Head CT without contrast 06/26/2015 and earlier  FINDINGS: Cerebral volume is within normal limits for age. No restricted diffusion to suggest acute infarction. No midline shift, mass effect, evidence of mass lesion, ventriculomegaly, extra-axial collection or acute intracranial hemorrhage. Cervicomedullary junction and pituitary are within normal limits. Major intracranial vascular flow voids are within normal limits. Tortuous distal cervical ICAs.  Patchy and confluent bilateral cerebral white matter T2 and FLAIR hyperintensity. Similar mild to moderate for age T2 heterogeneity in the deep gray matter nuclei. Mild patchy T2 heterogeneity in the pons. The cerebellum appears within normal limits. No cortical encephalomalacia or chronic cerebral blood products identified.  Visible internal auditory structures appear normal. Paranasal sinuses and mastoids are clear. Postoperative changes to the left globe. Other orbit and scalp soft tissues are within normal limits. Normal bone marrow signal. Suggestion of mild degenerative spinal stenosis at C3-C4.  IMPRESSION: 1.  No acute intracranial abnormality. 2. Mild to moderate for age nonspecific signal changes in the brain, most commonly due to chronic small vessel disease.   Electronically Signed   By: Odessa Fleming M.D.   On: 06/30/2015 14:07    Microbiology: Recent Results (from the past 240 hour(s))  MRSA PCR Screening     Status: None   Collection Time: 06/26/15  2:37 PM  Result Value Ref Range Status   MRSA by PCR NEGATIVE NEGATIVE Final    Comment:        The GeneXpert MRSA Assay (FDA approved for NASAL specimens only), is one component of a comprehensive MRSA colonization surveillance program. It is not intended to diagnose MRSA infection nor to guide or monitor  treatment for MRSA infections.   Culture, blood (routine x 2)     Status: None   Collection Time: 06/26/15  3:20 PM  Result Value Ref Range Status   Specimen Description BLOOD [facility]  Final   Special Requests IN PEDIATRIC BOTTLE 3 CC  Final   Culture   Final    NO GROWTH 5 DAYS Performed at [facility]    Report Status 07/01/2015 FINAL  Final  Culture, blood (routine x 2)     Status:  None   Collection Time: 06/26/15  3:25 PM  Result Value Ref Range Status   Specimen Description BLOOD LEFT WRIST  Final   Special Requests IN PEDIATRIC BOTTLE 2 CC  Final   Culture   Final    NO GROWTH 5 DAYS Performed at [facility]    Report Status 07/01/2015 FINAL  Final     Labs: Basic Metabolic Panel:  Recent Labs Lab 06/26/15 1035 06/26/15 1520 06/27/15 0135 06/28/15 0656 06/29/15 0822 06/30/15 0625 07/01/15 0600 07/02/15 0455  NA 142  --  143 142 145 146* 142 142  K 4.6  --  5.1 4.6 4.2 4.2 4.1 5.0  CL 96*  --  99* 101 106 106 104 106  CO2 37*  --  41* 35* 33* 34* 32 30  GLUCOSE 133*  --  113* 129* 109* 137* 144* 97  BUN 34*  --  35* 44* 34* 42* 50* 50*  CREATININE 2.22* 2.11* 2.07* 1.92* 1.64* 1.77* 1.93* 1.79*  CALCIUM 9.3  --  9.1 9.5 9.8 9.4 9.6 9.5  MG  --  2.2  --   --  2.3  --   --   --    Liver Function Tests:  Recent Labs Lab 06/27/15 0135 06/28/15 0656 06/29/15 0822 06/30/15 0625 07/01/15 0600  AST 28 22 20  14* 11*  ALT 23 18 15  12* 13*  ALKPHOS 62 63 62 47 50  BILITOT 0.8 0.5 0.7 0.6 0.6  PROT 6.7 7.1 6.9 5.5* 5.6*  ALBUMIN 3.5 3.5 3.6 2.8* 2.9*    Recent Labs Lab 06/26/15 1035  LIPASE 24   CBC:  Recent Labs Lab 06/26/15 1035 06/26/15 1520 06/27/15 0135 06/28/15 0656 06/30/15 0625 07/01/15 0600  WBC 9.2 6.8 5.1 12.1* 9.0 12.0*  NEUTROABS 8.0*  --   --   --   --   --   HGB 11.2* 11.4* 10.4* 11.4* 10.9* 10.9*  HCT 35.9* 36.0 33.7* 35.5* 33.0* 35.8*  MCV 98.4 98.4 97.1 92.9 93.2 94.7  PLT 151 146* 133* 166 166 156   Cardiac  Enzymes:  Recent Labs Lab 06/26/15 1035 06/26/15 1520 06/26/15 1950 06/27/15 0135  TROPONINI 0.03 0.10* 0.10* 0.09*   BNP: BNP (last 3 results)  Recent Labs  06/26/15 1035  BNP 114.7*   CBG:  Recent Labs Lab 07/02/15 1650 07/02/15 2006 07/03/15 0036 07/03/15 0501 07/03/15 0726  GLUCAP 142* 148* 97 84 82   Signed:  Kaleel Schmieder  [facility] 07/03/2015, 8:41 AM

## 2015-07-02 NOTE — Care Management Note (Signed)
Case Management Note  Patient Details  Name: Andrea Weber MRN: [id number] Date of Birth: [birth year]  Subjective/Objective:                    Action/Plan:d/c snf.   Expected Discharge Date:   (UNKNOWN)               Expected Discharge Plan:  Skilled Nursing Facility  In-House Referral:  Clinical Social Work  Discharge planning Services  CM Consult  Post Acute Care Choice:    Choice offered to:     DME Arranged:    DME Agency:     HH Arranged:    HH Agency:     Status of Service:  Completed, signed off  Medicare Important Message Given:  Yes-second notification given Date Medicare IM Given:    Medicare IM give by:    Date Additional Medicare IM Given:    Additional Medicare Important Message give by:     If discussed at Long Length of Stay Meetings, dates discussed:    Additional Comments:  Lanier Clam, RN 07/02/2015, 11:50 AM

## 2015-07-02 NOTE — Progress Notes (Signed)
Pt placed on BIPAP 12/5 with 2 LPM O2 bleed in via FFM.  Pt tolerating well at this time, RT to monitor and assess as needed.

## 2015-07-02 NOTE — Clinical Social Work Placement (Signed)
Patient is set to discharge to [facility] SNF today. Patient & guardian, Marilu Favre aware. Discharge packet given to RN, Catie. [facility] will be called for transport once [facility] authorization obtained.     Lincoln Maxin, LCSW [facility] Clinical Social Worker cell #: [phone]    CLINICAL SOCIAL WORK PLACEMENT  NOTE  Date:  07/02/2015  Patient Details  Name: Andrea Weber MRN: [id number] Date of Birth: September 25, 1926  Clinical Social Work is seeking post-discharge placement for this patient at the Skilled  Nursing Facility level of care (*CSW will initial, date and re-position this form in  chart as items are completed):  No   Patient/family provided with [facility] Clinical Social Work Department's list of facilities offering this level of care within the geographic area requested by the patient (or if unable, by the patient's family).  Yes   Patient/family informed of their freedom to choose among providers that offer the needed level of care, that participate in Medicare, Medicaid or managed care program needed by the patient, have an available bed and are willing to accept the patient.  No   Patient/family informed of [place]'s ownership interest in [facility] and [facility], as well as of the fact that they are under no obligation to receive care at these facilities.  PASRR submitted to EDS on 07/01/15     PASRR number received on 07/01/15     Existing PASRR number confirmed on       FL2 transmitted to all facilities in geographic area requested by pt/family on 07/01/15     FL2 transmitted to all facilities within larger geographic area on       Patient informed that his/her managed care company has contracts with or will negotiate with certain facilities, including the following:        Yes   Patient/family informed of bed offers received.  Patient chooses bed at [facility]      Physician recommends and patient chooses bed at      Patient to be transferred to [facility] on 07/02/15.  Patient to be transferred to facility by [facility]     Patient family notified on 07/02/15 of transfer.  Name of family member notified:  patient's guardian, Marilu Favre via phone     PHYSICIAN       Additional Comment:    _______________________________________________ Arlyss Repress, LCSW 07/02/2015, 10:34 AM

## 2015-07-03 LAB — GLUCOSE, CAPILLARY
GLUCOSE-CAPILLARY: 82 mg/dL (ref 65–99)
Glucose-Capillary: 118 mg/dL — ABNORMAL HIGH (ref 65–99)
Glucose-Capillary: 84 mg/dL (ref 65–99)
Glucose-Capillary: 97 mg/dL (ref 65–99)

## 2015-07-03 NOTE — Progress Notes (Signed)
CSW assessed by Shanda Bumps at [facility], but they are recommending that patient go to SNF before returning to ALF. [facility] Medicare authorization obtained.   Patient is set to discharge to [facility] SNF today. Patient & guardian, Marilu Favre aware. Discharge packet given to RN, Catie. [facility] called for transport.     Lincoln Maxin, LCSW [facility] Clinical Social Worker cell #: [phone]

## 2015-07-03 NOTE — Care Management Important Message (Signed)
Important Message  Patient Details  Name: Andrea Weber MRN: [id number] Date of Birth: 27-Oct-1926   Medicare Important Message Given:  Yes-third notification given    Haskell Flirt 07/03/2015, 1:21 PMImportant Message  Patient Details  Name: Andrea Weber MRN: [id number] Date of Birth: 04/21/1926   Medicare Important Message Given:  Yes-third notification given    Haskell Flirt 07/03/2015, 1:21 PM

## 2015-07-06 ENCOUNTER — Non-Acute Institutional Stay (SKILLED_NURSING_FACILITY): Payer: Medicare HMO | Admitting: Internal Medicine

## 2015-07-06 DIAGNOSIS — J449 Chronic obstructive pulmonary disease, unspecified: Secondary | ICD-10-CM

## 2015-07-06 DIAGNOSIS — R4182 Altered mental status, unspecified: Secondary | ICD-10-CM | POA: Diagnosis not present

## 2015-07-06 DIAGNOSIS — N189 Chronic kidney disease, unspecified: Secondary | ICD-10-CM | POA: Diagnosis not present

## 2015-07-06 DIAGNOSIS — I471 Supraventricular tachycardia: Secondary | ICD-10-CM | POA: Diagnosis not present

## 2015-07-07 ENCOUNTER — Encounter: Payer: Self-pay | Admitting: Internal Medicine

## 2015-07-07 DIAGNOSIS — I471 Supraventricular tachycardia: Secondary | ICD-10-CM | POA: Insufficient documentation

## 2015-07-07 DIAGNOSIS — J449 Chronic obstructive pulmonary disease, unspecified: Secondary | ICD-10-CM | POA: Insufficient documentation

## 2015-07-07 DIAGNOSIS — N184 Chronic kidney disease, stage 4 (severe): Secondary | ICD-10-CM | POA: Insufficient documentation

## 2015-07-07 NOTE — Progress Notes (Signed)
Patient ID: Andrea Weber, female   DOB: Sep 03, 1926, 81 y.o.   MRN: [id number]    HISTORY AND PHYSICAL   DATE: 07/06/15  Location:  [facility]    Place of Service: SNF (31)   Extended Emergency Contact Information Primary Emergency Contact: Mattocks,Clarence Address: [address]          [place], [place] [address] [place] Home Phone: [phone] Work Phone: [phone] Relation: Other Secondary Emergency Contact: Hetty [place] Mobile Phone: [phone] Relation: Other  Advanced Directive information   FULL CODE   Chief Complaint  Patient presents with  . New Admit To SNF    HPI:  81 yo female seen today as a new admission into SNF following hospital stay for COPD exacerbation with acute/chronic respiratory failure with hypoxia and hypercapnia, altered mental status with acute encephalopathy, A/CKD, MAT. MRI brain revealed no acute changes but did reveal mild-mod chronic small vessel disease. 2D echo EF 65-70% with grade 1 diastolic dysfunction. ARB stopped due to AKI. She has a nephrologist. She was started on cardizem for MAT. H/H 10.9/35.8 at d/c with Cr 1.93 and albumin 2.9. CXR showed cardiomegaly and pulmonary vascular congestion but no acute process  Today she feels better. Has SOB and is on [place] O2. No palpitations. No f/c. She is on a prednisone taper. Will complete levaquin on 8/31st. No nursing issues. No falls. She will need short term rehab with PT/OT  COPD/sleep apnea - now stable on advair, albuterol HFA. She also takes claritin. BiPAP qhs   MAT - improved rate on cardizem  CKD/edema/HTN - takes lasix. She has taken ARB in the past.  Dyspepsia - stable on pepcid and prn zofran  She takes iron supplement for anemia  Past Medical History  Diagnosis Date  . Subarachnoid hemorrhage   . Asthma   . Hypertension     Past Surgical History  Procedure Laterality Date  . Lung surgery      . Abdominal hysterectomy      Patient Care Team: [name], MD as PCP - General (Family Medicine)  Social History   Social History  . Marital Status: Widowed    Spouse Name: N/A  . Number of Children: N/A  . Years of Education: N/A   Occupational History  . Not on file.   Social History Main Topics  . Smoking status: Former Smoker    Types: Cigarettes  . Smokeless tobacco: Never Used  . Alcohol Use: No  . Drug Use: No  . Sexual Activity: Not Currently    Birth Control/ Protection: Post-menopausal   Other Topics Concern  . Not on file   Social History Narrative     reports that she has quit smoking. Her smoking use included Cigarettes. She has never used smokeless tobacco. She reports that she does not drink alcohol or use illicit drugs.  Family History  Problem Relation Age of Onset  . Hypertension Mother   . Hypertension Father    No family status information on file.     There is no immunization history on file for this patient.  No Known Allergies  Medications: Patient's Medications  New Prescriptions   No medications on file  Previous Medications   ACETAMINOPHEN (TYLENOL) 325 MG TABLET    Take 650 mg by mouth every 4 (four) hours as needed for mild pain.   ALBUTEROL (PROVENTIL HFA;VENTOLIN HFA) 108 (90 BASE) MCG/ACT INHALER    Inhale 2 puffs  into the lungs every 6 (six) hours as needed for wheezing or shortness of breath.   CHOLECALCIFEROL (VITAMIN D) 1000 UNITS TABLET    Take 1,000 Units by mouth daily.   DILTIAZEM (CARDIZEM) 30 MG TABLET    Take 1 tablet (30 mg total) by mouth every 8 (eight) hours.   DIPHENHYDRAMINE (BENADRYL) 25 MG CAPSULE    Take 1 capsule (25 mg total) by mouth every 8 (eight) hours as needed for itching.   FAMOTIDINE (PEPCID) 20 MG TABLET    Take 1 tablet (20 mg total) by mouth daily.   FERROUS SULFATE 325 (65 FE) MG TABLET    Take 325 mg by mouth daily with breakfast.   FLUTICASONE-SALMETEROL (ADVAIR) 250-50 MCG/DOSE AEPB     Inhale 1 puff into the lungs 2 (two) times daily.   FUROSEMIDE (LASIX) 20 MG TABLET    Take 20 mg by mouth 2 (two) times daily.    LEVOFLOXACIN (LEVAQUIN) 500 MG TABLET    Take 1 tablet (500 mg total) by mouth every other day.   LOPERAMIDE (IMODIUM) 2 MG CAPSULE    Take 2 mg by mouth as needed for diarrhea or loose stools.   LORATADINE (CLARITIN) 10 MG TABLET    Take 10 mg by mouth daily.   ONDANSETRON (ZOFRAN) 4 MG TABLET    Take 4 mg by mouth every 4 (four) hours as needed for nausea or vomiting.   PREDNISONE (DELTASONE) 20 MG TABLET    Take 2 tablets (40 mg total) by mouth daily with breakfast. 40 mg daily for 2 days then 20 mg daily for 4 days then 10 mg daily for 4 days.  Modified Medications   No medications on file  Discontinued Medications   No medications on file    Review of Systems  Constitutional: Negative for fever, chills, diaphoresis, activity change, appetite change and fatigue.  HENT: Negative for ear pain, sore throat and trouble swallowing.   Eyes: Negative for visual disturbance.  Respiratory: Positive for shortness of breath. Negative for cough and chest tightness.   Cardiovascular: Negative for chest pain, palpitations and leg swelling.  Gastrointestinal: Negative for nausea, vomiting, abdominal pain, diarrhea, constipation and blood in stool.  Genitourinary: Negative for dysuria.  Musculoskeletal: Negative for arthralgias.  Neurological: Negative for dizziness, tremors, numbness and headaches.  Psychiatric/Behavioral: Negative for sleep disturbance. The patient is not nervous/anxious.     Filed Vitals:   07/06/15 1609  BP: 156/85  Pulse: 107  Temp: 97.6 F (36.4 C)  SpO2: 94% on [place] O2   There is no weight on file to calculate BMI.  Physical Exam  Constitutional: She is oriented to person, place, and time. She appears well-developed and well-nourished. No distress.  Sitting in bed in NAD. No conversational dyspnea. [place] O2 intact  HENT:  Mouth/Throat:  Oropharynx is clear and moist. No oropharyngeal exudate.  Eyes: Pupils are equal, round, and reactive to light. No scleral icterus.  Neck: Neck supple. Carotid bruit is not present. No tracheal deviation present. No thyromegaly present.  Cardiovascular: Intact distal pulses.  An irregularly irregular rhythm present. Tachycardia present.  Exam reveals no gallop and no friction rub.   Murmur heard.  Systolic murmur is present with a grade of 1/6  No LE edema b/l. no calf TTP.   Pulmonary/Chest: Effort normal. No stridor. No respiratory distress. She has decreased breath sounds (b/l). She has no wheezes. She has no rhonchi. She has rales (inspiratory rales L>R).  Abdominal: Soft. Bowel sounds  are normal. She exhibits no distension and no mass. There is no hepatomegaly. There is no tenderness. There is no rebound and no guarding.  Lymphadenopathy:    She has no cervical adenopathy.  Neurological: She is alert and oriented to person, place, and time.  Skin: Skin is warm and dry. No rash noted.  Psychiatric: She has a normal mood and affect. Her behavior is normal. Judgment and thought content normal.     Labs reviewed: Admission on 06/26/2015, Discharged on 07/03/2015  No results displayed because visit has over 200 results.  CBC Latest Ref Rng 07/01/2015 06/30/2015 06/28/2015  WBC 4.0 - 10.5 K/uL 12.0(H) 9.0 12.1(H)  Hemoglobin 12.0 - 15.0 g/dL 10.9(L) 10.9(L) 11.4(L)  Hematocrit 36.0 - 46.0 % 35.8(L) 33.0(L) 35.5(L)  Platelets 150 - 400 K/uL 156 166 166    CMP Latest Ref Rng 07/02/2015 07/01/2015 06/30/2015  Glucose 65 - 99 mg/dL 97 161(W) 960(A)  BUN 6 - 20 mg/dL 54(U) 98(J) 19(J)  Creatinine 0.44 - 1.00 mg/dL 4.78(G) 9.56(O) 1.30(Q)  Sodium 135 - 145 mmol/L 142 142 146(H)  Potassium 3.5 - 5.1 mmol/L 5.0 4.1 4.2  Chloride 101 - 111 mmol/L 106 104 106  CO2 22 - 32 mmol/L 30 32 34(H)  Calcium 8.9 - 10.3 mg/dL 9.5 9.6 9.4  Total Protein 6.5 - 8.1 g/dL - 5.6(L) 5.5(L)  Total Bilirubin 0.3 -  1.2 mg/dL - 0.6 0.6  Alkaline Phos 38 - 126 U/L - 50 47  AST 15 - 41 U/L - 11(L) 14(L)  ALT 14 - 54 U/L - 13(L) 12(L)        Dg Chest 2 View  06/26/2015   CLINICAL DATA:  Hypoxia.  Altered mental status.  EXAM: CHEST  2 VIEW  COMPARISON:  None.  FINDINGS: Moderate cardiomegaly and pulmonary vascular congestion are noted. No evidence of frank pulmonary edema or pulmonary consolidation. No evidence of pleural effusion. Bilateral parenchymal scarring noted with surgical staples in the right midlung field.  IMPRESSION: Cardiomegaly and pulmonary vascular congestion.  No acute findings.   Electronically Signed   By: Myles Rosenthal M.D.   On: 06/26/2015 11:53   Ct Head Wo Contrast  06/26/2015   CLINICAL DATA:  Acute mental status changes today.  EXAM: CT HEAD WITHOUT CONTRAST  TECHNIQUE: Contiguous axial images were obtained from the base of the skull through the vertex without intravenous contrast.  COMPARISON:  None available.  FINDINGS: Stable age related cerebral atrophy, ventriculomegaly and periventricular white matter disease. Benign-appearing bilateral basal ganglia calcifications. No extra-axial fluid collections are identified. No CT findings for acute hemispheric infarction or intracranial hemorrhage. No mass lesions. The brainstem and cerebellum are normal.  The bony structures are intact. The paranasal sinuses and mastoid air cells are clear. The globes are intact. Moderate atherosclerotic calcifications are noted.  IMPRESSION: Age related cerebral atrophy, ventriculomegaly and periventricular white matter disease. No acute intracranial findings or mass lesions.   Electronically Signed   By: Rudie Meyer M.D.   On: 06/26/2015 11:44   Mr Brain Wo Contrast  06/30/2015   CLINICAL DATA:  81 year old female with altered mental status. Hypoxic respiratory failure. Initial encounter.  EXAM: MRI HEAD WITHOUT CONTRAST  TECHNIQUE: Multiplanar, multiecho pulse sequences of the brain and surrounding  structures were obtained without intravenous contrast.  COMPARISON:  Head CT without contrast 06/26/2015 and earlier  FINDINGS: Cerebral volume is within normal limits for age. No restricted diffusion to suggest acute infarction. No midline shift, mass effect, evidence of mass  lesion, ventriculomegaly, extra-axial collection or acute intracranial hemorrhage. Cervicomedullary junction and pituitary are within normal limits. Major intracranial vascular flow voids are within normal limits. Tortuous distal cervical ICAs.  Patchy and confluent bilateral cerebral white matter T2 and FLAIR hyperintensity. Similar mild to moderate for age T2 heterogeneity in the deep gray matter nuclei. Mild patchy T2 heterogeneity in the pons. The cerebellum appears within normal limits. No cortical encephalomalacia or chronic cerebral blood products identified.  Visible internal auditory structures appear normal. Paranasal sinuses and mastoids are clear. Postoperative changes to the left globe. Other orbit and scalp soft tissues are within normal limits. Normal bone marrow signal. Suggestion of mild degenerative spinal stenosis at C3-C4.  IMPRESSION: 1.  No acute intracranial abnormality. 2. Mild to moderate for age nonspecific signal changes in the brain, most commonly due to chronic small vessel disease.   Electronically Signed   By: Odessa Fleming M.D.   On: 06/30/2015 14:07     Assessment/Plan   ICD-9-CM ICD-10-CM   1. Chronic obstructive pulmonary disease, unspecified COPD, unspecified chronic bronchitis type s/p exacerbation with acute on chronic respiratory failure 496 J44.9   2. Multifocal atrial tachycardia - rate borderline controlled 427.89 I47.1   3. CKD (chronic kidney disease), unspecified stage stable s/p AKI 585.9 N18.9   4. Altered mental status, unspecified altered mental status type - resolved 780.97 R41.82     --check CMP  --t/c resuming ARB if BP remains elevated  --cont other meds as ordered  --BiPAP qhs.  Will need O/P sleep study  --complete levaquin and prednisone taper  --f/u with nephrology as scheduled  --PT/OT/ST as indicated  --cont [place] O2 as ordered  --GOAL: short term rehab and d/c home when medically appropriate. Communicated with pt and nursing.  --will follow  Hanif Radin S. Ancil Linsey  [facility] [address] [place], [place] [address] [phone] Cell (Monday-Friday 8 AM - 5 PM) [phone] After 5 PM and follow prompts

## 2015-08-12 ENCOUNTER — Non-Acute Institutional Stay (SKILLED_NURSING_FACILITY): Payer: Medicare HMO | Admitting: Adult Health

## 2015-08-12 DIAGNOSIS — J449 Chronic obstructive pulmonary disease, unspecified: Secondary | ICD-10-CM | POA: Diagnosis not present

## 2015-08-12 DIAGNOSIS — K219 Gastro-esophageal reflux disease without esophagitis: Secondary | ICD-10-CM

## 2015-08-12 DIAGNOSIS — D638 Anemia in other chronic diseases classified elsewhere: Secondary | ICD-10-CM | POA: Diagnosis not present

## 2015-08-12 DIAGNOSIS — N183 Chronic kidney disease, stage 3 (moderate): Secondary | ICD-10-CM | POA: Diagnosis not present

## 2015-08-12 DIAGNOSIS — I471 Supraventricular tachycardia: Secondary | ICD-10-CM | POA: Diagnosis not present

## 2015-08-12 DIAGNOSIS — I4719 Other supraventricular tachycardia: Secondary | ICD-10-CM

## 2015-08-12 DIAGNOSIS — J9612 Chronic respiratory failure with hypercapnia: Secondary | ICD-10-CM

## 2015-08-12 DIAGNOSIS — J309 Allergic rhinitis, unspecified: Secondary | ICD-10-CM | POA: Diagnosis not present

## 2015-09-03 ENCOUNTER — Encounter: Payer: Self-pay | Admitting: Adult Health

## 2015-09-03 DIAGNOSIS — J9612 Chronic respiratory failure with hypercapnia: Secondary | ICD-10-CM | POA: Insufficient documentation

## 2015-09-03 DIAGNOSIS — K219 Gastro-esophageal reflux disease without esophagitis: Secondary | ICD-10-CM | POA: Insufficient documentation

## 2015-09-03 DIAGNOSIS — J309 Allergic rhinitis, unspecified: Secondary | ICD-10-CM | POA: Insufficient documentation

## 2015-09-03 DIAGNOSIS — D638 Anemia in other chronic diseases classified elsewhere: Secondary | ICD-10-CM | POA: Insufficient documentation

## 2015-09-03 HISTORY — DX: Gastro-esophageal reflux disease without esophagitis: K21.9

## 2015-09-03 NOTE — Progress Notes (Signed)
Patient ID: Andrea Weber, female   DOB: 07-30-1926, 81 y.o.   MRN: [id number]    Facility: [facility]      No Known Allergies  Chief Complaint  Patient presents with  . Medical Management of Chronic Issues    HPI:  She is a long term resident of this facility being seen for the management of her chronic illnesses. She is doing well. She is not voicing any complaints or concerns at this time. There are no nursing concerns at this time.    Past Medical History  Diagnosis Date  . Subarachnoid hemorrhage (HCC)   . Asthma   . Hypertension     Past Surgical History  Procedure Laterality Date  . Lung surgery    . Abdominal hysterectomy      VITAL SIGNS BP 118/71 mmHg  Pulse 96  Ht  (1.549 m)  Wt 124 lb (56.246 kg)  BMI 23.44 kg/m2  SpO2 98%  Patient's Medications  New Prescriptions   No medications on file  Previous Medications   ALBUTEROL (PROVENTIL HFA;VENTOLIN HFA) 108 (90 BASE) MCG/ACT INHALER    Inhale 2 puffs into the lungs every 6 (six) hours as needed for wheezing or shortness of breath.   CHOLECALCIFEROL (VITAMIN D) 1000 UNITS TABLET    Take 1,000 Units by mouth daily.   DILTIAZEM (CARDIZEM) 30 MG TABLET    Take 1 tablet (30 mg total) by mouth every 8 (eight) hours.   DIPHENHYDRAMINE (BENADRYL) 25 MG CAPSULE    Take 1 capsule (25 mg total) by mouth every 8 (eight) hours as needed for itching.   FAMOTIDINE (PEPCID) 20 MG TABLET    Take 1 tablet (20 mg total) by mouth daily.   FERROUS SULFATE 325 (65 FE) MG TABLET    Take 325 mg by mouth daily with breakfast.   FLUTICASONE-SALMETEROL (ADVAIR) 250-50 MCG/DOSE AEPB    Inhale 1 puff into the lungs 2 (two) times daily.   FUROSEMIDE (LASIX) 20 MG TABLET    Take 20 mg by mouth 2 (two) times daily.    LORATADINE (CLARITIN) 10 MG TABLET    Take 10 mg by mouth daily.   ONDANSETRON (ZOFRAN) 4 MG TABLET    Take 4 mg by mouth every 4 (four) hours as needed for nausea or vomiting.  Modified  Medications   No medications on file  Discontinued Medications     SIGNIFICANT DIAGNOSTIC EXAMS  06-26-15: chest x-ray: Cardiomegaly and pulmonary vascular congestion.  No acute findings.  06-26-15: ct of head: Cardiomegaly and pulmonary vascular congestion.  No acute findings.  06-29-15: 2-d echo: - Left ventricle: The cavity size was normal. Wall thickness was increased in a pattern of mild LVH. Systolic function was vigorous. The estimated ejection fraction was in the range of 65% to 70%. Wall motion was normal; there were no regional wall motion abnormalities. Doppler parameters are consistent with abnormal left ventricular relaxation (grade 1 diastolic dysfunction). Doppler parameters are consistent with high ventricular filling pressure. - Right ventricle: Systolic function was mildly reduced. - Pulmonary arteries: PA peak pressure: 36 mm Hg (S). - Pericardium, extracardiac: A trivial pericardial effusion was identified.  06-30-15: mri of brain: 1.  No acute intracranial abnormality. 2. Mild to moderate for age nonspecific signal changes in the brain, most commonly due to chronic small vessel disease.   LABS REVIEWED:   06-26-15; wbc 9.2; hgb 11.2; hct 35.9; mcv 98.4; plt 151; glucose 133; bun 34; creat 2.22; k+ 4.6; na++142;  ast 54; albumin 3.9; mag 2.2; tsh 0.626; hgb a1c 4.9; blood culture: no growth 06-28-15; wbc 12.1; hgb 11.4; hct 35.5; mcv 92.9; plt 166; glucose 129; bun 44; creat 1.92; k+ 4.6; na++142; liver normal albumin 3.5 07-01-15: wbc 12.0; hgb 10.9; hct 35.8; mcv 94.7; plt 156; glucose 144; bun 50; creat 1.93; k+ 4.1; na++142; liver normal albumin 2.9  07-02-15: glucose 97; bun 50; creat 1.79; k+ 5.0; na++142  07-08-15; glucose 144; bun 34.9; creat 1.54; k+ 4.2; na++147; liver normal albumin 3.1        Review of Systems  Constitutional: Negative for appetite change and fatigue.  HENT: Negative for congestion.   Respiratory: Negative for cough, chest tightness and  shortness of breath.   Cardiovascular: Negative for chest pain, palpitations and leg swelling.  Gastrointestinal: Negative for nausea, abdominal pain, diarrhea and constipation.  Musculoskeletal: Negative for myalgias and arthralgias.  Skin: Negative for pallor.  Psychiatric/Behavioral: The patient is not nervous/anxious.       Physical Exam  Constitutional: She is oriented to person, place, and time. No distress.  Thin   Eyes: Conjunctivae are normal.  Neck: Neck supple. No JVD present. No thyromegaly present.  Cardiovascular: Normal rate and intact distal pulses.   Murmur heard. Murmur 1/6 Heart rate irregularly irregular   Respiratory: Effort normal. No respiratory distress.  Breath sounds diminished   GI: Soft. Bowel sounds are normal. She exhibits no distension. There is no tenderness.  Musculoskeletal: She exhibits no edema.  Able to move all extremities   Lymphadenopathy:    She has no cervical adenopathy.  Neurological: She is alert and oriented to person, place, and time.  Skin: Skin is warm and dry. She is not diaphoretic.  Psychiatric: She has a normal mood and affect.     ASSESSMENT/ PLAN:  1. COPD: is stable will continue albuterol 2 puffs every 6 hours as needed; advair 250/50 twice daily will monitor  2. Chronic respiratory failure: is on bipap nightly will monitor   3. Atrial tachycardia: will continue cardizem 30 mg three times daily for rate control heart rate is presently stable  4. Edema: is presently stable will continue lasix 20 mg daily will monitor   5. Anemia: will continue iron daily hgb is 10.9  6. Allergic rhinitis: will continue claritin 10 mg daily  7. Gerd: will continue pepcid 20 mg daily   8. Chronic kidney disease: is stable her last creat is 1.54     Synthia Innocenteborah Nayelis Bonito NP [facility]  Contact [phone] Monday through Friday 8am- 5pm  After hours call [phone]

## 2015-09-10 ENCOUNTER — Encounter: Payer: Self-pay | Admitting: Internal Medicine

## 2015-09-10 ENCOUNTER — Non-Acute Institutional Stay (SKILLED_NURSING_FACILITY): Payer: Medicare HMO | Admitting: Internal Medicine

## 2015-09-10 DIAGNOSIS — D638 Anemia in other chronic diseases classified elsewhere: Secondary | ICD-10-CM | POA: Diagnosis not present

## 2015-09-10 DIAGNOSIS — N183 Chronic kidney disease, stage 3 unspecified: Secondary | ICD-10-CM

## 2015-09-10 DIAGNOSIS — R600 Localized edema: Secondary | ICD-10-CM | POA: Diagnosis not present

## 2015-09-10 NOTE — Assessment & Plan Note (Signed)
GFR 33, Cr 1.54; stable; Plan - monitor at intervals

## 2015-09-10 NOTE — Assessment & Plan Note (Signed)
Chronic and stable on lasix 20 mg daily; Plan -interval monitoring BMP

## 2015-09-10 NOTE — Assessment & Plan Note (Signed)
In 06/2015 Hb 10.9 which is stable; Plan - continue iron , 1 pill daily 325 mg

## 2015-09-10 NOTE — Progress Notes (Signed)
MRN: [id number] Name: Andrea Weber  Sex: female Age: 81 y.o. DOB: 01-29-1926  [facility] Level Of Care: SNF Provider: Merrilee SeashoreALEXANDER, Seichi Kaufhold D Emergency Contacts: Extended Emergency Contact Information Primary Emergency Contact: Mattocks,Clarence Address: [address]          [place], [place] [id number] [place] Home Phone: [phone] Work Phone: [phone] Relation: Other Secondary Emergency Contact: Hetty ElyMattocks,Martha  [place] Mobile Phone: [phone] Relation: Other  Code Status:   Allergies: Review of patient's allergies indicates no known allergies.  Chief Complaint  Patient presents with  . Medical Management of Chronic Issues    HPI: Patient is 81 y.o. female with hx SAH, HTN, COPD, GERD, CKD3, anemia who is being seen today for routine issues of anemia, CKD and LE edema.  Past Medical History  Diagnosis Date  . Subarachnoid hemorrhage (HCC)   . Asthma   . Hypertension     Past Surgical History  Procedure Laterality Date  . Lung surgery    . Abdominal hysterectomy        Medication List       This list is accurate as of: 09/10/15  2:18 PM.  Always use your most recent med list.               albuterol 108 (90 BASE) MCG/ACT inhaler  Commonly known as:  PROVENTIL HFA;VENTOLIN HFA  Inhale 2 puffs into the lungs every 6 (six) hours as needed for wheezing or shortness of breath.     cholecalciferol 1000 UNITS tablet  Commonly known as:  VITAMIN D  Take 1,000 Units by mouth daily.     diltiazem 30 MG tablet  Commonly known as:  CARDIZEM  Take 1 tablet (30 mg total) by mouth every 8 (eight) hours.     diphenhydrAMINE 25 mg capsule  Commonly known as:  BENADRYL  Take 1 capsule (25 mg total) by mouth every 8 (eight) hours as needed for itching.     famotidine 20 MG tablet  Commonly known as:  PEPCID  Take 1 tablet (20 mg total) by mouth daily.     ferrous sulfate 325 (65 FE) MG tablet   Take 325 mg by mouth daily with breakfast.     Fluticasone-Salmeterol 250-50 MCG/DOSE Aepb  Commonly known as:  ADVAIR  Inhale 1 puff into the lungs 2 (two) times daily.     furosemide 20 MG tablet  Commonly known as:  LASIX  Take 20 mg by mouth 2 (two) times daily.     loratadine 10 MG tablet  Commonly known as:  CLARITIN  Take 10 mg by mouth daily.     ondansetron 4 MG tablet  Commonly known as:  ZOFRAN  Take 4 mg by mouth every 4 (four) hours as needed for nausea or vomiting.        No orders of the defined types were placed in this encounter.     There is no immunization history on file for this patient.  Social History  Substance Use Topics  . Smoking status: Former Smoker    Types: Cigarettes  . Smokeless tobacco: Never Used  . Alcohol Use: No    Review of Systems  DATA OBTAINED: from patient, nurse GENERAL:  no fevers, fatigue, appetite changes SKIN: No itching, rash HEENT: No complaint RESPIRATORY: No cough, wheezing, SOB CARDIAC: No chest pain, palpitations, lower extremity edema  GI: No abdominal pain, No N/V/D or constipation, No heartburn or reflux  GU:  No dysuria, frequency or urgency, or incontinence  MUSCULOSKELETAL: No unrelieved bone/joint pain NEUROLOGIC: No headache, dizziness  PSYCHIATRIC: No overt anxiety or sadness  Filed Vitals:   09/10/15 1408  BP: 118/71  Pulse: 96  Temp: 96.6 F (35.9 C)  Resp: 20    Physical Exam  GENERAL APPEARANCE: Alert, conversant, delightful BF,No acute distress  SKIN: No diaphoresis rash, or wounds HEENT: Unremarkable RESPIRATORY: Breathing is even, unlabored. Lung sounds are clear   CARDIOVASCULAR: Heart RRR no murmurs, rubs or gallops. No peripheral edema  GASTROINTESTINAL: Abdomen is soft, non-tender, not distended w/ normal bowel sounds.  GENITOURINARY: Bladder non tender, not distended  MUSCULOSKELETAL: No abnormal joints or musculature NEUROLOGIC: Cranial nerves 2-12 grossly intact. Moves all  extremities PSYCHIATRIC: Mood and affect appropriate to situation,with dementia; no behavioral issues  Patient Active Problem List   Diagnosis Date Noted  . Bilateral lower extremity edema 09/10/2015  . Chronic respiratory failure with hypercapnia (HCC) 09/03/2015  . GERD without esophagitis 09/03/2015  . Allergic rhinitis due to allergen 09/03/2015  . Anemia of chronic disease 09/03/2015  . COPD (chronic obstructive pulmonary disease) (HCC) 07/07/2015  . CKD (chronic kidney disease) stage 3, GFR 30-59 ml/min 07/07/2015  . Multifocal atrial tachycardia (HCC) 07/07/2015  . Acute encephalopathy 07/01/2015  . AKI (acute kidney injury) (HCC) 07/01/2015  . Altered mental status 06/26/2015  . Acute respiratory failure with hypercapnia (HCC) 06/26/2015    CBC    Component Value Date/Time   WBC 12.0* 07/01/2015 0600   RBC 3.78* 07/01/2015 0600   HGB 10.9* 07/01/2015 0600   HCT 35.8* 07/01/2015 0600   PLT 156 07/01/2015 0600   MCV 94.7 07/01/2015 0600   LYMPHSABS 0.8 06/26/2015 1035   MONOABS 0.4 06/26/2015 1035   EOSABS 0.0 06/26/2015 1035   BASOSABS 0.0 06/26/2015 1035    CMP     Component Value Date/Time   NA 142 07/02/2015 0455   K 5.0 07/02/2015 0455   CL 106 07/02/2015 0455   CO2 30 07/02/2015 0455   GLUCOSE 97 07/02/2015 0455   BUN 50* 07/02/2015 0455   CREATININE 1.79* 07/02/2015 0455   CALCIUM 9.5 07/02/2015 0455   PROT 5.6* 07/01/2015 0600   ALBUMIN 2.9* 07/01/2015 0600   AST 11* 07/01/2015 0600   ALT 13* 07/01/2015 0600   ALKPHOS 50 07/01/2015 0600   BILITOT 0.6 07/01/2015 0600   GFRNONAA 24* 07/02/2015 0455   GFRAA 28* 07/02/2015 0455    Assessment and Plan  CKD (chronic kidney disease) stage 3, GFR 30-59 ml/min GFR 33, Cr 1.54; stable; Plan - monitor at intervals  Anemia of chronic disease In 06/2015 Hb 10.9 which is stable; Plan - continue iron , 1 pill daily 325 mg  Bilateral lower extremity edema Chronic and stable on lasix 20 mg daily; Plan  -interval monitoring BMP    Margit Hanks, MD

## 2015-10-28 ENCOUNTER — Non-Acute Institutional Stay (SKILLED_NURSING_FACILITY): Payer: Medicare HMO | Admitting: Adult Health

## 2015-10-28 DIAGNOSIS — I471 Supraventricular tachycardia: Secondary | ICD-10-CM

## 2015-10-28 DIAGNOSIS — D638 Anemia in other chronic diseases classified elsewhere: Secondary | ICD-10-CM | POA: Diagnosis not present

## 2015-10-28 DIAGNOSIS — R634 Abnormal weight loss: Secondary | ICD-10-CM

## 2015-10-28 DIAGNOSIS — J449 Chronic obstructive pulmonary disease, unspecified: Secondary | ICD-10-CM | POA: Diagnosis not present

## 2015-10-28 DIAGNOSIS — N183 Chronic kidney disease, stage 3 unspecified: Secondary | ICD-10-CM

## 2015-10-28 DIAGNOSIS — J9612 Chronic respiratory failure with hypercapnia: Secondary | ICD-10-CM

## 2015-10-28 DIAGNOSIS — R600 Localized edema: Secondary | ICD-10-CM

## 2015-10-28 DIAGNOSIS — K219 Gastro-esophageal reflux disease without esophagitis: Secondary | ICD-10-CM

## 2015-10-28 DIAGNOSIS — I4719 Other supraventricular tachycardia: Secondary | ICD-10-CM

## 2015-11-14 ENCOUNTER — Encounter: Payer: Self-pay | Admitting: Adult Health

## 2015-11-14 DIAGNOSIS — R634 Abnormal weight loss: Secondary | ICD-10-CM | POA: Insufficient documentation

## 2015-11-14 MED ORDER — FUROSEMIDE 20 MG PO TABS: 20.0000 mg | ORAL_TABLET | Freq: Every day | ORAL | Status: AC

## 2015-11-14 NOTE — Progress Notes (Signed)
Patient ID: Andrea Weber, female   DOB: [birth year], 82 y.o.   MRN: [id number]    Facility:  [facility]      No Known Allergies  Chief Complaint  Patient presents with  . Medical Management of Chronic Issues    HPI:  She is a long term resident of this facility being seen for the management of her chronic illnesses. She has lost weight from 142 pounds in August 2016 to her current weight of 123 pounds. She is not voicing any complaints or concerns at this time; but states that she does not have an appetite. There are no nursing concerns at this time.    Past Medical History  Diagnosis Date  . Subarachnoid hemorrhage (HCC)   . Asthma   . Hypertension     Past Surgical History  Procedure Laterality Date  . Lung surgery    . Abdominal hysterectomy      VITAL SIGNS BP 118/71 mmHg  Pulse 96  Ht 5\' 1"  (1.549 m)  Wt 123 lb (55.792 kg)  BMI 23.25 kg/m2  SpO2 94%  Patient's Medications  New Prescriptions   No medications on file  Previous Medications   ALBUTEROL (PROVENTIL HFA;VENTOLIN HFA) 108 (90 BASE) MCG/ACT INHALER    Inhale 2 puffs into the lungs every 6 (six) hours as needed for wheezing or shortness of breath.   CHOLECALCIFEROL (VITAMIN D) 1000 UNITS TABLET    Take 1,000 Units by mouth daily.   DILTIAZEM (CARDIZEM) 30 MG TABLET    Take 1 tablet (30 mg total) by mouth every 8 (eight) hours.   DIPHENHYDRAMINE (BENADRYL) 25 MG CAPSULE    Take 1 capsule (25 mg total) by mouth every 8 (eight) hours as needed for itching.   FAMOTIDINE (PEPCID) 20 MG TABLET    Take 1 tablet (20 mg total) by mouth daily.   FERROUS SULFATE 325 (65 FE) MG TABLET    Take 325 mg by mouth daily with breakfast.   FLUTICASONE-SALMETEROL (ADVAIR) 250-50 MCG/DOSE AEPB    Inhale 1 puff into the lungs 2 (two) times daily.   FUROSEMIDE (LASIX) 20 MG TABLET    Take 20 mg by mouth 2 (two) times daily.    LORATADINE (CLARITIN) 10 MG TABLET    Take 10 mg by mouth daily.   ONDANSETRON (ZOFRAN) 4 MG  TABLET    Take 4 mg by mouth every 4 (four) hours as needed for nausea or vomiting.  Modified Medications   No medications on file  Discontinued Medications   No medications on file     SIGNIFICANT DIAGNOSTIC EXAMS    06-26-15: chest x-ray: Cardiomegaly and pulmonary vascular congestion.  No acute findings.  06-26-15: ct of head: Cardiomegaly and pulmonary vascular congestion.  No acute findings.  06-29-15: 2-d echo: - Left ventricle: The cavity size was normal. Wall thickness was increased in a pattern of mild LVH. Systolic function was vigorous. The estimated ejection fraction was in the range of 65% to 70%. Wall motion was normal; there were no regional wall motion abnormalities. Doppler parameters are consistent with abnormal left ventricular relaxation (grade 1 diastolic dysfunction). Doppler parameters are consistent with high ventricular filling pressure. - Right ventricle: Systolic function was mildly reduced. - Pulmonary arteries: PA peak pressure: 36 mm Hg (S). - Pericardium, extracardiac: A trivial pericardial effusion was identified.  06-30-15: mri of brain: 1.  No acute intracranial abnormality. 2. Mild to moderate for age nonspecific signal changes in the brain, most commonly due to chronic  small vessel disease.   LABS REVIEWED:   06-26-15; wbc 9.2; hgb 11.2; hct 35.9; mcv 98.4; plt 151; glucose 133; bun 34; creat 2.22; k+ 4.6; na++142; ast 54; albumin 3.9; mag 2.2; tsh 0.626; hgb a1c 4.9; blood culture: no growth 06-28-15; wbc 12.1; hgb 11.4; hct 35.5; mcv 92.9; plt 166; glucose 129; bun 44; creat 1.92; k+ 4.6; na++142; liver normal albumin 3.5 07-01-15: wbc 12.0; hgb 10.9; hct 35.8; mcv 94.7; plt 156; glucose 144; bun 50; creat 1.93; k+ 4.1; na++142; liver normal albumin 2.9  07-02-15: glucose 97; bun 50; creat 1.79; k+ 5.0; na++142  07-08-15; glucose 144; bun 34.9; creat 1.54; k+ 4.2; na++147; liver normal albumin 3.1        Review of Systems  Constitutional: Negative  for appetite change and fatigue.  HENT: Negative for congestion.   Respiratory: Negative for cough, chest tightness and shortness of breath.   Cardiovascular: Negative for chest pain, palpitations and leg swelling.  Gastrointestinal: Negative for nausea, abdominal pain, diarrhea and constipation.  Musculoskeletal: Negative for myalgias and arthralgias.  Skin: Negative for pallor.  Psychiatric/Behavioral: The patient is not nervous/anxious.       Physical Exam  Constitutional: She is oriented to person, place, and time. No distress.  Thin   Eyes: Conjunctivae are normal.  Neck: Neck supple. No JVD present. No thyromegaly present.  Cardiovascular: Normal rate and intact distal pulses.   Murmur heard. Murmur 1/6 Heart rate irregularly irregular   Respiratory: Effort normal. No respiratory distress.  Breath sounds diminished   GI: Soft. Bowel sounds are normal. She exhibits no distension. There is no tenderness.  Musculoskeletal: She exhibits no edema.  Able to move all extremities   Lymphadenopathy:    She has no cervical adenopathy.  Neurological: She is alert and oriented to person, place, and time.  Skin: Skin is warm and dry. She is not diaphoretic.  Psychiatric: She has a normal mood and affect.     ASSESSMENT/ PLAN:  1. COPD: is stable will continue albuterol 2 puffs every 6 hours as needed; advair 250/50 twice daily will monitor  2. Chronic respiratory failure: is on bipap nightly will monitor   3. Atrial tachycardia: will continue cardizem 30 mg three times daily for rate control heart rate is presently stable  4. Edema: is presently stable will lower to  lasix 20 mg daily will monitor and will check bmp in one week.   5. Anemia: will continue iron daily hgb is 10.9  6. Allergic rhinitis: will continue claritin 10 mg daily  7. Gerd: will continue pepcid 20 mg daily   8. Chronic kidney disease: is stable her last creat is 1.54   9. Weight loss: will begin  remeron 7.5 mg nightly for 30 days and will monitor her status.      Synthia Innocenteborah Green NP [facility]  Contact [phone] Monday through Friday 8am- 5pm  After hours call [phone]

## 2015-11-15 ENCOUNTER — Encounter (HOSPITAL_COMMUNITY): Payer: Self-pay | Admitting: *Deleted

## 2015-11-15 ENCOUNTER — Emergency Department (HOSPITAL_COMMUNITY): Payer: Medicare HMO

## 2015-11-15 ENCOUNTER — Emergency Department (HOSPITAL_COMMUNITY)
Admission: EM | Admit: 2015-11-15 | Discharge: 2015-11-15 | Disposition: A | Discharge status: Home / Self Care | Payer: Medicare HMO | Attending: Emergency Medicine | Admitting: Emergency Medicine

## 2015-11-15 DIAGNOSIS — Z7951 Long term (current) use of inhaled steroids: Secondary | ICD-10-CM | POA: Diagnosis not present

## 2015-11-15 DIAGNOSIS — Z87891 Personal history of nicotine dependence: Secondary | ICD-10-CM | POA: Insufficient documentation

## 2015-11-15 DIAGNOSIS — F0390 Unspecified dementia without behavioral disturbance: Secondary | ICD-10-CM | POA: Insufficient documentation

## 2015-11-15 DIAGNOSIS — I10 Essential (primary) hypertension: Secondary | ICD-10-CM | POA: Diagnosis not present

## 2015-11-15 DIAGNOSIS — J45901 Unspecified asthma with (acute) exacerbation: Secondary | ICD-10-CM | POA: Insufficient documentation

## 2015-11-15 DIAGNOSIS — Z79899 Other long term (current) drug therapy: Secondary | ICD-10-CM | POA: Diagnosis not present

## 2015-11-15 DIAGNOSIS — Z9889 Other specified postprocedural states: Secondary | ICD-10-CM | POA: Insufficient documentation

## 2015-11-15 DIAGNOSIS — Z9981 Dependence on supplemental oxygen: Secondary | ICD-10-CM | POA: Insufficient documentation

## 2015-11-15 DIAGNOSIS — R0902 Hypoxemia: Secondary | ICD-10-CM | POA: Diagnosis present

## 2015-11-15 DIAGNOSIS — R0602 Shortness of breath: Secondary | ICD-10-CM

## 2015-11-15 NOTE — ED Notes (Signed)
Attempted to call report to [facility] twice, phone #[phone]. No answer.

## 2015-11-15 NOTE — ED Provider Notes (Signed)
CSN: [id number]     Arrival date & time 11/15/15  0957 History   First MD Initiated Contact with Patient 11/15/15 1011     Chief Complaint  Patient presents with  . Evaluation for decreased 02      HPI Pt resides at [facility] [address], [place]. Pt is on continuous 02, pt without 02, "plug me back to my oxygen" they did not, instead called 911. sats 84% on room air placed on 02 by fire, sats returned to normal. Pt did not want to come to hospital, facility stated she has to go. Past Medical History  Diagnosis Date  . Subarachnoid hemorrhage (HCC)   . Asthma   . Hypertension    Past Surgical History  Procedure Laterality Date  . Lung surgery    . Abdominal hysterectomy     Family History  Problem Relation Age of Onset  . Hypertension Mother   . Hypertension Father    Social History  Substance Use Topics  . Smoking status: Former Smoker    Types: Cigarettes  . Smokeless tobacco: Never Used  . Alcohol Use: No   OB History    No data available     Review of Systems  Unable to perform ROS: Dementia      Allergies  Review of patient's allergies indicates no known allergies.  Home Medications   Prior to Admission medications   Medication Sig Start Date End Date Taking? Authorizing Provider  albuterol (PROVENTIL HFA;VENTOLIN HFA) 108 (90 BASE) MCG/ACT inhaler Inhale 2 puffs into the lungs every 6 (six) hours as needed for wheezing or shortness of breath.   Yes Historical Provider, MD  cholecalciferol (VITAMIN D) 1000 UNITS tablet Take 1,000 Units by mouth daily.   Yes Historical Provider, MD  diltiazem (CARDIZEM) 30 MG tablet Take 1 tablet (30 mg total) by mouth every 8 (eight) hours. 07/02/15  Yes Costin Otelia Sergeant, MD  diphenhydrAMINE (BENADRYL) 25 mg capsule Take 1 capsule (25 mg total) by mouth every 8 (eight) hours as needed for itching. 01/28/14  Yes Susy Frizzle, MD  famotidine (PEPCID) 20 MG tablet Take 1 tablet (20 mg total) by mouth daily. 01/28/14   Yes Susy Frizzle, MD  ferrous sulfate 325 (65 FE) MG tablet Take 325 mg by mouth daily with breakfast.   Yes Historical Provider, MD  Fluticasone-Salmeterol (ADVAIR) 250-50 MCG/DOSE AEPB Inhale 1 puff into the lungs 2 (two) times daily.   Yes Historical Provider, MD  furosemide (LASIX) 20 MG tablet Take 1 tablet (20 mg total) by mouth daily. 11/14/15  Yes Sharee Holster, NP  loratadine (CLARITIN) 10 MG tablet Take 10 mg by mouth daily.   Yes Historical Provider, MD  mirtazapine (REMERON) 7.5 MG tablet Take 7.5 mg by mouth at bedtime. 30 day trial for appetite stimulation. Scheduled to end after 11/27/15.   Yes Historical Provider, MD  Nutritional Supplements (TWOCAL HN) LIQD Take 237 mLs by mouth 3 (three) times daily.   Yes Historical Provider, MD  ondansetron (ZOFRAN) 4 MG tablet Take 4 mg by mouth every 4 (four) hours as needed for nausea or vomiting.   Yes Historical Provider, MD   BP 126/71 mmHg  Pulse 97  Temp(Src) 97.3 F (36.3 C) (Oral)  Resp 22  SpO2 96% Physical Exam  Constitutional: She appears well-developed and well-nourished. No distress.  HENT:  Head: Normocephalic and atraumatic.  Eyes: Pupils are equal, round, and reactive to light.  Neck: Normal range of motion.  Cardiovascular:  Normal rate and intact distal pulses.   Pulmonary/Chest: Effort normal. No respiratory distress. She has no wheezes.  Abdominal: Normal appearance. She exhibits no distension. There is no tenderness. There is no rebound.  Musculoskeletal: Normal range of motion.  Neurological: She is alert. No cranial nerve deficit.  Skin: Skin is warm and dry. No rash noted.  Psychiatric: She has a normal mood and affect. Her behavior is normal.  Nursing note and vitals reviewed.   ED Course  Procedures (including critical care time) Labs Review Labs Reviewed - No data to display  Imaging Review Results for orders placed or performed during the hospital encounter of 06/26/15  Culture, blood (routine x  2)  Result Value Ref Range   Specimen Description BLOOD BLH    Special Requests IN PEDIATRIC BOTTLE 3 CC    Culture      NO GROWTH 5 DAYS Performed at [facility]    Report Status 07/01/2015 FINAL   Culture, blood (routine x 2)  Result Value Ref Range   Specimen Description BLOOD LEFT WRIST    Special Requests IN PEDIATRIC BOTTLE 2 CC    Culture      NO GROWTH 5 DAYS Performed at [facility]    Report Status 07/01/2015 FINAL   MRSA PCR Screening  Result Value Ref Range   MRSA by PCR NEGATIVE NEGATIVE  Urinalysis, Routine w reflex microscopic (not at [facility]  Result Value Ref Range   Color, Urine YELLOW YELLOW   APPearance CLOUDY (A) CLEAR   Specific Gravity, Urine 1.018 1.005 - 1.030   pH 5.0 5.0 - 8.0   Glucose, UA NEGATIVE NEGATIVE mg/dL   Hgb urine dipstick NEGATIVE NEGATIVE   Bilirubin Urine SMALL (A) NEGATIVE   Ketones, ur NEGATIVE NEGATIVE mg/dL   Protein, ur 098 (A) NEGATIVE mg/dL   Urobilinogen, UA 0.2 0.0 - 1.0 mg/dL   Nitrite NEGATIVE NEGATIVE   Leukocytes, UA NEGATIVE NEGATIVE  Comprehensive metabolic panel  Result Value Ref Range   Sodium 142 135 - 145 mmol/L   Potassium 4.6 3.5 - 5.1 mmol/L   Chloride 96 (L) 101 - 111 mmol/L   CO2 37 (H) 22 - 32 mmol/L   Glucose, Bld 133 (H) 65 - 99 mg/dL   BUN 34 (H) 6 - 20 mg/dL   Creatinine, Ser 1.19 (H) 0.44 - 1.00 mg/dL   Calcium 9.3 8.9 - 14.7 mg/dL   Total Protein 7.3 6.5 - 8.1 g/dL   Albumin 3.9 3.5 - 5.0 g/dL   AST 54 (H) 15 - 41 U/L   ALT 28 14 - 54 U/L   Alkaline Phosphatase 70 38 - 126 U/L   Total Bilirubin 0.4 0.3 - 1.2 mg/dL   GFR calc non Af Amer 19 (L) >60 mL/min   GFR calc Af Amer 22 (L) >60 mL/min   Anion gap 9 5 - 15  Lipase, blood  Result Value Ref Range   Lipase 24 22 - 51 U/L  Troponin I  Result Value Ref Range   Troponin I 0.03 <0.031 ng/mL  Lactic acid, plasma  Result Value Ref Range   Lactic Acid, Venous 1.7 0.5 - 2.0 mmol/L  Lactic acid, plasma  Result Value Ref Range    Lactic Acid, Venous 1.6 0.5 - 2.0 mmol/L  CBC with Differential  Result Value Ref Range   WBC 9.2 4.0 - 10.5 K/uL   RBC 3.65 (L) 3.87 - 5.11 MIL/uL   Hemoglobin 11.2 (L) 12.0 - 15.0 g/dL  HCT 35.9 (L) 36.0 - 46.0 %   MCV 98.4 78.0 - 100.0 fL   MCH 30.7 26.0 - 34.0 pg   MCHC 31.2 30.0 - 36.0 g/dL   RDW 16.1 09.6 - 04.5 %   Platelets 151 150 - 400 K/uL   Neutrophils Relative % 87 (H) 43 - 77 %   Neutro Abs 8.0 (H) 1.7 - 7.7 K/uL   Lymphocytes Relative 8 (L) 12 - 46 %   Lymphs Abs 0.8 0.7 - 4.0 K/uL   Monocytes Relative 5 3 - 12 %   Monocytes Absolute 0.4 0.1 - 1.0 K/uL   Eosinophils Relative 0 0 - 5 %   Eosinophils Absolute 0.0 0.0 - 0.7 K/uL   Basophils Relative 0 0 - 1 %   Basophils Absolute 0.0 0.0 - 0.1 K/uL  Urine microscopic-add on  Result Value Ref Range   Squamous Epithelial / LPF RARE RARE   WBC, UA 3-6 <3 WBC/hpf   Bacteria, UA MANY (A) RARE   Casts HYALINE CASTS (A) NEGATIVE  Blood gas, arterial  Result Value Ref Range   O2 Content 2.0 L/min   Delivery systems NASAL CANNULA    pH, Arterial 7.201 (L) 7.350 - 7.450   pCO2 arterial 111 (HH) 35.0 - 45.0 mmHg   pO2, Arterial 78.9 (L) 80.0 - 100.0 mmHg   Bicarbonate 41.8 (H) 20.0 - 24.0 mEq/L   TCO2 40.4 0 - 100 mmol/L   Acid-Base Excess 10.8 (H) 0.0 - 2.0 mmol/L   O2 Saturation 93.9 %   Patient temperature 98.6    Collection site BRACHIAL ARTERY    Drawn by [id number]    Sample type ARTERIAL   Brain natriuretic peptide  Result Value Ref Range   B Natriuretic Peptide 114.7 (H) 0.0 - 100.0 pg/mL  CBC  Result Value Ref Range   WBC 6.8 4.0 - 10.5 K/uL   RBC 3.66 (L) 3.87 - 5.11 MIL/uL   Hemoglobin 11.4 (L) 12.0 - 15.0 g/dL   HCT 91.4 78.2 - 95.6 %   MCV 98.4 78.0 - 100.0 fL   MCH 31.1 26.0 - 34.0 pg   MCHC 31.7 30.0 - 36.0 g/dL   RDW 21.3 08.6 - 57.8 %   Platelets 146 (L) 150 - 400 K/uL  Creatinine, serum  Result Value Ref Range   Creatinine, Ser 2.11 (H) 0.44 - 1.00 mg/dL   GFR calc non Af Amer 20 (L) >60  mL/min   GFR calc Af Amer 23 (L) >60 mL/min  Magnesium  Result Value Ref Range   Magnesium 2.2 1.7 - 2.4 mg/dL  Hepatic function panel  Result Value Ref Range   Total Protein 7.6 6.5 - 8.1 g/dL   Albumin 4.1 3.5 - 5.0 g/dL   AST 44 (H) 15 - 41 U/L   ALT 28 14 - 54 U/L   Alkaline Phosphatase 76 38 - 126 U/L   Total Bilirubin 0.4 0.3 - 1.2 mg/dL   Bilirubin, Direct <4.6 (L) 0.1 - 0.5 mg/dL   Indirect Bilirubin NOT CALCULATED 0.3 - 0.9 mg/dL  TSH  Result Value Ref Range   TSH 0.626 0.350 - 4.500 uIU/mL  Troponin I  Result Value Ref Range   Troponin I 0.10 (H) <0.031 ng/mL  Troponin I  Result Value Ref Range   Troponin I 0.10 (H) <0.031 ng/mL  Troponin I  Result Value Ref Range   Troponin I 0.09 (H) <0.031 ng/mL  Hemoglobin A1c  Result Value Ref Range   Hgb  A1c MFr Bld 4.9 4.8 - 5.6 %   Mean Plasma Glucose 94 mg/dL  Blood gas, arterial  Result Value Ref Range   FIO2 0.40    Delivery systems BILEVEL POSITIVE AIRWAY PRESSURE    Mode BILEVEL POSITIVE AIRWAY PRESSURE    LHR 12 resp/min   Inspiratory PAP 16    Expiratory PAP 8    pH, Arterial 7.176 (LL) 7.350 - 7.450   pCO2 arterial ABOVE REPORTABLE RANGE. 35.0 - 45.0 mmHg   pO2, Arterial 76.2 (L) 80.0 - 100.0 mmHg   O2 Saturation 93.5 %   Patient temperature 98.6    Collection site LEFT RADIAL    Drawn by [id number]    Sample type ARTERIAL DRAW    Allens test (pass/fail) PASS PASS  CBC  Result Value Ref Range   WBC 5.1 4.0 - 10.5 K/uL   RBC 3.47 (L) 3.87 - 5.11 MIL/uL   Hemoglobin 10.4 (L) 12.0 - 15.0 g/dL   HCT 56.433.7 (L) 33.236.0 - 95.146.0 %   MCV 97.1 78.0 - 100.0 fL   MCH 30.0 26.0 - 34.0 pg   MCHC 30.9 30.0 - 36.0 g/dL   RDW 88.413.8 16.611.5 - 06.315.5 %   Platelets 133 (L) 150 - 400 K/uL  Comprehensive metabolic panel  Result Value Ref Range   Sodium 143 135 - 145 mmol/L   Potassium 5.1 3.5 - 5.1 mmol/L   Chloride 99 (L) 101 - 111 mmol/L   CO2 41 (H) 22 - 32 mmol/L   Glucose, Bld 113 (H) 65 - 99 mg/dL   BUN 35 (H) 6 - 20 mg/dL    Creatinine, Ser 0.162.07 (H) 0.44 - 1.00 mg/dL   Calcium 9.1 8.9 - 01.010.3 mg/dL   Total Protein 6.7 6.5 - 8.1 g/dL   Albumin 3.5 3.5 - 5.0 g/dL   AST 28 15 - 41 U/L   ALT 23 14 - 54 U/L   Alkaline Phosphatase 62 38 - 126 U/L   Total Bilirubin 0.8 0.3 - 1.2 mg/dL   GFR calc non Af Amer 20 (L) >60 mL/min   GFR calc Af Amer 24 (L) >60 mL/min   Anion gap 3 (L) 5 - 15  Glucose, capillary  Result Value Ref Range   Glucose-Capillary 77 65 - 99 mg/dL  Glucose, capillary  Result Value Ref Range   Glucose-Capillary 107 (H) 65 - 99 mg/dL  Glucose, capillary  Result Value Ref Range   Glucose-Capillary 99 65 - 99 mg/dL  Blood gas, arterial  Result Value Ref Range   FIO2 0.40    Delivery systems BILEVEL POSITIVE AIRWAY PRESSURE    Mode BILEVEL POSITIVE AIRWAY PRESSURE    Inspiratory PAP 18    Expiratory PAP 6.0    pH, Arterial 7.384 7.350 - 7.450   pCO2 arterial 62.8 (HH) 35.0 - 45.0 mmHg   pO2, Arterial 95.1 80.0 - 100.0 mmHg   Bicarbonate 36.7 (H) 20.0 - 24.0 mEq/L   TCO2 34.2 0 - 100 mmol/L   Acid-Base Excess 10.3 (H) 0.0 - 2.0 mmol/L   O2 Saturation 97.6 %   Patient temperature 98.6    Collection site RIGHT RADIAL    Drawn by COLLECTED BY RT    Sample type ARTERIAL DRAW    Allens test (pass/fail) PASS PASS  Glucose, capillary  Result Value Ref Range   Glucose-Capillary 95 65 - 99 mg/dL  Glucose, capillary  Result Value Ref Range   Glucose-Capillary 73 65 - 99 mg/dL  CBC  Result  Value Ref Range   WBC 12.1 (H) 4.0 - 10.5 K/uL   RBC 3.82 (L) 3.87 - 5.11 MIL/uL   Hemoglobin 11.4 (L) 12.0 - 15.0 g/dL   HCT 29.5 (L) 28.4 - 13.2 %   MCV 92.9 78.0 - 100.0 fL   MCH 29.8 26.0 - 34.0 pg   MCHC 32.1 30.0 - 36.0 g/dL   RDW 44.0 10.2 - 72.5 %   Platelets 166 150 - 400 K/uL  Comprehensive metabolic panel  Result Value Ref Range   Sodium 142 135 - 145 mmol/L   Potassium 4.6 3.5 - 5.1 mmol/L   Chloride 101 101 - 111 mmol/L   CO2 35 (H) 22 - 32 mmol/L   Glucose, Bld 129 (H) 65 - 99  mg/dL   BUN 44 (H) 6 - 20 mg/dL   Creatinine, Ser 3.66 (H) 0.44 - 1.00 mg/dL   Calcium 9.5 8.9 - 44.0 mg/dL   Total Protein 7.1 6.5 - 8.1 g/dL   Albumin 3.5 3.5 - 5.0 g/dL   AST 22 15 - 41 U/L   ALT 18 14 - 54 U/L   Alkaline Phosphatase 63 38 - 126 U/L   Total Bilirubin 0.5 0.3 - 1.2 mg/dL   GFR calc non Af Amer 22 (L) >60 mL/min   GFR calc Af Amer 26 (L) >60 mL/min   Anion gap 6 5 - 15  Glucose, capillary  Result Value Ref Range   Glucose-Capillary 183 (H) 65 - 99 mg/dL  Glucose, capillary  Result Value Ref Range   Glucose-Capillary 167 (H) 65 - 99 mg/dL   Comment 1 Notify RN   Glucose, capillary  Result Value Ref Range   Glucose-Capillary 196 (H) 65 - 99 mg/dL  Glucose, capillary  Result Value Ref Range   Glucose-Capillary 130 (H) 65 - 99 mg/dL   Comment 1 Notify RN   Glucose, capillary  Result Value Ref Range   Glucose-Capillary 97 65 - 99 mg/dL  Glucose, capillary  Result Value Ref Range   Glucose-Capillary 118 (H) 65 - 99 mg/dL  Glucose, capillary  Result Value Ref Range   Glucose-Capillary 123 (H) 65 - 99 mg/dL  Blood gas, arterial  Result Value Ref Range   O2 Content 2.0 L/min   Delivery systems NASAL CANNULA    pH, Arterial 7.489 (H) 7.350 - 7.450   pCO2 arterial 45.3 (H) 35.0 - 45.0 mmHg   pO2, Arterial 70.7 (L) 80.0 - 100.0 mmHg   Bicarbonate 34.1 (H) 20.0 - 24.0 mEq/L   TCO2 30.6 0 - 100 mmol/L   Acid-Base Excess 9.8 (H) 0.0 - 2.0 mmol/L   O2 Saturation 95.2 %   Patient temperature 98.6    Collection site LEFT RADIAL    Drawn by [id number]    Sample type ARTERIAL DRAW    Allens test (pass/fail) PASS PASS  Glucose, capillary  Result Value Ref Range   Glucose-Capillary 191 (H) 65 - 99 mg/dL  Glucose, capillary  Result Value Ref Range   Glucose-Capillary 156 (H) 65 - 99 mg/dL  Glucose, capillary  Result Value Ref Range   Glucose-Capillary 87 65 - 99 mg/dL  Glucose, capillary  Result Value Ref Range   Glucose-Capillary 169 (H) 65 - 99 mg/dL  Glucose,  capillary  Result Value Ref Range   Glucose-Capillary 128 (H) 65 - 99 mg/dL  Comprehensive metabolic panel  Result Value Ref Range   Sodium 145 135 - 145 mmol/L   Potassium 4.2 3.5 - 5.1 mmol/L  Chloride 106 101 - 111 mmol/L   CO2 33 (H) 22 - 32 mmol/L   Glucose, Bld 109 (H) 65 - 99 mg/dL   BUN 34 (H) 6 - 20 mg/dL   Creatinine, Ser 1.61 (H) 0.44 - 1.00 mg/dL   Calcium 9.8 8.9 - 09.6 mg/dL   Total Protein 6.9 6.5 - 8.1 g/dL   Albumin 3.6 3.5 - 5.0 g/dL   AST 20 15 - 41 U/L   ALT 15 14 - 54 U/L   Alkaline Phosphatase 62 38 - 126 U/L   Total Bilirubin 0.7 0.3 - 1.2 mg/dL   GFR calc non Af Amer 27 (L) >60 mL/min   GFR calc Af Amer 31 (L) >60 mL/min   Anion gap 6 5 - 15  Magnesium  Result Value Ref Range   Magnesium 2.3 1.7 - 2.4 mg/dL  Glucose, capillary  Result Value Ref Range   Glucose-Capillary 97 65 - 99 mg/dL   Comment 1 Notify RN    Comment 2 Document in Chart   Glucose, capillary  Result Value Ref Range   Glucose-Capillary 164 (H) 65 - 99 mg/dL  Glucose, capillary  Result Value Ref Range   Glucose-Capillary 106 (H) 65 - 99 mg/dL  Comprehensive metabolic panel  Result Value Ref Range   Sodium 146 (H) 135 - 145 mmol/L   Potassium 4.2 3.5 - 5.1 mmol/L   Chloride 106 101 - 111 mmol/L   CO2 34 (H) 22 - 32 mmol/L   Glucose, Bld 137 (H) 65 - 99 mg/dL   BUN 42 (H) 6 - 20 mg/dL   Creatinine, Ser 0.45 (H) 0.44 - 1.00 mg/dL   Calcium 9.4 8.9 - 40.9 mg/dL   Total Protein 5.5 (L) 6.5 - 8.1 g/dL   Albumin 2.8 (L) 3.5 - 5.0 g/dL   AST 14 (L) 15 - 41 U/L   ALT 12 (L) 14 - 54 U/L   Alkaline Phosphatase 47 38 - 126 U/L   Total Bilirubin 0.6 0.3 - 1.2 mg/dL   GFR calc non Af Amer 25 (L) >60 mL/min   GFR calc Af Amer 29 (L) >60 mL/min   Anion gap 6 5 - 15  CBC  Result Value Ref Range   WBC 9.0 4.0 - 10.5 K/uL   RBC 3.54 (L) 3.87 - 5.11 MIL/uL   Hemoglobin 10.9 (L) 12.0 - 15.0 g/dL   HCT 81.1 (L) 91.4 - 78.2 %   MCV 93.2 78.0 - 100.0 fL   MCH 30.8 26.0 - 34.0 pg   MCHC  33.0 30.0 - 36.0 g/dL   RDW 95.6 21.3 - 08.6 %   Platelets 166 150 - 400 K/uL  Glucose, capillary  Result Value Ref Range   Glucose-Capillary 171 (H) 65 - 99 mg/dL   Comment 1 Notify RN    Comment 2 Document in Chart   Glucose, capillary  Result Value Ref Range   Glucose-Capillary 174 (H) 65 - 99 mg/dL  Glucose, capillary  Result Value Ref Range   Glucose-Capillary 123 (H) 65 - 99 mg/dL   Comment 1 Notify RN    Comment 2 Document in Chart   Glucose, capillary  Result Value Ref Range   Glucose-Capillary 144 (H) 65 - 99 mg/dL   Comment 1 Notify RN    Comment 2 Document in Chart   Glucose, capillary  Result Value Ref Range   Glucose-Capillary 202 (H) 65 - 99 mg/dL   Comment 1 Notify RN    Comment 2  Document in Chart   Glucose, capillary  Result Value Ref Range   Glucose-Capillary 171 (H) 65 - 99 mg/dL   Comment 1 Notify RN    Comment 2 Document in Chart   CBC  Result Value Ref Range   WBC 12.0 (H) 4.0 - 10.5 K/uL   RBC 3.78 (L) 3.87 - 5.11 MIL/uL   Hemoglobin 10.9 (L) 12.0 - 15.0 g/dL   HCT 45.4 (L) 09.8 - 11.9 %   MCV 94.7 78.0 - 100.0 fL   MCH 28.8 26.0 - 34.0 pg   MCHC 30.4 30.0 - 36.0 g/dL   RDW 14.7 82.9 - 56.2 %   Platelets 156 150 - 400 K/uL  Comprehensive metabolic panel  Result Value Ref Range   Sodium 142 135 - 145 mmol/L   Potassium 4.1 3.5 - 5.1 mmol/L   Chloride 104 101 - 111 mmol/L   CO2 32 22 - 32 mmol/L   Glucose, Bld 144 (H) 65 - 99 mg/dL   BUN 50 (H) 6 - 20 mg/dL   Creatinine, Ser 1.30 (H) 0.44 - 1.00 mg/dL   Calcium 9.6 8.9 - 86.5 mg/dL   Total Protein 5.6 (L) 6.5 - 8.1 g/dL   Albumin 2.9 (L) 3.5 - 5.0 g/dL   AST 11 (L) 15 - 41 U/L   ALT 13 (L) 14 - 54 U/L   Alkaline Phosphatase 50 38 - 126 U/L   Total Bilirubin 0.6 0.3 - 1.2 mg/dL   GFR calc non Af Amer 22 (L) >60 mL/min   GFR calc Af Amer 26 (L) >60 mL/min   Anion gap 6 5 - 15  Glucose, capillary  Result Value Ref Range   Glucose-Capillary 193 (H) 65 - 99 mg/dL  Glucose, capillary   Result Value Ref Range   Glucose-Capillary 178 (H) 65 - 99 mg/dL  Glucose, capillary  Result Value Ref Range   Glucose-Capillary 141 (H) 65 - 99 mg/dL  Glucose, capillary  Result Value Ref Range   Glucose-Capillary 126 (H) 65 - 99 mg/dL  Glucose, capillary  Result Value Ref Range   Glucose-Capillary 125 (H) 65 - 99 mg/dL  Basic metabolic panel  Result Value Ref Range   Sodium 142 135 - 145 mmol/L   Potassium 5.0 3.5 - 5.1 mmol/L   Chloride 106 101 - 111 mmol/L   CO2 30 22 - 32 mmol/L   Glucose, Bld 97 65 - 99 mg/dL   BUN 50 (H) 6 - 20 mg/dL   Creatinine, Ser 7.84 (H) 0.44 - 1.00 mg/dL   Calcium 9.5 8.9 - 69.6 mg/dL   GFR calc non Af Amer 24 (L) >60 mL/min   GFR calc Af Amer 28 (L) >60 mL/min   Anion gap 6 5 - 15  Glucose, capillary  Result Value Ref Range   Glucose-Capillary 99 65 - 99 mg/dL  Glucose, capillary  Result Value Ref Range   Glucose-Capillary 139 (H) 65 - 99 mg/dL  Glucose, capillary  Result Value Ref Range   Glucose-Capillary 92 65 - 99 mg/dL  Glucose, capillary  Result Value Ref Range   Glucose-Capillary 99 65 - 99 mg/dL  Glucose, capillary  Result Value Ref Range   Glucose-Capillary 84 65 - 99 mg/dL  Glucose, capillary  Result Value Ref Range   Glucose-Capillary 113 (H) 65 - 99 mg/dL  Glucose, capillary  Result Value Ref Range   Glucose-Capillary 142 (H) 65 - 99 mg/dL  Glucose, capillary  Result Value Ref Range   Glucose-Capillary 148 (  H) 65 - 99 mg/dL  Glucose, capillary  Result Value Ref Range   Glucose-Capillary 97 65 - 99 mg/dL  Glucose, capillary  Result Value Ref Range   Glucose-Capillary 84 65 - 99 mg/dL  Glucose, capillary  Result Value Ref Range   Glucose-Capillary 82 65 - 99 mg/dL  Glucose, capillary  Result Value Ref Range   Glucose-Capillary 118 (H) 65 - 99 mg/dL   Dg Chest Portable 1 View  11/15/2015  CLINICAL DATA:  Shortness of breath, history of asthma, former smoker. EXAM: PORTABLE CHEST 1 VIEW COMPARISON:  06/26/2015  FINDINGS: The cardiac silhouette is stably enlarged. Mediastinal contours appear intact. The aorta is torturous and contains atherosclerotic calcifications. There is no evidence of focal airspace consolidation, pleural effusion or pneumothorax. There is chronic coarsening of the interstitial markings and areas of linear opacities in the mid lungs likely representing atelectasis or scarring. Surgical line is seen in the mid right lung with chronic prominence of the right hilum. Osseous structures are without acute abnormality. Soft tissues are grossly normal. IMPRESSION: Stable cardiomegaly and pulmonary vascular congestion. Stable postsurgical changes and areas of scarring within the lungs. No evidence of acute focal airspace consolidation or frank pulmonary edema. Electronically Signed   By: Ted Mcalpine M.D.   On: 11/15/2015 10:56        MDM   Final diagnoses:  Shortness of breath        Nelva Nay, MD 11/25/15 2201

## 2015-11-15 NOTE — ED Notes (Signed)
Called [facility] for transport.  

## 2015-11-15 NOTE — ED Notes (Signed)
Pt resides at [facility] [address], [place]. Pt is on continuous 02, pt without 02, "plug me back to my oxygen" they did not, instead called 911. sats 84% on room air placed on 02 by fire, sats returned to normal. Pt did not want to come to hospital, facility stated she has to go.

## 2015-11-15 NOTE — ED Notes (Signed)
Bed: WA02 Expected date:  Expected time:  Means of arrival:  Comments: Ems- abd pain 

## 2015-11-15 NOTE — Discharge Instructions (Signed)

## 2015-11-23 ENCOUNTER — Non-Acute Institutional Stay (SKILLED_NURSING_FACILITY): Payer: Medicare HMO | Admitting: Internal Medicine

## 2015-11-23 DIAGNOSIS — K219 Gastro-esophageal reflux disease without esophagitis: Secondary | ICD-10-CM | POA: Diagnosis not present

## 2015-11-23 DIAGNOSIS — J449 Chronic obstructive pulmonary disease, unspecified: Secondary | ICD-10-CM

## 2015-11-23 DIAGNOSIS — J9612 Chronic respiratory failure with hypercapnia: Secondary | ICD-10-CM

## 2015-11-23 NOTE — Progress Notes (Signed)
MRN: [id number] Name: Andrea Weber  Sex: female Age: 82 y.o. DOB: 24-Apr-1926  [facility] Facility[facility]: Level Of Care: SNF Provider: Merrilee SeashoreALEXANDER, Katsumi Wisler D Emergency Contacts: Extended Emergency Contact Information Primary Emergency Contact: Mattocks,Clarence Address: [address]          [place], [place] [id number] [place] Home Phone: [phone] Work Phone: [phone] Relation: Other Secondary Emergency Contact: Hetty ElyMattocks,Martha  [place] Mobile Phone: [phone] Relation: Other  Code Status:   Allergies: Review of patient's allergies indicates no known allergies.  Chief Complaint  Patient presents with  . Medical Management of Chronic Issues    HPI: Patient is 82 y.o. female with hx SAH, HTN, COPD, GERD, CKD3, anemia who is being seen today for routine issues of chronic respiratory failure, COPD and GERD.  Past Medical History  Diagnosis Date  . Subarachnoid hemorrhage (HCC)   . Asthma   . Hypertension     Past Surgical History  Procedure Laterality Date  . Lung surgery    . Abdominal hysterectomy        Medication List       This list is accurate as of: 11/23/15 11:59 PM.  Always use your most recent med list.               albuterol 108 (90 Base) MCG/ACT inhaler  Commonly known as:  PROVENTIL HFA;VENTOLIN HFA  Inhale 2 puffs into the lungs every 6 (six) hours as needed for wheezing or shortness of breath.     cholecalciferol 1000 units tablet  Commonly known as:  VITAMIN D  Take 1,000 Units by mouth daily.     diltiazem 30 MG tablet  Commonly known as:  CARDIZEM  Take 1 tablet (30 mg total) by mouth every 8 (eight) hours.     diphenhydrAMINE 25 mg capsule  Commonly known as:  BENADRYL  Take 1 capsule (25 mg total) by mouth every 8 (eight) hours as needed for itching.     famotidine 20 MG tablet  Commonly known as:  PEPCID  Take 1 tablet (20 mg total) by mouth daily.     ferrous sulfate 325 (65  FE) MG tablet  Take 325 mg by mouth daily with breakfast.     Fluticasone-Salmeterol 250-50 MCG/DOSE Aepb  Commonly known as:  ADVAIR  Inhale 1 puff into the lungs 2 (two) times daily.     furosemide 20 MG tablet  Commonly known as:  LASIX  Take 1 tablet (20 mg total) by mouth daily.     loratadine 10 MG tablet  Commonly known as:  CLARITIN  Take 10 mg by mouth daily.     mirtazapine 7.5 MG tablet  Commonly known as:  REMERON  Take 7.5 mg by mouth at bedtime. 30 day trial for appetite stimulation. Scheduled to end after 11/27/15.     ondansetron 4 MG tablet  Commonly known as:  ZOFRAN  Take 4 mg by mouth every 4 (four) hours as needed for nausea or vomiting.     TWOCAL HN Liqd  Take 237 mLs by mouth 3 (three) times daily.        No orders of the defined types were placed in this encounter.     There is no immunization history on file for this patient.  Social History  Substance Use Topics  . Smoking status: Former Smoker    Types: Cigarettes  . Smokeless tobacco: Never Used  . Alcohol Use: No    Review  of Systems  DATA OBTAINED: from patient, nurse GENERAL:  no fevers, fatigue, appetite changes SKIN: No itching, rash HEENT: No complaint RESPIRATORY: No cough, wheezing, SOB CARDIAC: No chest pain, palpitations, lower extremity edema  GI: No abdominal pain, No N/V/D or constipation, No heartburn or reflux  GU: No dysuria, frequency or urgency, or incontinence  MUSCULOSKELETAL: No unrelieved bone/joint pain NEUROLOGIC: No headache, dizziness  PSYCHIATRIC: No overt anxiety or sadness  Filed Vitals:   11/28/15 1512  BP: 118/71  Pulse: 96  Temp: 96.6 F (35.9 C)  Resp: 20    Physical Exam  GENERAL APPEARANCE: Alert, conversant, delightful thin BFNo acute distress  SKIN: No diaphoresis rash, or wounds HEENT: Unremarkable RESPIRATORY: Breathing is even, unlabored. Lung sounds are clear   CARDIOVASCULAR: Heart RRR no murmurs, rubs or gallops. No  peripheral edema  GASTROINTESTINAL: Abdomen is soft, non-tender, not distended w/ normal bowel sounds.  GENITOURINARY: Bladder non tender, not distended  MUSCULOSKELETAL: No abnormal joints or musculature NEUROLOGIC: Cranial nerves 2-12 grossly intact. Moves all extremities PSYCHIATRIC: Mood and affect appropriate to situation, no behavioral issues  Patient Active Problem List   Diagnosis Date Noted  . Loss of weight 11/14/2015  . Bilateral lower extremity edema 09/10/2015  . Chronic respiratory failure with hypercapnia (HCC) 09/03/2015  . GERD without esophagitis 09/03/2015  . Allergic rhinitis due to allergen 09/03/2015  . Anemia of chronic disease 09/03/2015  . COPD (chronic obstructive pulmonary disease) (HCC) 07/07/2015  . CKD (chronic kidney disease) stage 3, GFR 30-59 ml/min 07/07/2015  . Multifocal atrial tachycardia (HCC) 07/07/2015  . Acute encephalopathy 07/01/2015  . AKI (acute kidney injury) (HCC) 07/01/2015  . Altered mental status 06/26/2015  . Acute respiratory failure with hypercapnia (HCC) 06/26/2015    CBC    Component Value Date/Time   WBC 12.0* 07/01/2015 0600   RBC 3.78* 07/01/2015 0600   HGB 10.9* 07/01/2015 0600   HCT 35.8* 07/01/2015 0600   PLT 156 07/01/2015 0600   MCV 94.7 07/01/2015 0600   LYMPHSABS 0.8 06/26/2015 1035   MONOABS 0.4 06/26/2015 1035   EOSABS 0.0 06/26/2015 1035   BASOSABS 0.0 06/26/2015 1035    CMP     Component Value Date/Time   NA 142 07/02/2015 0455   K 5.0 07/02/2015 0455   CL 106 07/02/2015 0455   CO2 30 07/02/2015 0455   GLUCOSE 97 07/02/2015 0455   BUN 50* 07/02/2015 0455   CREATININE 1.79* 07/02/2015 0455   CALCIUM 9.5 07/02/2015 0455   PROT 5.6* 07/01/2015 0600   ALBUMIN 2.9* 07/01/2015 0600   AST 11* 07/01/2015 0600   ALT 13* 07/01/2015 0600   ALKPHOS 50 07/01/2015 0600   BILITOT 0.6 07/01/2015 0600   GFRNONAA 24* 07/02/2015 0455   GFRAA 28* 07/02/2015 0455    Assessment and Plan  COPD (chronic  obstructive pulmonary disease) is stable will continue albuterol 2 puffs every 6 hours as needed; advair 250/50 twice daily will monitor  Chronic respiratory failure with hypercapnia (HCC) Controlled with bipap nightly; will monitor   GERD without esophagitis Chronic and stable; continue pepcid 20 mg daily     Margit Hanks, MD

## 2015-11-28 ENCOUNTER — Encounter: Payer: Self-pay | Admitting: Internal Medicine

## 2015-11-28 NOTE — Assessment & Plan Note (Signed)
Controlled with bipap nightly; will monitor

## 2015-11-28 NOTE — Assessment & Plan Note (Signed)
is stable will continue albuterol 2 puffs every 6 hours as needed; advair 250/50 twice daily will monitor

## 2015-11-28 NOTE — Assessment & Plan Note (Signed)
Chronic and stable; continue pepcid 20 mg daily

## 2015-12-21 ENCOUNTER — Encounter: Payer: Self-pay | Admitting: Internal Medicine

## 2015-12-21 ENCOUNTER — Non-Acute Institutional Stay (SKILLED_NURSING_FACILITY): Payer: Medicare HMO | Admitting: Internal Medicine

## 2015-12-21 DIAGNOSIS — I471 Supraventricular tachycardia: Secondary | ICD-10-CM | POA: Diagnosis not present

## 2015-12-21 DIAGNOSIS — J309 Allergic rhinitis, unspecified: Secondary | ICD-10-CM | POA: Diagnosis not present

## 2015-12-21 DIAGNOSIS — D638 Anemia in other chronic diseases classified elsewhere: Secondary | ICD-10-CM | POA: Diagnosis not present

## 2015-12-21 NOTE — Progress Notes (Signed)
MRN: [id number] Name: Andrea Weber  Sex: female Age: 82 y.o. DOB: 13-Oct-1926  [facility] #: [facility] Facility/Room:214 Level Of Care: SNF Provider: Merrilee Seashore D Emergency Contacts: Extended Emergency Contact Information Primary Emergency Contact: Mattocks,Clarence Address: [address]          [place], [place] [address] [place] Home Phone: [phone] Work Phone: [phone] Relation: Other Secondary Emergency Contact: Hetty Ely [place] Mobile Phone: [phone] Relation: Other  Code Status:   Allergies: Review of patient's allergies indicates no known allergies.  Chief Complaint  Patient presents with  . Medical Management of Chronic Issues    HPI: Patient is 82 y.o. female who is being seen for routine isues of MAT, anemia, and allergic rhinitis.  Past Medical History  Diagnosis Date  . Subarachnoid hemorrhage (HCC)   . Asthma   . Hypertension     Past Surgical History  Procedure Laterality Date  . Lung surgery    . Abdominal hysterectomy        Medication List       This list is accurate as of: 12/21/15 11:59 PM.  Always use your most recent med list.               albuterol 108 (90 Base) MCG/ACT inhaler  Commonly known as:  PROVENTIL HFA;VENTOLIN HFA  Inhale 2 puffs into the lungs every 6 (six) hours as needed for wheezing or shortness of breath.     cholecalciferol 1000 units tablet  Commonly known as:  VITAMIN D  Take 1,000 Units by mouth daily.     diltiazem 30 MG tablet  Commonly known as:  CARDIZEM  Take 1 tablet (30 mg total) by mouth every 8 (eight) hours.     diphenhydrAMINE 25 mg capsule  Commonly known as:  BENADRYL  Take 1 capsule (25 mg total) by mouth every 8 (eight) hours as needed for itching.     famotidine 20 MG tablet  Commonly known as:  PEPCID  Take 1 tablet (20 mg total) by mouth daily.     ferrous sulfate 325 (65 FE) MG tablet  Take 325 mg by mouth daily with  breakfast.     Fluticasone-Salmeterol 250-50 MCG/DOSE Aepb  Commonly known as:  ADVAIR  Inhale 1 puff into the lungs 2 (two) times daily.     furosemide 20 MG tablet  Commonly known as:  LASIX  Take 1 tablet (20 mg total) by mouth daily.     loratadine 10 MG tablet  Commonly known as:  CLARITIN  Take 10 mg by mouth daily.     mirtazapine 7.5 MG tablet  Commonly known as:  REMERON  Take 7.5 mg by mouth at bedtime. 30 day trial for appetite stimulation. Scheduled to end after 11/27/15.     ondansetron 4 MG tablet  Commonly known as:  ZOFRAN  Take 4 mg by mouth every 4 (four) hours as needed for nausea or vomiting.     TWOCAL HN Liqd  Take 237 mLs by mouth 3 (three) times daily.        No orders of the defined types were placed in this encounter.     There is no immunization history on file for this patient.  Social History  Substance Use Topics  . Smoking status: Former Smoker    Types: Cigarettes  . Smokeless tobacco: Never Used  . Alcohol Use: No    Review of Systems  DATA OBTAINED: from patient, nurse GENERAL:  no fevers, fatigue, appetite changes SKIN: No itching, rash HEENT: No complaint RESPIRATORY: No cough, wheezing, SOB CARDIAC: No chest pain, palpitations, lower extremity edema  GI: No abdominal pain, No N/V/D or constipation, No heartburn or reflux  GU: No dysuria, frequency or urgency, or incontinence  MUSCULOSKELETAL: No unrelieved bone/joint pain NEUROLOGIC: No headache, dizziness  PSYCHIATRIC: No overt anxiety or sadness  Filed Vitals:   12/21/15 1619  BP: 118/71  Pulse: 96  Temp: 96.6 F (35.9 C)  Resp: 20    Physical Exam  GENERAL APPEARANCE: Alert, conversant, pretty BF,No acute distress  SKIN: No diaphoresis rash HEENT: Unremarkable RESPIRATORY: Breathing is even, unlabored. Lung sounds are clear   CARDIOVASCULAR: Heart RRR no murmurs, rubs or gallops. No peripheral edema  GASTROINTESTINAL: Abdomen is soft, non-tender, not  distended w/ normal bowel sounds.  GENITOURINARY: Bladder non tender, not distended  MUSCULOSKELETAL: No abnormal joints or musculature NEUROLOGIC: Cranial nerves 2-12 grossly intact. Moves all extremities PSYCHIATRIC: Mood and affect appropriate to situation, no behavioral issues  Patient Active Problem List   Diagnosis Date Noted  . Loss of weight 11/14/2015  . Bilateral lower extremity edema 09/10/2015  . Chronic respiratory failure with hypercapnia (HCC) 09/03/2015  . GERD without esophagitis 09/03/2015  . Allergic rhinitis due to allergen 09/03/2015  . Anemia of chronic disease 09/03/2015  . COPD (chronic obstructive pulmonary disease) (HCC) 07/07/2015  . CKD (chronic kidney disease) stage 3, GFR 30-59 ml/min 07/07/2015  . Multifocal atrial tachycardia (HCC) 07/07/2015  . Acute encephalopathy 07/01/2015  . AKI (acute kidney injury) (HCC) 07/01/2015  . Altered mental status 06/26/2015  . Acute respiratory failure with hypercapnia (HCC) 06/26/2015    CBC    Component Value Date/Time   WBC 12.0* 07/01/2015 0600   RBC 3.78* 07/01/2015 0600   HGB 10.9* 07/01/2015 0600   HCT 35.8* 07/01/2015 0600   PLT 156 07/01/2015 0600   MCV 94.7 07/01/2015 0600   LYMPHSABS 0.8 06/26/2015 1035   MONOABS 0.4 06/26/2015 1035   EOSABS 0.0 06/26/2015 1035   BASOSABS 0.0 06/26/2015 1035    CMP     Component Value Date/Time   NA 142 07/02/2015 0455   K 5.0 07/02/2015 0455   CL 106 07/02/2015 0455   CO2 30 07/02/2015 0455   GLUCOSE 97 07/02/2015 0455   BUN 50* 07/02/2015 0455   CREATININE 1.79* 07/02/2015 0455   CALCIUM 9.5 07/02/2015 0455   PROT 5.6* 07/01/2015 0600   ALBUMIN 2.9* 07/01/2015 0600   AST 11* 07/01/2015 0600   ALT 13* 07/01/2015 0600   ALKPHOS 50 07/01/2015 0600   BILITOT 0.6 07/01/2015 0600   GFRNONAA 24* 07/02/2015 0455   GFRAA 28* 07/02/2015 0455    Assessment and Plan  Multifocal atrial tachycardia continue cardizem 30 mg three times daily for rate control  heart rate is presently stable  Anemia of chronic disease will continue iron daily hgb is 10.9  Allergic rhinitis due to allergen will continue claritin 10 mg daily    Margit Hanks, MD

## 2015-12-27 ENCOUNTER — Encounter: Payer: Self-pay | Admitting: Internal Medicine

## 2015-12-27 NOTE — Assessment & Plan Note (Signed)
will continue iron daily hgb is 10.9

## 2015-12-27 NOTE — Assessment & Plan Note (Signed)
continue cardizem 30 mg three times daily for rate control heart rate is presently stable

## 2015-12-27 NOTE — Assessment & Plan Note (Signed)
will continue claritin 10 mg daily

## 2016-01-14 ENCOUNTER — Non-Acute Institutional Stay (SKILLED_NURSING_FACILITY): Payer: Medicare HMO | Admitting: Internal Medicine

## 2016-01-14 ENCOUNTER — Encounter: Payer: Self-pay | Admitting: Internal Medicine

## 2016-01-14 DIAGNOSIS — R600 Localized edema: Secondary | ICD-10-CM

## 2016-01-14 DIAGNOSIS — R634 Abnormal weight loss: Secondary | ICD-10-CM | POA: Diagnosis not present

## 2016-01-14 DIAGNOSIS — J449 Chronic obstructive pulmonary disease, unspecified: Secondary | ICD-10-CM | POA: Diagnosis not present

## 2016-01-14 DIAGNOSIS — N183 Chronic kidney disease, stage 3 unspecified: Secondary | ICD-10-CM

## 2016-01-14 NOTE — Progress Notes (Addendum)
MRN: [id number] Name: Andrea BenchWillie Baldwin Weber  Sex: female Age: 82 y.o. DOB: 05/25/1926  [name], Lowana Hable D Emergency Contacts: Extended Emergency Contact Information Primary Emergency Contact: Mattocks,Clarence Address: [address]          [place],  [id number] [place] Home Phone: [phone] Work Phone: [phone] Relation: Other Secondary Emergency Contact: Hetty ElyMattocks,Martha  [place] Mobile Phone: [phone] Relation: Other  Code Status:   Allergies: Review of patient's allergies indicates no known allergies.  Chief Complaint  Patient presents with  . Medical Management of Chronic Issues    HPI: Patient is 82 y.o. female with hx SAH, HTN, COPD, GERD, CKD3, anemia who is being seen today for routine issues CKD3, COPD and BLE edema.  Past Medical History  Diagnosis Date  . Subarachnoid hemorrhage (HCC)   . Asthma   . Hypertension     Past Surgical History  Procedure Laterality Date  . Lung surgery    . Abdominal hysterectomy        Medication List       This list is accurate as of: 01/14/16 11:59 PM.  Always use your most recent med list.               albuterol 108 (90 Base) MCG/ACT inhaler  Commonly known as:  PROVENTIL HFA;VENTOLIN HFA  Inhale 2 puffs into the lungs every 6 (six) hours as needed for wheezing or shortness of breath.     cholecalciferol 1000 units tablet  Commonly known as:  VITAMIN D  Take 1,000 Units by mouth daily.     diltiazem 30 MG tablet  Commonly known as:  CARDIZEM  Take 1 tablet (30 mg total) by mouth every 8 (eight) hours.     diphenhydrAMINE 25 mg capsule  Commonly known as:  BENADRYL  Take 1 capsule (25 mg total) by mouth every 8 (eight) hours as needed for itching.     famotidine 20 MG tablet  Commonly known as:  PEPCID  Take 1 tablet (20 mg total) by mouth daily.     ferrous sulfate 325 (65 FE) MG tablet  Take  325 mg by mouth daily with breakfast.     Fluticasone-Salmeterol 250-50 MCG/DOSE Aepb  Commonly known as:  ADVAIR  Inhale 1 puff into the lungs 2 (two) times daily.     furosemide 20 MG tablet  Commonly known as:  LASIX  Take 1 tablet (20 mg total) by mouth daily.     loratadine 10 MG tablet  Commonly known as:  CLARITIN  Take 10 mg by mouth daily.     mirtazapine 7.5 MG tablet  Commonly known as:  REMERON  Take 7.5 mg by mouth at bedtime. 30 day trial for appetite stimulation. Scheduled to end after 11/27/15.     ondansetron 4 MG tablet  Commonly known as:  ZOFRAN  Take 4 mg by mouth every 4 (four) hours as needed for nausea or vomiting.     TWOCAL HN Liqd  Take 237 mLs by mouth 3 (three) times daily.        No orders of the defined types were placed in this encounter.     There is no immunization history on file for this patient.  Social History  Substance Use Topics  . Smoking status: Former Smoker    Types: Cigarettes  . Smokeless tobacco: Never Used  . Alcohol Use: No    Review of Systems  DATA OBTAINED: from patient, nurse GENERAL:  no fevers, fatigue, appetite changes SKIN: No itching, rash HEENT: No complaint RESPIRATORY: No cough, wheezing, SOB CARDIAC: No chest pain, palpitations, lower extremity edema  GI: No abdominal pain, No N/V/D or constipation, No heartburn or reflux  GU: No dysuria, frequency or urgency, or incontinence  MUSCULOSKELETAL: No unrelieved bone/joint pain NEUROLOGIC: No headache, dizziness  PSYCHIATRIC: No overt anxiety or sadness  Filed Vitals:   01/14/16 1253  BP: 118/71  Pulse: 96  Temp: 96.6 F (35.9 C)  Resp: 20    Physical Exam  GENERAL APPEARANCE: Alert, conversant, No acute distress  SKIN: No diaphoresis rash HEENT: Unremarkable RESPIRATORY: Breathing is even, unlabored. Lung sounds are clear   CARDIOVASCULAR: Heart RRR no murmurs, rubs or gallops. No peripheral edema  GASTROINTESTINAL: Abdomen is soft,  non-tender, not distended w/ normal bowel sounds.  GENITOURINARY: Bladder non tender, not distended  MUSCULOSKELETAL: No abnormal joints or musculature NEUROLOGIC: Cranial nerves 2-12 grossly intact. Moves all extremities PSYCHIATRIC: Mood and affect appropriate to situation, no behavioral issues  Patient Active Problem List   Diagnosis Date Noted  . Bilateral lower extremity edema 09/10/2015  . Chronic respiratory failure with hypercapnia (HCC) 09/03/2015  . GERD without esophagitis 09/03/2015  . Allergic rhinitis due to allergen 09/03/2015  . Anemia of chronic disease 09/03/2015  . COPD (chronic obstructive pulmonary disease) (HCC) 07/07/2015  . CKD (chronic kidney disease) stage 3, GFR 30-59 ml/min 07/07/2015  . Multifocal atrial tachycardia (HCC) 07/07/2015  . Acute encephalopathy 07/01/2015  . AKI (acute kidney injury) (HCC) 07/01/2015  . Altered mental status 06/26/2015  . Acute respiratory failure with hypercapnia (HCC) 06/26/2015    CBC    Component Value Date/Time   WBC 12.0* 07/01/2015 0600   RBC 3.78* 07/01/2015 0600   HGB 10.9* 07/01/2015 0600   HCT 35.8* 07/01/2015 0600   PLT 156 07/01/2015 0600   MCV 94.7 07/01/2015 0600   LYMPHSABS 0.8 06/26/2015 1035   MONOABS 0.4 06/26/2015 1035   EOSABS 0.0 06/26/2015 1035   BASOSABS 0.0 06/26/2015 1035    CMP     Component Value Date/Time   NA 142 07/02/2015 0455   K 5.0 07/02/2015 0455   CL 106 07/02/2015 0455   CO2 30 07/02/2015 0455   GLUCOSE 97 07/02/2015 0455   BUN 50* 07/02/2015 0455   CREATININE 1.79* 07/02/2015 0455   CALCIUM 9.5 07/02/2015 0455   PROT 5.6* 07/01/2015 0600   ALBUMIN 2.9* 07/01/2015 0600   AST 11* 07/01/2015 0600   ALT 13* 07/01/2015 0600   ALKPHOS 50 07/01/2015 0600   BILITOT 0.6 07/01/2015 0600   GFRNONAA 24* 07/02/2015 0455   GFRAA 28* 07/02/2015 0455    Assessment and Plan  CKD (chronic kidney disease) stage 3, GFR 30-59 ml/min Stable; current GFR 34, 27/1.53 basically  unchanged from prior;will monitor at intervals  Bilateral lower extremity edema Controlled on lasix 20 mg daily;plan - will continue  COPD (chronic obstructive pulmonary disease) Chronic without known exacerbations; plan - cont scheduled and prn nebs    Andrea Hanks, MD

## 2016-01-15 LAB — BASIC METABOLIC PANEL
BUN: 34 mg/dL — AB (ref 4–21)
Creatinine: 1.6 mg/dL — AB (ref <=1.1)
GLUCOSE: 72 mg/dL
Potassium: 4.4 mmol/L (ref 3.4–5.3)
SODIUM: 148 mmol/L — AB (ref 137–147)

## 2016-01-15 LAB — CBC AND DIFFERENTIAL
HEMATOCRIT: 29 % — AB (ref 36–46)
Hemoglobin: 8.7 g/dL — AB (ref 12.0–16.0)
Platelets: 132 10*3/uL — AB (ref 150–399)
WBC: 4.3 10*3/mL

## 2016-01-17 ENCOUNTER — Encounter: Payer: Self-pay | Admitting: Internal Medicine

## 2016-01-17 NOTE — Assessment & Plan Note (Deleted)
Pt's fosamax and lipitor were d/c 2/2 to weight loss; will monitor wight off these drugs 

## 2016-01-17 NOTE — Assessment & Plan Note (Signed)
Stable; current GFR 34, 27/1.53 basically unchanged from prior;will monitor at intervals

## 2016-01-17 NOTE — Assessment & Plan Note (Signed)
Chronic without known exacerbations; plan - cont scheduled and prn nebs

## 2016-01-17 NOTE — Assessment & Plan Note (Signed)
Controlled on lasix 20 mg daily;plan - will continue

## 2016-02-23 ENCOUNTER — Encounter: Payer: Self-pay | Admitting: Adult Health

## 2016-02-23 ENCOUNTER — Non-Acute Institutional Stay (SKILLED_NURSING_FACILITY): Payer: Medicare HMO | Admitting: Adult Health

## 2016-02-23 DIAGNOSIS — J309 Allergic rhinitis, unspecified: Secondary | ICD-10-CM

## 2016-02-23 DIAGNOSIS — R600 Localized edema: Secondary | ICD-10-CM | POA: Diagnosis not present

## 2016-02-23 DIAGNOSIS — J449 Chronic obstructive pulmonary disease, unspecified: Secondary | ICD-10-CM | POA: Diagnosis not present

## 2016-02-23 DIAGNOSIS — N183 Chronic kidney disease, stage 3 unspecified: Secondary | ICD-10-CM

## 2016-02-23 DIAGNOSIS — K219 Gastro-esophageal reflux disease without esophagitis: Secondary | ICD-10-CM

## 2016-02-23 DIAGNOSIS — I471 Supraventricular tachycardia: Secondary | ICD-10-CM

## 2016-02-23 DIAGNOSIS — J9612 Chronic respiratory failure with hypercapnia: Secondary | ICD-10-CM | POA: Diagnosis not present

## 2016-02-23 DIAGNOSIS — D638 Anemia in other chronic diseases classified elsewhere: Secondary | ICD-10-CM

## 2016-02-23 LAB — VITAMIN D 1,25 DIHYDROXY: Vit D, 25-Hydroxy: 47.57

## 2016-02-23 NOTE — Progress Notes (Signed)
Patient ID: Andrea Weber, female   DOB: 11-15-1925, 82 y.o.   MRN: [id number]   Facility:  [facility]       No Known Allergies  Chief Complaint  Patient presents with  . Medical Management of Chronic Issues    Follow up    HPI:  She is a long term resident of this facility being seen for the management of her chronic illnesses. Overall her status is without significant change. Her current weight is 117 pounds; she had been remeron back in Dec for 30 days without improvement. She is not voicing any complaints or concerns at this time. There are no nursing concerns at this time.   Past Medical History  Diagnosis Date  . Subarachnoid hemorrhage (HCC)   . Asthma   . Hypertension     Past Surgical History  Procedure Laterality Date  . Lung surgery    . Abdominal hysterectomy      VITAL SIGNS BP 128/65 mmHg  Pulse 68  Temp(Src) 98.4 F (36.9 C) (Oral)  Resp 18  Ht 5\' 1"  (1.549 m)  Wt 117 lb (53.071 kg)  BMI 22.12 kg/m2  SpO2 96%  Patient's Medications  New Prescriptions   No medications on file  Previous Medications   ALBUTEROL (PROVENTIL HFA;VENTOLIN HFA) 108 (90 BASE) MCG/ACT INHALER    Inhale 2 puffs into the lungs every 6 (six) hours as needed for wheezing or shortness of breath.   CHOLECALCIFEROL (VITAMIN D) 1000 UNITS TABLET    Take 1,000 Units by mouth daily.   DILTIAZEM (CARDIZEM) 30 MG TABLET    Take 1 tablet (30 mg total) by mouth every 8 (eight) hours.   DIPHENHYDRAMINE (BENADRYL) 25 MG CAPSULE    Take 1 capsule (25 mg total) by mouth every 8 (eight) hours as needed for itching.   FAMOTIDINE (PEPCID) 20 MG TABLET    Take 1 tablet (20 mg total) by mouth daily.   FERROUS SULFATE 325 (65 FE) MG TABLET    Take 325 mg by mouth daily with breakfast.   FLUTICASONE-SALMETEROL (ADVAIR) 250-50 MCG/DOSE AEPB    Inhale 1 puff into the lungs 2 (two) times daily.   FUROSEMIDE (LASIX) 20 MG TABLET    Take 1 tablet (20 mg total) by mouth daily.   LORATADINE  (CLARITIN) 10 MG TABLET    Take 10 mg by mouth daily.   NUTRITIONAL SUPPLEMENTS (TWOCAL HN) LIQD    Take 237 mLs by mouth 3 (three) times daily.   ONDANSETRON (ZOFRAN) 4 MG TABLET    Take 4 mg by mouth every 4 (four) hours as needed for nausea or vomiting.  Modified Medications   No medications on file  Discontinued Medications     SIGNIFICANT DIAGNOSTIC EXAMS  06-26-15: chest x-ray: Cardiomegaly and pulmonary vascular congestion.  No acute findings.  06-26-15: ct of head: Cardiomegaly and pulmonary vascular congestion.  No acute findings.  06-29-15: 2-d echo: - Left ventricle: The cavity size was normal. Wall thickness was increased in a pattern of mild LVH. Systolic function was vigorous. The estimated ejection fraction was in the range of 65% to 70%. Wall motion was normal; there were no regional wall motion abnormalities. Doppler parameters are consistent with abnormal left ventricular relaxation (grade 1 diastolic dysfunction). Doppler parameters are consistent with high ventricular filling pressure. - Right ventricle: Systolic function was mildly reduced. - Pulmonary arteries: PA peak pressure: 36 mm Hg (S). - Pericardium, extracardiac: A trivial pericardial effusion was identified.  06-30-15: mri of brain:  1.  No acute intracranial abnormality. 2. Mild to moderate for age nonspecific signal changes in the brain, most commonly due to chronic small vessel disease.   LABS REVIEWED:   06-26-15; wbc 9.2; hgb 11.2; hct 35.9; mcv 98.4; plt 151; glucose 133; bun 34; creat 2.22; k+ 4.6; na++142; ast 54; albumin 3.9; mag 2.2; tsh 0.626; hgb a1c 4.9; blood culture: no growth 06-28-15; wbc 12.1; hgb 11.4; hct 35.5; mcv 92.9; plt 166; glucose 129; bun 44; creat 1.92; k+ 4.6; na++142; liver normal albumin 3.5 07-01-15: wbc 12.0; hgb 10.9; hct 35.8; mcv 94.7; plt 156; glucose 144; bun 50; creat 1.93; k+ 4.1; na++142; liver normal albumin 2.9  07-02-15: glucose 97; bun 50; creat 1.79; k+ 5.0; na++142    07-08-15; glucose 144; bun 34.9; creat 1.54; k+ 4.2; na++147; liver normal albumin 3.1 11-03-16: glucose 106; bun 27.2; creat 1.53; k+ 5.3; na++146;  01-15-16: wbc 4.3 ;hgb 8.7; hct 28.8; mcv 95.4; plt 132; glucose 72; bun 34.1; creat 1.64; k+ 4.4; na++148;  vitamin D 47.57       Review of Systems  Constitutional: Negative for appetite change and fatigue.  HENT: Negative for congestion.   Respiratory: Negative for cough, chest tightness and shortness of breath.   Cardiovascular: Negative for chest pain, palpitations and leg swelling.  Gastrointestinal: Negative for nausea, abdominal pain, diarrhea and constipation.  Musculoskeletal: Negative for myalgias and arthralgias.  Skin: Negative for pallor.  Psychiatric/Behavioral: The patient is not nervous/anxious.       Physical Exam  Constitutional: She is oriented to person, place, and time. No distress.  Thin   Eyes: Conjunctivae are normal.  Neck: Neck supple. No JVD present. No thyromegaly present.  Cardiovascular: Normal rate and intact distal pulses.   Murmur heard. Murmur 1/6 Heart rate irregularly irregular   Respiratory: Effort normal. No respiratory distress.  Breath sounds diminished   GI: Soft. Bowel sounds are normal. She exhibits no distension. There is no tenderness.  Musculoskeletal: She exhibits no edema.  Able to move all extremities   Lymphadenopathy:    She has no cervical adenopathy.  Neurological: She is alert and oriented to person, place, and time.  Skin: Skin is warm and dry. She is not diaphoretic.  Psychiatric: She has a normal mood and affect.     ASSESSMENT/ PLAN:  1. COPD: is stable will continue albuterol 2 puffs every 6 hours as needed; advair 250/50 twice daily will monitor  2. Chronic respiratory failure: is on bipap nightly will monitor   3. Atrial tachycardia: will continue cardizem 30 mg three times daily for rate control heart rate is presently stable  4. Edema: is presently stable  will lower to  lasix 20 mg daily will monitor   5. Anemia: will continue iron daily hgb is 8.7  6. Allergic rhinitis: will continue claritin 10 mg daily  7. Gerd: will continue pepcid 20 mg daily   8. Chronic kidney disease stage III: is stable her last creat is 1.64  9. Weight loss: her current weight is 117 pounds; her weight is Dec 2016 was 123 pounds. Will continue supplements per facility protocol and will monitor       Synthia Innocent NP [facility]  Contact [phone] Monday through Friday 8am- 5pm  After hours call [phone]

## 2016-02-29 ENCOUNTER — Inpatient Hospital Stay (HOSPITAL_COMMUNITY)
Admission: EM | Admit: 2016-02-29 | Discharge: 2016-03-04 | DRG: 190 | Disposition: A | Discharge status: Skilled Nursing Facility | Payer: Medicare HMO | Attending: Internal Medicine | Admitting: Internal Medicine

## 2016-02-29 ENCOUNTER — Emergency Department (HOSPITAL_COMMUNITY): Payer: Medicare HMO

## 2016-02-29 ENCOUNTER — Encounter (HOSPITAL_COMMUNITY): Payer: Self-pay | Admitting: Emergency Medicine

## 2016-02-29 ENCOUNTER — Non-Acute Institutional Stay (SKILLED_NURSING_FACILITY): Payer: Medicare HMO | Admitting: Internal Medicine

## 2016-02-29 DIAGNOSIS — J441 Chronic obstructive pulmonary disease with (acute) exacerbation: Principal | ICD-10-CM | POA: Diagnosis present

## 2016-02-29 DIAGNOSIS — Z8249 Family history of ischemic heart disease and other diseases of the circulatory system: Secondary | ICD-10-CM

## 2016-02-29 DIAGNOSIS — D638 Anemia in other chronic diseases classified elsewhere: Secondary | ICD-10-CM | POA: Diagnosis present

## 2016-02-29 DIAGNOSIS — R0602 Shortness of breath: Secondary | ICD-10-CM

## 2016-02-29 DIAGNOSIS — I129 Hypertensive chronic kidney disease with stage 1 through stage 4 chronic kidney disease, or unspecified chronic kidney disease: Secondary | ICD-10-CM | POA: Diagnosis present

## 2016-02-29 DIAGNOSIS — E860 Dehydration: Secondary | ICD-10-CM | POA: Diagnosis present

## 2016-02-29 DIAGNOSIS — N179 Acute kidney failure, unspecified: Secondary | ICD-10-CM | POA: Diagnosis present

## 2016-02-29 DIAGNOSIS — J9622 Acute and chronic respiratory failure with hypercapnia: Secondary | ICD-10-CM | POA: Diagnosis present

## 2016-02-29 DIAGNOSIS — E872 Acidosis: Secondary | ICD-10-CM | POA: Diagnosis present

## 2016-02-29 DIAGNOSIS — Z79899 Other long term (current) drug therapy: Secondary | ICD-10-CM

## 2016-02-29 DIAGNOSIS — J189 Pneumonia, unspecified organism: Secondary | ICD-10-CM

## 2016-02-29 DIAGNOSIS — I4719 Other supraventricular tachycardia: Secondary | ICD-10-CM | POA: Diagnosis present

## 2016-02-29 DIAGNOSIS — Z87891 Personal history of nicotine dependence: Secondary | ICD-10-CM

## 2016-02-29 DIAGNOSIS — R7989 Other specified abnormal findings of blood chemistry: Secondary | ICD-10-CM | POA: Diagnosis present

## 2016-02-29 DIAGNOSIS — E870 Hyperosmolality and hypernatremia: Secondary | ICD-10-CM | POA: Diagnosis present

## 2016-02-29 DIAGNOSIS — J9602 Acute respiratory failure with hypercapnia: Secondary | ICD-10-CM | POA: Diagnosis present

## 2016-02-29 DIAGNOSIS — G9341 Metabolic encephalopathy: Secondary | ICD-10-CM | POA: Diagnosis present

## 2016-02-29 DIAGNOSIS — N183 Chronic kidney disease, stage 3 (moderate): Secondary | ICD-10-CM | POA: Diagnosis present

## 2016-02-29 DIAGNOSIS — E875 Hyperkalemia: Secondary | ICD-10-CM | POA: Clinically undetermined

## 2016-02-29 DIAGNOSIS — N184 Chronic kidney disease, stage 4 (severe): Secondary | ICD-10-CM | POA: Diagnosis present

## 2016-02-29 DIAGNOSIS — Z9981 Dependence on supplemental oxygen: Secondary | ICD-10-CM

## 2016-02-29 DIAGNOSIS — K219 Gastro-esophageal reflux disease without esophagitis: Secondary | ICD-10-CM | POA: Diagnosis present

## 2016-02-29 DIAGNOSIS — I471 Supraventricular tachycardia: Secondary | ICD-10-CM | POA: Diagnosis present

## 2016-02-29 DIAGNOSIS — J449 Chronic obstructive pulmonary disease, unspecified: Secondary | ICD-10-CM | POA: Diagnosis present

## 2016-02-29 DIAGNOSIS — R4182 Altered mental status, unspecified: Secondary | ICD-10-CM | POA: Diagnosis present

## 2016-02-29 DIAGNOSIS — T380X5A Adverse effect of glucocorticoids and synthetic analogues, initial encounter: Secondary | ICD-10-CM | POA: Diagnosis present

## 2016-02-29 HISTORY — DX: Chronic obstructive pulmonary disease, unspecified: J44.9

## 2016-02-29 HISTORY — DX: Anemia, unspecified: D64.9

## 2016-02-29 HISTORY — DX: Cardiac arrhythmia, unspecified: I49.9

## 2016-02-29 HISTORY — DX: Chronic kidney disease, unspecified: N18.9

## 2016-02-29 LAB — CBC WITH DIFFERENTIAL/PLATELET
BASOS ABS: 0 10*3/uL (ref 0.0–0.1)
BASOS PCT: 0 %
EOS ABS: 0 10*3/uL (ref 0.0–0.7)
Eosinophils Relative: 0 %
HEMATOCRIT: 35.7 % — AB (ref 36.0–46.0)
HEMOGLOBIN: 11.2 g/dL — AB (ref 12.0–15.0)
Lymphocytes Relative: 6 %
Lymphs Abs: 0.7 10*3/uL (ref 0.7–4.0)
MCH: 30 pg (ref 26.0–34.0)
MCHC: 31.4 g/dL (ref 30.0–36.0)
MCV: 95.7 fL (ref 78.0–100.0)
MONOS PCT: 1 %
Monocytes Absolute: 0.1 10*3/uL (ref 0.1–1.0)
NEUTROS ABS: 10.6 10*3/uL — AB (ref 1.7–7.7)
NEUTROS PCT: 93 %
Platelets: 228 10*3/uL (ref 150–400)
RBC: 3.73 MIL/uL — AB (ref 3.87–5.11)
RDW: 14.3 % (ref 11.5–15.5)
WBC: 11.4 10*3/uL — AB (ref 4.0–10.5)

## 2016-02-29 LAB — URINALYSIS, ROUTINE W REFLEX MICROSCOPIC
Bilirubin Urine: NEGATIVE
Glucose, UA: NEGATIVE mg/dL
Ketones, ur: NEGATIVE mg/dL
Leukocytes, UA: NEGATIVE
NITRITE: NEGATIVE
PROTEIN: 30 mg/dL — AB
SPECIFIC GRAVITY, URINE: 1.014 (ref 1.005–1.030)
pH: 5 (ref 5.0–8.0)

## 2016-02-29 LAB — URINE MICROSCOPIC-ADD ON

## 2016-02-29 LAB — CBG MONITORING, ED: GLUCOSE-CAPILLARY: 126 mg/dL — AB (ref 65–99)

## 2016-02-29 LAB — I-STAT CG4 LACTIC ACID, ED: LACTIC ACID, VENOUS: 0.7 mmol/L (ref 0.5–2.0)

## 2016-02-29 NOTE — ED Provider Notes (Signed)
CSN: [id number]     Arrival date & time 02/29/16  2054 History   First MD Initiated Contact with Patient 02/29/16 2112     Chief Complaint  Patient presents with  . Altered Mental Status     (Consider location/radiation/quality/duration/timing/severity/associated sxs/prior Treatment) Patient is a 82 y.o. female presenting with altered mental status. The history is provided by the EMS personnel.  Altered Mental Status Presenting symptoms: confusion, disorientation, lethargy and partial responsiveness   Severity:  Moderate Most recent episode:  Today Episode history:  Multiple Duration:  1 day Timing:  Intermittent Progression:  Waxing and waning Chronicity:  New Context: nursing home resident   Associated symptoms: slurred speech and weakness   Associated symptoms: no agitation, no difficulty breathing, no fever and no vomiting     Past Medical History  Diagnosis Date  . Subarachnoid hemorrhage (HCC)   . Asthma   . Hypertension    Past Surgical History  Procedure Laterality Date  . Lung surgery    . Abdominal hysterectomy     Family History  Problem Relation Age of Onset  . Hypertension Mother   . Hypertension Father    Social History  Substance Use Topics  . Smoking status: Former Smoker    Types: Cigarettes  . Smokeless tobacco: Never Used  . Alcohol Use: No   OB History    No data available     Review of Systems  Unable to perform ROS: Mental status change  Constitutional: Negative for fever.  Gastrointestinal: Negative for vomiting.  Neurological: Positive for weakness.  Psychiatric/Behavioral: Positive for confusion. Negative for agitation.      Allergies  Review of patient's allergies indicates no known allergies.  Home Medications   Prior to Admission medications   Medication Sig Start Date End Date Taking? Authorizing Provider  albuterol (ACCUNEB) 0.63 MG/3ML nebulizer solution Take 1 ampule by nebulization every 4 (four) hours as needed for  wheezing.   Yes Historical Provider, MD  albuterol (PROVENTIL HFA;VENTOLIN HFA) 108 (90 BASE) MCG/ACT inhaler Inhale 2 puffs into the lungs every 6 (six) hours as needed for wheezing or shortness of breath.   Yes Historical Provider, MD  cholecalciferol (VITAMIN D) 1000 UNITS tablet Take 1,000 Units by mouth daily.   Yes Historical Provider, MD  diltiazem (CARDIZEM) 30 MG tablet Take 1 tablet (30 mg total) by mouth every 8 (eight) hours. 07/02/15  Yes Costin Otelia Sergeant, MD  diphenhydrAMINE (BENADRYL) 25 mg capsule Take 1 capsule (25 mg total) by mouth every 8 (eight) hours as needed for itching. 01/28/14  Yes Susy Frizzle, MD  famotidine (PEPCID) 20 MG tablet Take 1 tablet (20 mg total) by mouth daily. 01/28/14  Yes Susy Frizzle, MD  ferrous sulfate 325 (65 FE) MG tablet Take 325 mg by mouth daily with breakfast.   Yes Historical Provider, MD  Fluticasone-Salmeterol (ADVAIR) 250-50 MCG/DOSE AEPB Inhale 1 puff into the lungs 2 (two) times daily.   Yes Historical Provider, MD  furosemide (LASIX) 20 MG tablet Take 1 tablet (20 mg total) by mouth daily. 11/14/15  Yes Sharee Holster, NP  guaiFENesin (MUCINEX) 600 MG 12 hr tablet Take 600 mg by mouth 2 (two) times daily. For 7 days   Yes Historical Provider, MD  ipratropium-albuterol (DUONEB) 0.5-2.5 (3) MG/3ML SOLN Take 3 mLs by nebulization every 6 (six) hours as needed (congestion).   Yes Historical Provider, MD  moxifloxacin (AVELOX) 400 MG tablet Take 400 mg by mouth daily at 8 pm. For 7 days  Yes Historical Provider, MD  Nutritional Supplements (TWOCAL HN) LIQD Take 237 mLs by mouth 3 (three) times daily.   Yes Historical Provider, MD   BP 143/89 mmHg  Pulse 91  Temp(Src) 99.1 F (37.3 C) (Rectal)  Resp 16  SpO2 98% Physical Exam  Constitutional: She appears well-developed. No distress.  HENT:  Head: Normocephalic.  Eyes: Conjunctivae are normal.  Neck: Neck supple. No tracheal deviation present.  Cardiovascular: Normal rate, regular  rhythm and normal heart sounds.   Pulmonary/Chest: Effort normal and breath sounds normal. No respiratory distress.  Abdominal: Soft. Bowel sounds are normal. She exhibits no distension. There is no tenderness. There is no rebound and no guarding.  Neurological: She is disoriented. No cranial nerve deficit. GCS eye subscore is 3. GCS verbal subscore is 3. GCS motor subscore is 6.  Skin: Skin is warm and dry.  Psychiatric: Her affect is blunt. She is slowed.  Vitals reviewed.   ED Course  Procedures (including critical care time)  CRITICAL CARE Performed by: Lyndal Pulley Total critical care time: 30 minutes Critical care time was exclusive of separately billable procedures and treating other patients. Critical care was necessary to treat or prevent imminent or life-threatening deterioration. Critical care was time spent personally by me on the following activities: development of treatment plan with patient and/or surrogate as well as nursing, discussions with consultants, evaluation of patient's response to treatment, examination of patient, obtaining history from patient or surrogate, ordering and performing treatments and interventions, ordering and review of laboratory studies, ordering and review of radiographic studies, pulse oximetry and re-evaluation of patient's condition.   Procedure note: Ultrasound Guided Peripheral IV Ultrasound guided peripheral 1.88 inch angiocath IV placement performed by me. Indications: Nursing unable to place IV. Details: The antecubital fossa and upper arm were evaluated with a multifrequency linear probe. Patent brachial veins were noted. 1 attempt was made to cannulate a vein under realtime US guidance with successful cannulation of the vein and catheter placement. There is return of non-pulsatile dark red blood. The patient tolerated the procedure well without complications. Images archived electronically.  CPT codes: 16109 and 36410   Labs  Review Labs Reviewed  CBC WITH DIFFERENTIAL/PLATELET - Abnormal; Notable for the following:    WBC 11.4 (*)    RBC 3.73 (*)    Hemoglobin 11.2 (*)    HCT 35.7 (*)    Neutro Abs 10.6 (*)    All other components within normal limits  URINALYSIS, ROUTINE W REFLEX MICROSCOPIC (NOT AT [facility]) - Abnormal; Notable for the following:    APPearance CLOUDY (*)    Hgb urine dipstick TRACE (*)    Protein, ur 30 (*)    All other components within normal limits  URINE MICROSCOPIC-ADD ON - Abnormal; Notable for the following:    Squamous Epithelial / LPF 0-5 (*)    Bacteria, UA RARE (*)    Casts HYALINE CASTS (*)    All other components within normal limits  CBG MONITORING, ED - Abnormal; Notable for the following:    Glucose-Capillary 126 (*)    All other components within normal limits  URINE CULTURE  COMPREHENSIVE METABOLIC PANEL  BLOOD GAS, VENOUS  I-STAT CG4 LACTIC ACID, ED    Imaging Review Ct Head Wo Contrast  03/01/2016  CLINICAL DATA:  Altered mental status EXAM: CT HEAD WITHOUT CONTRAST TECHNIQUE: Contiguous axial images were obtained from the base of the skull through the vertex without intravenous contrast. COMPARISON:  06/26/2015 FINDINGS: Skull and Sinuses:Negative  for acute fracture or destructive process. No sinus or mastoid fluid levels. Visualized orbits: Remote blowout fracture of the medial wall left orbit. Brain: No evidence of acute infarction, hemorrhage, hydrocephalus, or mass lesion/mass effect. Chronic small vessel disease with stable pattern since 2016. Ischemic gliosis is confluent in the deep cerebral white matter. IMPRESSION: No acute finding.  Stable senescent changes. Electronically Signed   By: Marnee SpringJonathon  Watts M.D.   On: 03/01/2016 00:01   Dg Chest Port 1 View  02/29/2016  CLINICAL DATA:  Acute onset of shortness of breath and altered mental status. Initial encounter. EXAM: PORTABLE CHEST 1 VIEW COMPARISON:  Chest radiograph performed 11/15/2015 FINDINGS: The lungs  are well-aerated. A small left pleural effusion is noted. Vascular congestion is seen. Increased interstitial markings may reflect mild interstitial edema. There is no evidence of pneumothorax. The cardiomediastinal silhouette is mildly enlarged. No acute osseous abnormalities are seen. IMPRESSION: Small left pleural effusion noted. Vascular congestion and mild cardiomegaly. Increased interstitial markings may reflect mild interstitial edema. Electronically Signed   By: Roanna RaiderJeffery  Chang M.D.   On: 02/29/2016 22:26   I have personally reviewed and evaluated these images and lab results as part of my medical decision-making.   EKG Interpretation   Date/Time:  Tuesday March 01 2016 01:34:00 EDT Ventricular Rate:  88 PR Interval:  164 QRS Duration: 96 QT Interval:  385 QTC Calculation: 466 R Axis:   -95 Text Interpretation:  Sinus rhythm Atrial premature complexes Inferior  infarct, old Anterior infarct, old No significant change since last  tracing Confirmed by Terica Yogi MD, Joee Iovine ([id number]) on 03/01/2016 1:56:05 AM      MDM   Final diagnoses:  Acute on chronic respiratory failure with hypercapnia (HCC)  AKI (acute kidney injury) (HCC)    82 y.o. female presents with An episode of altered mental status that was noted at her nursing facility earlier today. She had initially been seen by EMS earlier in the day after the nursing facility asked them to place an IV to give her an unknown medication which is not documented in her transfer sheet. They later were called out again after the patient pulled her IV out and bled all over the floor. They got the arm hemostatic but the facility told them that she had been confused and needed to be transported to the hospital. On arrival she was alert and oriented but while awaiting reassessment became somnolent and disoriented compared to baseline. She appears very slowed and is taking shallow respirations. Venous blood gas is concerning for respiratory acidosis and  signs of hypercapnic respiratory failure. The patient will be placed on BiPAP, she has a known oxygen requirement she is unchanged. Head CT is without any acute findings and no evidence of infection or air space disease.   Unclear what the cause of her hypercapnia is currently. Will require admission for further monitoring and rechecks of her mental status. No family was available at bedside and no information was provided from the nursing facility but previous admission from 8 months ago lists patient as DO NOT RESUSCITATE. Hospitalist was consulted for admission and will see the patient in the emergency department.    Lyndal Pulleyaniel Breklyn Fabrizio, MD 03/01/16 [phone]

## 2016-02-29 NOTE — ED Notes (Signed)
Bed: WA09 Expected date:  Expected time:  Means of arrival:  Comments: EMS 82 yo F Altered Mental Status

## 2016-02-29 NOTE — ED Notes (Signed)
I ATTEMPTED TO COLLECT LABS AND WAS UNSUCCESSFUL. 

## 2016-02-29 NOTE — ED Notes (Addendum)
Pt BIB EMS from [facility]; facility called 911 earlier today to insert an IV for "new orders"; per EMS, facility stated that pt later pulled her IV out and was "bleeding"; facility also stated that pt was "altered" but was unable to state any symptoms; pt A&O x4 in triage and able to do serial additions; EMS [facility] something regarding vaginal bleeding but gave no other information; pt denies pain/discomfort; VS stable

## 2016-03-01 ENCOUNTER — Inpatient Hospital Stay (HOSPITAL_COMMUNITY): Payer: Medicare HMO

## 2016-03-01 ENCOUNTER — Encounter (HOSPITAL_COMMUNITY): Payer: Self-pay | Admitting: Family Medicine

## 2016-03-01 DIAGNOSIS — Z79899 Other long term (current) drug therapy: Secondary | ICD-10-CM | POA: Diagnosis not present

## 2016-03-01 DIAGNOSIS — E875 Hyperkalemia: Secondary | ICD-10-CM | POA: Clinically undetermined

## 2016-03-01 DIAGNOSIS — I129 Hypertensive chronic kidney disease with stage 1 through stage 4 chronic kidney disease, or unspecified chronic kidney disease: Secondary | ICD-10-CM | POA: Diagnosis present

## 2016-03-01 DIAGNOSIS — J441 Chronic obstructive pulmonary disease with (acute) exacerbation: Secondary | ICD-10-CM | POA: Diagnosis present

## 2016-03-01 DIAGNOSIS — R0602 Shortness of breath: Secondary | ICD-10-CM | POA: Diagnosis present

## 2016-03-01 DIAGNOSIS — J9622 Acute and chronic respiratory failure with hypercapnia: Secondary | ICD-10-CM | POA: Diagnosis present

## 2016-03-01 DIAGNOSIS — E870 Hyperosmolality and hypernatremia: Secondary | ICD-10-CM | POA: Diagnosis present

## 2016-03-01 DIAGNOSIS — E872 Acidosis: Secondary | ICD-10-CM | POA: Diagnosis present

## 2016-03-01 DIAGNOSIS — T380X5A Adverse effect of glucocorticoids and synthetic analogues, initial encounter: Secondary | ICD-10-CM | POA: Diagnosis present

## 2016-03-01 DIAGNOSIS — G9341 Metabolic encephalopathy: Secondary | ICD-10-CM | POA: Diagnosis present

## 2016-03-01 DIAGNOSIS — D638 Anemia in other chronic diseases classified elsewhere: Secondary | ICD-10-CM | POA: Diagnosis present

## 2016-03-01 DIAGNOSIS — I471 Supraventricular tachycardia: Secondary | ICD-10-CM | POA: Diagnosis present

## 2016-03-01 DIAGNOSIS — R400 Somnolence: Secondary | ICD-10-CM | POA: Diagnosis not present

## 2016-03-01 DIAGNOSIS — E860 Dehydration: Secondary | ICD-10-CM | POA: Diagnosis present

## 2016-03-01 DIAGNOSIS — K219 Gastro-esophageal reflux disease without esophagitis: Secondary | ICD-10-CM | POA: Diagnosis present

## 2016-03-01 DIAGNOSIS — R4182 Altered mental status, unspecified: Secondary | ICD-10-CM | POA: Diagnosis present

## 2016-03-01 DIAGNOSIS — J9602 Acute respiratory failure with hypercapnia: Secondary | ICD-10-CM | POA: Diagnosis present

## 2016-03-01 DIAGNOSIS — N179 Acute kidney failure, unspecified: Secondary | ICD-10-CM | POA: Diagnosis present

## 2016-03-01 DIAGNOSIS — R7989 Other specified abnormal findings of blood chemistry: Secondary | ICD-10-CM | POA: Diagnosis present

## 2016-03-01 DIAGNOSIS — Z87891 Personal history of nicotine dependence: Secondary | ICD-10-CM | POA: Diagnosis not present

## 2016-03-01 DIAGNOSIS — Z8249 Family history of ischemic heart disease and other diseases of the circulatory system: Secondary | ICD-10-CM | POA: Diagnosis not present

## 2016-03-01 DIAGNOSIS — N183 Chronic kidney disease, stage 3 (moderate): Secondary | ICD-10-CM | POA: Diagnosis present

## 2016-03-01 DIAGNOSIS — Z9981 Dependence on supplemental oxygen: Secondary | ICD-10-CM | POA: Diagnosis not present

## 2016-03-01 LAB — BLOOD GAS, ARTERIAL
ACID-BASE EXCESS: 11 mmol/L — AB (ref 0.0–2.0)
Bicarbonate: 39.9 mEq/L — ABNORMAL HIGH (ref 20.0–24.0)
DRAWN BY: 252031
EXPIRATORY PAP: 5
FIO2: 0.4
INSPIRATORY PAP: 14
LHR: 22 {breaths}/min
MODE: POSITIVE
O2 SAT: 98.3 %
PH ART: 7.283 — AB (ref 7.350–7.450)
Patient temperature: 98.6
TCO2: 37.9 mmol/L (ref 0–100)
pCO2 arterial: 87.2 mmHg (ref 35.0–45.0)
pO2, Arterial: 126 mmHg — ABNORMAL HIGH (ref 80.0–100.0)

## 2016-03-01 LAB — BASIC METABOLIC PANEL
Anion gap: 8 (ref 5–15)
BUN: 50 mg/dL — AB (ref 6–20)
CHLORIDE: 97 mmol/L — AB (ref 101–111)
CO2: 42 mmol/L — AB (ref 22–32)
CREATININE: 2.04 mg/dL — AB (ref 0.44–1.00)
Calcium: 10.2 mg/dL (ref 8.9–10.3)
GFR calc Af Amer: 24 mL/min — ABNORMAL LOW (ref >60)
GFR calc non Af Amer: 21 mL/min — ABNORMAL LOW (ref >60)
GLUCOSE: 132 mg/dL — AB (ref 65–99)
POTASSIUM: 5.6 mmol/L — AB (ref 3.5–5.1)
Sodium: 147 mmol/L — ABNORMAL HIGH (ref 135–145)

## 2016-03-01 LAB — POTASSIUM: Potassium: 4.9 mmol/L (ref 3.5–5.1)

## 2016-03-01 LAB — COMPREHENSIVE METABOLIC PANEL
ALBUMIN: 4 g/dL (ref 3.5–5.0)
ALK PHOS: 80 U/L (ref 38–126)
ALT: 11 U/L — AB (ref 14–54)
ANION GAP: 9 (ref 5–15)
AST: 17 U/L (ref 15–41)
BUN: 50 mg/dL — ABNORMAL HIGH (ref 6–20)
CALCIUM: 10.5 mg/dL — AB (ref 8.9–10.3)
CO2: 41 mmol/L — AB (ref 22–32)
CREATININE: 2.23 mg/dL — AB (ref 0.44–1.00)
Chloride: 97 mmol/L — ABNORMAL LOW (ref 101–111)
GFR calc Af Amer: 21 mL/min — ABNORMAL LOW (ref >60)
GFR calc non Af Amer: 19 mL/min — ABNORMAL LOW (ref >60)
GLUCOSE: 142 mg/dL — AB (ref 65–99)
Potassium: 5 mmol/L (ref 3.5–5.1)
SODIUM: 147 mmol/L — AB (ref 135–145)
Total Bilirubin: 0.2 mg/dL — ABNORMAL LOW (ref 0.3–1.2)
Total Protein: 7.6 g/dL (ref 6.5–8.1)

## 2016-03-01 LAB — BLOOD GAS, VENOUS
Acid-Base Excess: 11.9 mmol/L — ABNORMAL HIGH (ref 0.0–2.0)
BICARBONATE: 42.3 meq/L — AB (ref 20.0–24.0)
O2 Content: 2 L/min
O2 Saturation: 67.3 %
PH VEN: 7.222 — AB (ref 7.250–7.300)
Patient temperature: 99.1
TCO2: 41 mmol/L (ref 0–100)
pCO2, Ven: 107 mmHg (ref 45.0–50.0)
pO2, Ven: 41.4 mmHg (ref 31.0–45.0)

## 2016-03-01 LAB — PROCALCITONIN: Procalcitonin: 0.46 ng/mL

## 2016-03-01 LAB — TROPONIN I
TROPONIN I: 0.05 ng/mL — AB (ref <0.031)
Troponin I: 0.07 ng/mL — ABNORMAL HIGH (ref <0.031)
Troponin I: 0.06 ng/mL — ABNORMAL HIGH (ref <0.031)

## 2016-03-01 LAB — CBC
HCT: 35 % — ABNORMAL LOW (ref 36.0–46.0)
HEMOGLOBIN: 11.1 g/dL — AB (ref 12.0–15.0)
MCH: 30.4 pg (ref 26.0–34.0)
MCHC: 31.7 g/dL (ref 30.0–36.0)
MCV: 95.9 fL (ref 78.0–100.0)
Platelets: 233 10*3/uL (ref 150–400)
RBC: 3.65 MIL/uL — AB (ref 3.87–5.11)
RDW: 14.1 % (ref 11.5–15.5)
WBC: 11.2 10*3/uL — ABNORMAL HIGH (ref 4.0–10.5)

## 2016-03-01 LAB — MRSA PCR SCREENING: MRSA BY PCR: NEGATIVE

## 2016-03-01 LAB — BRAIN NATRIURETIC PEPTIDE: B Natriuretic Peptide: 466.8 pg/mL — ABNORMAL HIGH (ref 0.0–100.0)

## 2016-03-01 MED ORDER — FLUTICASONE PROPIONATE 50 MCG/ACT NA SUSP
2.0000 | Freq: Every day | NASAL | Status: DC
  Administered 2016-03-01 – 2016-03-04 (×4): 2 via NASAL
  Filled 2016-03-01: qty 16

## 2016-03-01 MED ORDER — PANTOPRAZOLE SODIUM 40 MG PO TBEC
40.0000 mg | DELAYED_RELEASE_TABLET | Freq: Every day | ORAL | Status: DC
  Administered 2016-03-01 – 2016-03-04 (×4): 40 mg via ORAL
  Filled 2016-03-01 (×5): qty 1

## 2016-03-01 MED ORDER — CHLORHEXIDINE GLUCONATE 0.12 % MT SOLN
15.0000 mL | Freq: Two times a day (BID) | OROMUCOSAL | Status: DC
  Administered 2016-03-01 – 2016-03-04 (×8): 15 mL via OROMUCOSAL
  Filled 2016-03-01 (×6): qty 15

## 2016-03-01 MED ORDER — METOPROLOL TARTRATE 1 MG/ML IV SOLN
2.5000 mg | Freq: Four times a day (QID) | INTRAVENOUS | Status: DC
  Administered 2016-03-01 – 2016-03-02 (×6): 2.5 mg via INTRAVENOUS
  Filled 2016-03-01 (×6): qty 5

## 2016-03-01 MED ORDER — ONDANSETRON HCL 4 MG/2ML IJ SOLN: 4.0000 mg | Freq: Four times a day (QID) | INTRAMUSCULAR | Status: DC | PRN

## 2016-03-01 MED ORDER — LACTATED RINGERS IV BOLUS (SEPSIS)
1000.0000 mL | Freq: Once | INTRAVENOUS | Status: AC
  Administered 2016-03-01: 1000 mL via INTRAVENOUS

## 2016-03-01 MED ORDER — ARFORMOTEROL TARTRATE 15 MCG/2ML IN NEBU
15.0000 ug | INHALATION_SOLUTION | Freq: Two times a day (BID) | RESPIRATORY_TRACT | Status: DC
  Administered 2016-03-01 – 2016-03-04 (×7): 15 ug via RESPIRATORY_TRACT
  Filled 2016-03-01 (×7): qty 2

## 2016-03-01 MED ORDER — LEVOFLOXACIN IN D5W 500 MG/100ML IV SOLN
500.0000 mg | Freq: Once | INTRAVENOUS | Status: AC
  Administered 2016-03-01: 500 mg via INTRAVENOUS
  Filled 2016-03-01: qty 100

## 2016-03-01 MED ORDER — ALBUTEROL SULFATE (2.5 MG/3ML) 0.083% IN NEBU
2.5000 mg | INHALATION_SOLUTION | Freq: Four times a day (QID) | RESPIRATORY_TRACT | Status: DC
Start: 2016-03-01 — End: 2016-03-01
  Administered 2016-03-01: 2.5 mg via RESPIRATORY_TRACT
  Filled 2016-03-01: qty 3

## 2016-03-01 MED ORDER — LEVOFLOXACIN IN D5W 500 MG/100ML IV SOLN: 500.0000 mg | INTRAVENOUS | Status: DC

## 2016-03-01 MED ORDER — ALBUTEROL SULFATE (2.5 MG/3ML) 0.083% IN NEBU: 2.5000 mg | INHALATION_SOLUTION | RESPIRATORY_TRACT | Status: DC | PRN

## 2016-03-01 MED ORDER — METHYLPREDNISOLONE SODIUM SUCC 40 MG IJ SOLR
40.0000 mg | Freq: Two times a day (BID) | INTRAMUSCULAR | Status: DC
  Administered 2016-03-01: 40 mg via INTRAVENOUS
  Filled 2016-03-01: qty 1

## 2016-03-01 MED ORDER — ONDANSETRON HCL 4 MG PO TABS: 4.0000 mg | ORAL_TABLET | Freq: Four times a day (QID) | ORAL | Status: DC | PRN

## 2016-03-01 MED ORDER — DEXTROSE-NACL 5-0.45 % IV SOLN
INTRAVENOUS | Status: DC
  Administered 2016-03-01: 1000 mL via INTRAVENOUS
  Administered 2016-03-01 – 2016-03-02 (×2): via INTRAVENOUS

## 2016-03-01 MED ORDER — ACETAMINOPHEN 325 MG PO TABS: 650.0000 mg | ORAL_TABLET | Freq: Four times a day (QID) | ORAL | Status: DC | PRN

## 2016-03-01 MED ORDER — LORATADINE 10 MG PO TABS
5.0000 mg | ORAL_TABLET | Freq: Every day | ORAL | Status: DC
  Administered 2016-03-01 – 2016-03-04 (×4): 5 mg via ORAL
  Filled 2016-03-01: qty 1
  Filled 2016-03-01: qty 0.5
  Filled 2016-03-01: qty 1
  Filled 2016-03-01: qty 0.5

## 2016-03-01 MED ORDER — LORATADINE 10 MG PO TABS: 10.0000 mg | ORAL_TABLET | Freq: Every day | ORAL | Status: DC

## 2016-03-01 MED ORDER — DEXTROSE-NACL 5-0.45 % IV SOLN
INTRAVENOUS | Status: DC
  Administered 2016-03-01: 08:00:00 via INTRAVENOUS
  Filled 2016-03-01: qty 1000

## 2016-03-01 MED ORDER — SODIUM CHLORIDE 0.9% FLUSH
3.0000 mL | Freq: Two times a day (BID) | INTRAVENOUS | Status: DC
  Administered 2016-03-01 – 2016-03-04 (×4): 3 mL via INTRAVENOUS

## 2016-03-01 MED ORDER — IPRATROPIUM BROMIDE 0.02 % IN SOLN
0.5000 mg | Freq: Four times a day (QID) | RESPIRATORY_TRACT | Status: DC
  Administered 2016-03-01: 0.5 mg via RESPIRATORY_TRACT
  Filled 2016-03-01: qty 2.5

## 2016-03-01 MED ORDER — LEVOFLOXACIN IN D5W 250 MG/50ML IV SOLN
250.0000 mg | INTRAVENOUS | Status: DC
  Administered 2016-03-03: 250 mg via INTRAVENOUS
  Filled 2016-03-01: qty 50

## 2016-03-01 MED ORDER — IPRATROPIUM-ALBUTEROL 0.5-2.5 (3) MG/3ML IN SOLN
3.0000 mL | Freq: Four times a day (QID) | RESPIRATORY_TRACT | Status: DC
  Administered 2016-03-01 – 2016-03-02 (×5): 3 mL via RESPIRATORY_TRACT
  Filled 2016-03-01 (×5): qty 3

## 2016-03-01 MED ORDER — ENOXAPARIN SODIUM 30 MG/0.3ML ~~LOC~~ SOLN
30.0000 mg | SUBCUTANEOUS | Status: DC
  Administered 2016-03-01 – 2016-03-04 (×4): 30 mg via SUBCUTANEOUS
  Filled 2016-03-01 (×4): qty 0.3

## 2016-03-01 MED ORDER — CETYLPYRIDINIUM CHLORIDE 0.05 % MT LIQD
7.0000 mL | Freq: Two times a day (BID) | OROMUCOSAL | Status: DC
  Administered 2016-03-01 – 2016-03-03 (×4): 7 mL via OROMUCOSAL

## 2016-03-01 MED ORDER — IPRATROPIUM-ALBUTEROL 0.5-2.5 (3) MG/3ML IN SOLN: 3.0000 mL | RESPIRATORY_TRACT | Status: DC | PRN

## 2016-03-01 MED ORDER — DEXTROSE-NACL 5-0.45 % IV SOLN: INTRAVENOUS | Status: DC

## 2016-03-01 MED ORDER — BUDESONIDE 0.25 MG/2ML IN SUSP
0.2500 mg | Freq: Two times a day (BID) | RESPIRATORY_TRACT | Status: DC
  Administered 2016-03-01 – 2016-03-04 (×7): 0.25 mg via RESPIRATORY_TRACT
  Filled 2016-03-01 (×7): qty 2

## 2016-03-01 MED ORDER — METHYLPREDNISOLONE SODIUM SUCC 125 MG IJ SOLR
80.0000 mg | Freq: Four times a day (QID) | INTRAMUSCULAR | Status: DC
  Administered 2016-03-01 – 2016-03-02 (×5): 80 mg via INTRAVENOUS
  Filled 2016-03-01 (×5): qty 2

## 2016-03-01 MED ORDER — ACETAMINOPHEN 650 MG RE SUPP: 650.0000 mg | Freq: Four times a day (QID) | RECTAL | Status: DC | PRN

## 2016-03-01 MED ORDER — LEVOFLOXACIN IN D5W 250 MG/50ML IV SOLN
250.0000 mg | INTRAVENOUS | Status: DC
  Filled 2016-03-01: qty 50

## 2016-03-01 MED ORDER — SODIUM CHLORIDE 0.9 % IV SOLN
INTRAVENOUS | Status: DC
  Administered 2016-03-01: 03:00:00 via INTRAVENOUS

## 2016-03-01 NOTE — NC FL2 (Signed)
[place] MEDICAID FL2 LEVEL OF CARE SCREENING TOOL     IDENTIFICATION  Patient Name: Andrea Weber Birthdate: 1925/11/30 Sex: female Admission Date (Current Location): 02/29/2016  [facility] [place]   Facility and Address[facility]  [facility],  [address], [place] [id number]      Provider Number: [id number]  Attending Physician Name and Address:  Rodolph Bonganiel Thompson V, MD  Relative Name and Phone Number:       Current Level of Care: Hospital Recommended Level of Care: Skilled Nursing Facility Prior Approval Number:    Date Approved/Denied:   PASRR Number:    Discharge Plan: SNF    Current Diagnoses: Patient Active Problem List   Diagnosis Date Noted  . Altered mental status 03/01/2016  . Acute on chronic respiratory failure with hypercapnia (HCC) 03/01/2016  . Elevated serum creatinine 03/01/2016  . Hypernatremia 03/01/2016  . Acute respiratory failure with hypercapnia (HCC) 03/01/2016  . COPD exacerbation (HCC) 03/01/2016  . Hyperkalemia 03/01/2016  . Bilateral lower extremity edema 09/10/2015  . Chronic respiratory failure with hypercapnia (HCC) 09/03/2015  . GERD without esophagitis 09/03/2015  . Allergic rhinitis due to allergen 09/03/2015  . Anemia of chronic disease 09/03/2015  . COPD (chronic obstructive pulmonary disease) (HCC) 07/07/2015  . CKD (chronic kidney disease) stage 3, GFR 30-59 ml/min 07/07/2015  . Multifocal atrial tachycardia (HCC) 07/07/2015    Orientation RESPIRATION BLADDER Height & Weight     Self, Time, Situation, Place  O2, Other (Comment) (Bipap) Incontinent Weight: 113 lb 1.5 oz (51.3 kg) Height:  5\' 1"  (154.9 cm)  BEHAVIORAL SYMPTOMS/MOOD NEUROLOGICAL BOWEL NUTRITION STATUS      Continent    AMBULATORY STATUS COMMUNICATION OF NEEDS Skin   Extensive Assist Verbally                         Personal Care Assistance Level of Assistance  Bathing, Feeding, Dressing Bathing Assistance:  Maximum assistance Feeding assistance: Maximum assistance Dressing Assistance: Maximum assistance     Functional Limitations Info             SPECIAL CARE FACTORS FREQUENCY  PT (By licensed PT), OT (By licensed OT)                    Contractures Contractures Info: Not present    Additional Factors Info                  Current Medications (03/01/2016):  This is the current hospital active medication list Current Facility-Administered Medications  Medication Dose Route Frequency Provider Last Rate Last Dose  . acetaminophen (TYLENOL) tablet 650 mg  650 mg Oral Q6H PRN Alberteen Samhristopher P Danford, MD       Or  . acetaminophen (TYLENOL) suppository 650 mg  650 mg Rectal Q6H PRN Alberteen Samhristopher P Danford, MD      . antiseptic oral rinse (CPC / CETYLPYRIDINIUM CHLORIDE 0.05%) solution 7 mL  7 mL Mouth Rinse q12n4p Alberteen Samhristopher P Danford, MD   7 mL at 03/01/16 1200  . arformoterol (BROVANA) nebulizer solution 15 mcg  15 mcg Nebulization BID Rodolph Bonganiel Thompson V, MD   15 mcg at 03/01/16 0941  . budesonide (PULMICORT) nebulizer solution 0.25 mg  0.25 mg Nebulization BID Rodolph Bonganiel Thompson V, MD   0.25 mg at 03/01/16 0940  . chlorhexidine (PERIDEX) 0.12 % solution 15 mL  15 mL Mouth Rinse BID Alberteen Samhristopher P Danford, MD   15 mL at 03/01/16  1610  . dextrose 5 %-0.45 % sodium chloride infusion   Intravenous Continuous Rodolph Bong, MD 75 mL/hr at 03/01/16 0800 1,000 mL at 03/01/16 0800  . enoxaparin (LOVENOX) injection 30 mg  30 mg Subcutaneous Q24H Alberteen Sam, MD   30 mg at 03/01/16 0941  . fluticasone (FLONASE) 50 MCG/ACT nasal spray 2 spray  2 spray Each Nare Daily Rodolph Bong, MD   2 spray at 03/01/16 1123  . ipratropium-albuterol (DUONEB) 0.5-2.5 (3) MG/3ML nebulizer solution 3 mL  3 mL Nebulization Q6H Rodolph Bong, MD   3 mL at 03/01/16 0925  . ipratropium-albuterol (DUONEB) 0.5-2.5 (3) MG/3ML nebulizer solution 3 mL  3 mL Nebulization Q2H PRN Rodolph Bong, MD       . Melene Muller ON 03/03/2016] Levofloxacin (LEVAQUIN) IVPB 250 mg  250 mg Intravenous Q48H Rollene Fare, RPH      . loratadine (CLARITIN) tablet 5 mg  5 mg Oral Daily Rodolph Bong, MD   5 mg at 03/01/16 0942  . methylPREDNISolone sodium succinate (SOLU-MEDROL) 125 mg/2 mL injection 80 mg  80 mg Intravenous Q6H Rodolph Bong, MD   80 mg at 03/01/16 1412  . metoprolol (LOPRESSOR) injection 2.5 mg  2.5 mg Intravenous Q6H Alberteen Sam, MD   2.5 mg at 03/01/16 1413  . ondansetron (ZOFRAN) tablet 4 mg  4 mg Oral Q6H PRN Alberteen Sam, MD       Or  . ondansetron (ZOFRAN) injection 4 mg  4 mg Intravenous Q6H PRN Alberteen Sam, MD      . pantoprazole (PROTONIX) EC tablet 40 mg  40 mg Oral Q0600 Rodolph Bong, MD   40 mg at 03/01/16 0946  . sodium chloride flush (NS) 0.9 % injection 3 mL  3 mL Intravenous Q12H Alberteen Sam, MD   3 mL at 03/01/16 9604     Discharge Medications: Please see discharge summary for a list of discharge medications.  Relevant Imaging Results:  Relevant Lab Results:   Additional Information    Deatra Robinson, [place]

## 2016-03-01 NOTE — Progress Notes (Signed)
Pharmacy Antibiotic Note  Andrea Weber is a 82 y.o. female admitted on 02/29/2016 with altered mental status.  Pharmacy has been consulted to adjust antibiotic dose based on patient's renal function.  PTA patient on Avelox 400mg  daily x 7 days for CAP, which was started on 4/23.  Patient received doses on 4/23 & 4/24.  Upon admission pt prescribed Levaquin 500mg  q24h for total of 5 days.  Of note, 500mg  dose on 4/24 was not given.  CrCl = 15 ml/min, which requires dose adjustment of Levaquin  Plan:  Levofloxacin 500mg  IV x 1 today, then 250mg  IV q48h x 2 doses (total of 5 days of antibiotics as ordered by MD)  Follow renal function and cultures  Height: 5\' 1"  (154.9 cm) Weight: 113 lb 1.5 oz (51.3 kg) IBW/kg (Calculated) : 47.8  Temp (24hrs), Avg:97.9 F (36.6 C), Min:97.3 F (36.3 C), Max:99.1 F (37.3 C)   Recent Labs Lab 02/29/16 2341 02/29/16 2347 03/01/16 0327  WBC 11.4*  --  11.2*  CREATININE 2.23*  --  2.04*  LATICACIDVEN  --  0.70  --     Estimated Creatinine Clearance: 14.4 mL/min (by C-G formula based on Cr of 2.04).    No Known Allergies  Antimicrobials this admission: PTA Avelox started 4/23 4/25 >> Levaquin >> 4/29  Microbiology results: 4/24 UCx: sent  4/25 MRSA PCR: neg  Thank you for allowing pharmacy to be a part of this patient's care.  Loralee PacasErin Talley Kreiser, PharmD, BCPS Pager: [phone]  03/01/2016 10:16 AM

## 2016-03-01 NOTE — Care Management Note (Signed)
Case Management Note  Patient Details  Name: Andrea Weber MRN: [id number] Date of Birth: 08/19/1926  Subjective/Objective:       Copd and bipap             Action/Plan:Date:  March 01, 2016 Chart reviewed for concurrent status and case management needs. Will continue to follow patient for changes and needs: Marcelle Smilinghonda Spyros Winch, BSN, RN, [place]   [phone]   Expected Discharge Date:   (unknown)               Expected Discharge Plan:  Home/Self Care  In-House Referral:  NA  Discharge planning Services  CM Consult  Post Acute Care Choice:  NA Choice offered to:  NA  DME Arranged:    DME Agency:     HH Arranged:    HH Agency:     Status of Service:  In process, will continue to follow  Medicare Important Message Given:    Date Medicare IM Given:    Medicare IM give by:    Date Additional Medicare IM Given:    Additional Medicare Important Message give by:     If discussed at Long Length of Stay Meetings, dates discussed:    Additional Comments:  Golda AcreDavis, Hillari Zumwalt Lynn, RN 03/01/2016, 9:43 AM

## 2016-03-01 NOTE — Progress Notes (Signed)
Pt has BiPAP qhs orders.  Discussed with Pt while providing nebulizer treatments.  Pt agreed to go on BiPAP at about 23:00, after RN had provided evening medications.  RT will continue to monitor as needed.

## 2016-03-01 NOTE — Progress Notes (Signed)
Pt confused, agitated, unable to reorient, pulling off bipap, Sats 83% on room air, Not able to obtain Chest x-ray, labs or ABG due to agitation, hitting, screaming.  Bilateral wrist restraints applied, Dr Janee Mornhompson notified, O2 placed on at 3 liters, sats now 94% . No family available.

## 2016-03-01 NOTE — H&P (Signed)
History and Physical  Patient Name: Andrea Weber     ACZ:[id number]    DOB: August 30, 1926    DOA: 02/29/2016 Referring provider: Frances Nickels, MD PCP: Hennie Duos, MD  Outpatient specialists: None Patient coming from: [facility]  Chief Complaint: AMS  HPI: Andrea Weber is a 82 y.o. female with a past medical history significant for COPD on nocturnal O2/BiPAP, anemia, and CKD who presents with altered mental status.  Patient unable to provide meaningful history and per ED, reports from nursing home conflicting and unreliable.  Hx collected from chart review, NH medication guide and EMS via EDP.  Patient was brought in by EMS from her nursing home for altered mental status with disorientation, decreased responsiveness, and sluggishness.  Per geriatrician notes, she is usually A&Ox3.  Per MAR, she was started on Duo-nebs, steroids, and Avelox two days ago, presumably for COPD flare, but no reports of this percolated to [place] from NH.  Today, she appears not to have been able to take them, and so IV forms were ordered (per [facility]), but an IV couldn't be started (or was placed and then she pulled it out?), and so she was sent to the ER.  In the ED, she was initially oriented intermittently, but sluggish.  Tachycardic, afebrile, saturating well on home O2, BP normal.  Na 147, K 5.0, HCO341, Cr 2.23 (eGFR 21 from baseline 30), Hgb 11.2, WBC 11.4K, lactate normal.  A venous blood gas showed acidosis with vpH 7.22 and pvCO2 107.  BiPAP was started, fluids were administered and TRH were asked to evaluate for admission.  She had a similar presentation last August, was admitted for 7 days for altered mental status ultimately felt to be due to AKI and hypercapnia from COPD flare.  It looks like she was treated with steroids and bronchodilators and discharged to SNF, but has been there ever since. It also looks like she was discharged with plans for a PSG, I don't see records that this was ever  done, but the patient has been on nocturnal BiPAP 12/5 with 3 L bleed in nightly since then.     Review of Systems:  Unable to obtain due to patient mentation.    Past Medical History  Diagnosis Date  . Subarachnoid hemorrhage ([place])   . Asthma   . Hypertension   . COPD (chronic obstructive pulmonary disease) ([place])     On home O2  . Chronic kidney disease   . Anemia   . Dysrhythmia     Past Surgical History  Procedure Laterality Date  . Lung surgery    . Abdominal hysterectomy      Social History: Patient lives at [facility] in long-term care per geriatrician notes.  Former smoker.   Baseline physical function unknonw.  No Known Allergies  Family history: family history includes Hypertension in her father and mother.   Further hx unable to obtain.  Prior to Admission medications   Medication Sig Start Date End Date Taking? Authorizing Provider  albuterol (ACCUNEB) 0.63 MG/3ML nebulizer solution Take 1 ampule by nebulization every 4 (four) hours as needed for wheezing.   Yes Historical Provider, MD  albuterol (PROVENTIL HFA;VENTOLIN HFA) 108 (90 BASE) MCG/ACT inhaler Inhale 2 puffs into the lungs every 6 (six) hours as needed for wheezing or shortness of breath.   Yes Historical Provider, MD  cholecalciferol (VITAMIN D) 1000 UNITS tablet Take 1,000 Units by mouth daily.   Yes Historical Provider, MD  diltiazem (CARDIZEM)  30 MG tablet Take 1 tablet (30 mg total) by mouth every 8 (eight) hours. 07/02/15  Yes Costin Karlyne Greenspan, MD  diphenhydrAMINE (BENADRYL) 25 mg capsule Take 1 capsule (25 mg total) by mouth every 8 (eight) hours as needed for itching. 01/28/14  Yes Calvert Cantor, MD  famotidine (PEPCID) 20 MG tablet Take 1 tablet (20 mg total) by mouth daily. 01/28/14  Yes Calvert Cantor, MD  ferrous sulfate 325 (65 FE) MG tablet Take 325 mg by mouth daily with breakfast.   Yes Historical Provider, MD  Fluticasone-Salmeterol (ADVAIR) 250-50 MCG/DOSE AEPB Inhale 1 puff into  the lungs 2 (two) times daily.   Yes Historical Provider, MD  furosemide (LASIX) 20 MG tablet Take 1 tablet (20 mg total) by mouth daily. 11/14/15  Yes Gerlene Fee, NP  guaiFENesin (MUCINEX) 600 MG 12 hr tablet Take 600 mg by mouth 2 (two) times daily. For 7 days   Yes Historical Provider, MD  ipratropium-albuterol (DUONEB) 0.5-2.5 (3) MG/3ML SOLN Take 3 mLs by nebulization every 6 (six) hours as needed (congestion).   Yes Historical Provider, MD  moxifloxacin (AVELOX) 400 MG tablet Take 400 mg by mouth daily at 8 pm. For 7 days   Yes Historical Provider, MD  Nutritional Supplements (TWOCAL HN) LIQD Take 237 mLs by mouth 3 (three) times daily.   Yes Historical Provider, MD       Physical Exam: BP 114/65 mmHg  Pulse 89  Temp(Src) 99.1 F (37.3 C) (Rectal)  Resp 16  SpO2 100% General appearance: Thin elderly  female, somnolent, responds to touch, sluggish, follows simple commands, unable to make verbal replies.  Not oriented. Eyes: Anicteric, conjunctiva pink, lids and lashes normal.     ENT: No nasal deformity, discharge, or epistaxis.  MM dry.   Lymph: No cervical or supraclavicular lymphadenopathy. Skin: Warm and dry.  No suspicious rashes or lesions. Cardiac: Tachycardic, irregular, nl S1-S2, no murmurs appreciated.  Capillary refill is brisk.  JVP difficult to visualize with BiPAP, but EJs visible.  No LE edema.  Radial and DP pulses 2+ and symmetric. Respiratory: Shallow respirations, poor inspiratory effort.  No wheezes appreciated, but some coarse breath sounds at right base. Abdomen: Abdomen soft without rigidity.  No TTP. No ascites, distension.  No HSM. MSK: No deformities or effusions. Neuro: Follows commands weakly.  Arms weak but symmetric, legs movements symmetric, very weak.  Slowly follows commands, but unable to answer orientation questions or make anything but groans.    Psych: Unable to assess.       Labs on Admission:  I have personally reviewed the following  studies: The metabolic panel shows Hypernatremia, elevated bicarbonate, elevated BUN to creatinine ratio, elevated creatinine. Transaminases and bilirubin are normal. Urinalysis shows rare bacteria, few RBCs, hyalin casts. Unchanged from previous. Lactate normal. VBG shows pH 7.22, PCO2 107. The complete blood count shows chronic stable normocytic anemia, no leukocytosis or thrombocytopenia.   Radiological Exams on Admission: Personally reviewed: Dg Chest Port 1 View 02/29/2016  Edema versus scarring, unchanged from previous in January and last August.    Ct Head Wo Contrast 03/01/2016  IMPRESSION: No acute finding.  Stable senescent changes. Electronically Signed   By: Monte Fantasia M.D.   On: 03/01/2016 00:01     EKG: Independently reviewed. Rate 88, P axis appears mostly normal to me, occasional PACs.  QTc 466.  No ischemic changes.    Assessment/Plan 1. Encephalopathy, likely metabolic from acute on chronic respiratory failure with hypercapnia:  Suspect  COPD flare.  No report of nocturnal BiPAP non-compliance.  Likely encephalopathy from dehydration exacerbating hypercapnia.  Pneumonia not possible to rule out with poor quality CXR.  CHF doubted, but CXR suggests edema.   -Admit to SDU, continue BiPAP and repeat ABG in 4 hours -Solumedrol 40 mg IV BID -Albuterol-ipratropium q6hrs -Albuterol PRN -Check procalcitonin -Continue home Levaquin (started 4/23) for total 5 days -IVF -Check BNP   2. Hypernatremia:  -IVF and trend BMP  3. Elevated creatinine in CKD stage III:  Suspect dehydration without AoCKI. -Fluids and trend BMP  4. Anemia of chronic disease:  Somewhat hemoconcentrated  5. Abnormal chest x-ray:  Poor quality 1V CXR suggests edema and effusion. -Follow up with 2V CXR when mentating more clearly  6. Multifocal atrial tachyarrhythmia:  -Hold home orals -Metoprolol 2.5 mg IV q6hrs until able to restart oral medications      DVT prophylaxis: Low  dose Lovenox  Code Status: FULL (clearly indicated in NH paperwork, family unavailable for clarification)  Family Communication: None present  Disposition Plan: Anticipate SDU admission for BiPAP.  Trend ABG, steroids and bronchodilators.  Rule out PNA, or finish five days of ABx.  Medical decision making and consults: Patient seen at 1:08 AM on 03/01/2016.  The patient was discussed with Dr. Laneta Simmers. I recommend admission to SDU admission, inpatient status.  Clinical condition: requiring NIV at present, still altered, infection doubted, but covering empirically, continues to be confused.      Edwin Dada [facility] Pager [phone]

## 2016-03-01 NOTE — ED Notes (Signed)
Report given to 2W

## 2016-03-01 NOTE — Progress Notes (Addendum)
PROGRESS NOTE    Andrea Weber  ZOX:[id number] DOB: 01/12/1926 DOA: 02/29/2016 PCP: Margit Hanks, MD Outpatient Specialists:    Assessment & Plan:   Principal Problem:   Acute on chronic respiratory failure with hypercapnia (HCC) Active Problems:   COPD exacerbation (HCC)   COPD (chronic obstructive pulmonary disease) (HCC)   CKD (chronic kidney disease) stage 3, GFR 30-59 ml/min   Multifocal atrial tachycardia (HCC)   GERD without esophagitis   Anemia of chronic disease   Altered mental status   Elevated serum creatinine   Hypernatremia   Acute respiratory failure with hypercapnia (HCC)   Hyperkalemia  #1 acute on chronic respiratory failure with hypercapnia secondary to acute COPD exacerbation Patient with clinical improvement. Patient alert oriented to self and place and time and following commands appropriately on my examination however early on this morning patient was noted to be very agitated. Patient currently on 3 L nasal cannula with sats of 100%. Continue oxygen. Change IV Solu-Medrol to 80 mg IV every 6 hours and taper, change nebs to do a nebs every 6 hours and every 2 hours as scheduled. DC ABG this morning. Continue IV Levaquin. Placed on Pulmicort and Brovana nebs, Claritin, Flonase, PPI. BiPAP as needed. BiPAP daily at bedtime. Repeat chest x-ray.  #2 multifocal atrial tachycardia Currently rate controlled. Continue IV Lopressor and if continued clinical improvement could transition to home dose oral Cardizem. I F   #3 hypernatremia Likely secondary to volume depletion. Patient looks clinically dry on examination and as such will continue to hold diuretics. Change IV fluids to D5 half-normal saline at 75 mL an hour. Follow.  #4 acute metabolic encephalopathy Likely secondary to problem #1. Clinical improvement. See #1.  #5 anemia of chronic disease H&H stable.  #6 elevated creatinine in chronic kidney disease stage III Likely secondary to  dehydration. Improving with gentle hydration. Follow.  #7 abnormal chest x-ray 12 quality chest x-ray suggesting edema and effusion however patient looks clinically dry on examination and no crackles noted on exam. Monitor closely with gentle hydration. When mentating better may consider two-view chest x-ray in the next few days.  #8 leukocytosis Likely secondary to steroids. Chest x-ray negative for any acute infiltrate. Repeat chest x-ray pending. Urinalysis is unremarkable. Patient currently afebrile. Patient on IV Levaquin secondary to COPD exacerbation. Follow.  #9 gastroesophageal reflux disease PPI.  #10 hyperkalemia Repeat potassium levels. If potassium is still elevated will give an amp of calcium gluconate, D50, insulin, Kayexalate. Will also check a EKG if elevated.  DVT prophylaxis: Lovenox Code Status: Full Family Communication: Updated patient. No family at bedside. Disposition Plan: Remain in the stepdown unit.    Consultants:   None  Procedures:   CT head without contrast 02/29/2016  Chest x-ray 02/29/2016    Antimicrobials:   IV Levaquin for 25 2017   Subjective: Patient noted to be very agitated this morning refusing labs, chest x-ray, hitting and screaming. At time of my evaluation patient was calm sleeping easily arousable alert to self and place and time and following commands appropriately.  Objective: Filed Vitals:   03/01/16 0700 03/01/16 0758 03/01/16 0800 03/01/16 0900  BP: 102/53  134/80 140/71  Pulse:      Temp:  97.9 F (36.6 C)    TempSrc:  Axillary    Resp: Height:      Weight:      SpO2: 100%  100% 100%    Intake/Output Summary (Last 24 hours)  at 03/01/16 0941 Last data filed at 03/01/16 0900  Gross per 24 hour  Intake 1317.5 ml  Output      0 ml  Net 1317.5 ml   Filed Weights   03/01/16 0203  Weight: 51.3 kg (113 lb 1.5 oz)    Examination:  General exam: Appears calm and comfortable. Sleeping, easily  arousable. Respiratory system: Decreased breath sounds in the bases. Poor to fair air movement. Minimal wheezing. Cardiovascular system: S1 & S2 heard, RRR. No JVD, murmurs, rubs, gallops or clicks. No pedal edema. Gastrointestinal system: Abdomen is nondistended, soft and nontender. No organomegaly or masses felt. Normal bowel sounds heard. Central nervous system: Alert and oriented. No focal neurological deficits. Extremities: Symmetric 5 x 5 power. Skin: No rashes, lesions or ulcers Psychiatry: Judgement and insight appear normal. Mood & affect appropriate.     Data Reviewed: I have personally reviewed following labs and imaging studies  CBC:  Recent Labs Lab 02/29/16 2341 03/01/16 0327  WBC 11.4* 11.2*  NEUTROABS 10.6*  --   HGB 11.2* 11.1*  HCT 35.7* 35.0*  MCV 95.7 95.9  PLT 228 233   Basic Metabolic Panel:  Recent Labs Lab 02/29/16 2341 03/01/16 0327  NA 147* 147*  K 5.0 5.6*  CL 97* 97*  CO2 41* 42*  GLUCOSE 142* 132*  BUN 50* 50*  CREATININE 2.23* 2.04*  CALCIUM 10.5* 10.2   GFR: Estimated Creatinine Clearance: 14.4 mL/min (by C-G formula based on Cr of 2.04). Liver Function Tests:  Recent Labs Lab 02/29/16 2341  AST 17  ALT 11*  ALKPHOS 80  BILITOT 0.2*  PROT 7.6  ALBUMIN 4.0   No results for input(s): LIPASE, AMYLASE in the last 168 hours. No results for input(s): AMMONIA in the last 168 hours. Coagulation Profile: No results for input(s): INR, PROTIME in the last 168 hours. Cardiac Enzymes: No results for input(s): CKTOTAL, CKMB, CKMBINDEX, TROPONINI in the last 168 hours. BNP (last 3 results) No results for input(s): PROBNP in the last 8760 hours. HbA1C: No results for input(s): HGBA1C in the last 72 hours. CBG:  Recent Labs Lab 02/29/16 2158  GLUCAP 126*   Lipid Profile: No results for input(s): CHOL, HDL, LDLCALC, TRIG, CHOLHDL, LDLDIRECT in the last 72 hours. Thyroid Function Tests: No results for input(s): TSH, T4TOTAL,  FREET4, T3FREE, THYROIDAB in the last 72 hours. Anemia Panel: No results for input(s): VITAMINB12, FOLATE, FERRITIN, TIBC, IRON, RETICCTPCT in the last 72 hours. Urine analysis:    Component Value Date/Time   COLORURINE YELLOW 02/29/2016 2230   APPEARANCEUR CLOUDY* 02/29/2016 2230   LABSPEC 1.014 02/29/2016 2230   PHURINE 5.0 02/29/2016 2230   GLUCOSEU NEGATIVE 02/29/2016 2230   HGBUR TRACE* 02/29/2016 2230   BILIRUBINUR NEGATIVE 02/29/2016 2230   KETONESUR NEGATIVE 02/29/2016 2230   PROTEINUR 30* 02/29/2016 2230   UROBILINOGEN 0.2 06/26/2015 1055   NITRITE NEGATIVE 02/29/2016 2230   LEUKOCYTESUR NEGATIVE 02/29/2016 2230   Sepsis Labs: @LABRCNTIP (procalcitonin:4,lacticidven:4)  ) Recent Results (from the past 240 hour(s))  MRSA PCR Screening     Status: None   Collection Time: 03/01/16  2:25 AM  Result Value Ref Range Status   MRSA by PCR NEGATIVE NEGATIVE Final    Comment:        The GeneXpert MRSA Assay (FDA approved for NASAL specimens only), is one component of a comprehensive MRSA colonization surveillance program. It is not intended to diagnose MRSA infection nor to guide or monitor treatment for MRSA infections.  Radiology Studies: Ct Head Wo Contrast  03/01/2016  CLINICAL DATA:  Altered mental status EXAM: CT HEAD WITHOUT CONTRAST TECHNIQUE: Contiguous axial images were obtained from the base of the skull through the vertex without intravenous contrast. COMPARISON:  06/26/2015 FINDINGS: Skull and Sinuses:Negative for acute fracture or destructive process. No sinus or mastoid fluid levels. Visualized orbits: Remote blowout fracture of the medial wall left orbit. Brain: No evidence of acute infarction, hemorrhage, hydrocephalus, or mass lesion/mass effect. Chronic small vessel disease with stable pattern since 2016. Ischemic gliosis is confluent in the deep cerebral white matter. IMPRESSION: No acute finding.  Stable senescent changes. Electronically  Signed   By: Marnee Spring M.D.   On: 03/01/2016 00:01   Dg Chest Port 1 View  02/29/2016  CLINICAL DATA:  Acute onset of shortness of breath and altered mental status. Initial encounter. EXAM: PORTABLE CHEST 1 VIEW COMPARISON:  Chest radiograph performed 11/15/2015 FINDINGS: The lungs are well-aerated. A small left pleural effusion is noted. Vascular congestion is seen. Increased interstitial markings may reflect mild interstitial edema. There is no evidence of pneumothorax. The cardiomediastinal silhouette is mildly enlarged. No acute osseous abnormalities are seen. IMPRESSION: Small left pleural effusion noted. Vascular congestion and mild cardiomegaly. Increased interstitial markings may reflect mild interstitial edema. Electronically Signed   By: Roanna Raider M.D.   On: 02/29/2016 22:26        Scheduled Meds: . antiseptic oral rinse  7 mL Mouth Rinse q12n4p  . arformoterol  15 mcg Nebulization BID  . budesonide (PULMICORT) nebulizer solution  0.25 mg Nebulization BID  . chlorhexidine  15 mL Mouth Rinse BID  . enoxaparin (LOVENOX) injection  30 mg Subcutaneous Q24H  . fluticasone  2 spray Each Nare Daily  . ipratropium-albuterol  3 mL Nebulization Q6H  . levofloxacin (LEVAQUIN) IV  250 mg Intravenous Q48H  . loratadine  5 mg Oral Daily  . methylPREDNISolone (SOLU-MEDROL) injection  80 mg Intravenous Q6H  . metoprolol  2.5 mg Intravenous Q6H  . pantoprazole  40 mg Oral Q0600  . sodium chloride flush  3 mL Intravenous Q12H   Continuous Infusions: . dextrose 5 % and 0.45% NaCl       LOS: 0 days    Time spent: 40 mins    Viriginia Amendola, MD [facility] Pager [phone]  If 7PM-7AM, please contact night-coverage [email or web address] Password [facility] 03/01/2016, 9:41 AM

## 2016-03-01 NOTE — Progress Notes (Addendum)
Pharmacy Antibiotic Note  Andrea Weber is a 82 y.o. female admitted on 02/29/2016 with altered mental status.  Pharmacy has been consulted to adjust antibiotic dose based on patient's renal function.  PTA patient on Avelox 400mg  daily x 7 days for CAP, which was started on 4/23.  Patient received doses on 4/23 & 4/24.  Upon admission pt prescribed Levaquin 500mg  q24h for total of 5 days.    CrCl = 13 ml/min, which requires dose adjustment of Levaquin  Plan:  Levofloxacin 250mg  IV q48h x 3 days (total of 5 days of antibiotics as ordered by MD)  Follow renal function    Temp (24hrs), Avg:98.4 F (36.9 C), Min:97.7 F (36.5 C), Max:99.1 F (37.3 C)   Recent Labs Lab 02/29/16 2341 02/29/16 2347  WBC 11.4*  --   CREATININE 2.23*  --   LATICACIDVEN  --  0.70    Estimated Creatinine Clearance: 13.2 mL/min (by C-G formula based on Cr of 2.23).    No Known Allergies  Antimicrobials this admission: 4/25 Levaquin >>     Dose adjustments this admission:    Microbiology results: 4/25 UCx:    4/25 MRSA PCR:    Thank you for allowing pharmacy to be a part of this patient's care.  Maryellen PilePoindexter, Kataya Guimont Trefz, PharmD 03/01/2016 2:20 AM

## 2016-03-02 DIAGNOSIS — E875 Hyperkalemia: Secondary | ICD-10-CM

## 2016-03-02 DIAGNOSIS — E870 Hyperosmolality and hypernatremia: Secondary | ICD-10-CM

## 2016-03-02 DIAGNOSIS — N183 Chronic kidney disease, stage 3 (moderate): Secondary | ICD-10-CM

## 2016-03-02 DIAGNOSIS — R400 Somnolence: Secondary | ICD-10-CM

## 2016-03-02 DIAGNOSIS — J9602 Acute respiratory failure with hypercapnia: Secondary | ICD-10-CM

## 2016-03-02 DIAGNOSIS — I471 Supraventricular tachycardia: Secondary | ICD-10-CM

## 2016-03-02 DIAGNOSIS — J9622 Acute and chronic respiratory failure with hypercapnia: Secondary | ICD-10-CM

## 2016-03-02 LAB — MAGNESIUM: MAGNESIUM: 1.9 mg/dL (ref 1.7–2.4)

## 2016-03-02 LAB — CBC
HEMATOCRIT: 31.8 % — AB (ref 36.0–46.0)
HEMOGLOBIN: 10.3 g/dL — AB (ref 12.0–15.0)
MCH: 30.3 pg (ref 26.0–34.0)
MCHC: 32.4 g/dL (ref 30.0–36.0)
MCV: 93.5 fL (ref 78.0–100.0)
Platelets: 227 10*3/uL (ref 150–400)
RBC: 3.4 MIL/uL — AB (ref 3.87–5.11)
RDW: 14 % (ref 11.5–15.5)
WBC: 17.2 10*3/uL — AB (ref 4.0–10.5)

## 2016-03-02 LAB — BASIC METABOLIC PANEL
ANION GAP: 9 (ref 5–15)
BUN: 49 mg/dL — ABNORMAL HIGH (ref 6–20)
CHLORIDE: 98 mmol/L — AB (ref 101–111)
CO2: 39 mmol/L — AB (ref 22–32)
Calcium: 9.6 mg/dL (ref 8.9–10.3)
Creatinine, Ser: 1.92 mg/dL — ABNORMAL HIGH (ref 0.44–1.00)
GFR calc non Af Amer: 22 mL/min — ABNORMAL LOW (ref >60)
GFR, EST AFRICAN AMERICAN: 26 mL/min — AB (ref >60)
Glucose, Bld: 171 mg/dL — ABNORMAL HIGH (ref 65–99)
POTASSIUM: 5.1 mmol/L (ref 3.5–5.1)
Sodium: 146 mmol/L — ABNORMAL HIGH (ref 135–145)

## 2016-03-02 LAB — URINE CULTURE: Culture: NO GROWTH

## 2016-03-02 MED ORDER — IPRATROPIUM-ALBUTEROL 0.5-2.5 (3) MG/3ML IN SOLN
3.0000 mL | Freq: Three times a day (TID) | RESPIRATORY_TRACT | Status: DC
  Administered 2016-03-03: 3 mL via RESPIRATORY_TRACT
  Filled 2016-03-02: qty 3

## 2016-03-02 MED ORDER — DEXTROSE 5 % IV SOLN
INTRAVENOUS | Status: DC
  Administered 2016-03-02 – 2016-03-03 (×3): via INTRAVENOUS

## 2016-03-02 MED ORDER — METOPROLOL TARTRATE 1 MG/ML IV SOLN
2.5000 mg | Freq: Four times a day (QID) | INTRAVENOUS | Status: DC | PRN
  Filled 2016-03-02: qty 5

## 2016-03-02 MED ORDER — DILTIAZEM HCL 30 MG PO TABS
30.0000 mg | ORAL_TABLET | Freq: Three times a day (TID) | ORAL | Status: DC
  Administered 2016-03-02 – 2016-03-04 (×6): 30 mg via ORAL
  Filled 2016-03-02 (×6): qty 1

## 2016-03-02 MED ORDER — METHYLPREDNISOLONE SODIUM SUCC 125 MG IJ SOLR
80.0000 mg | Freq: Two times a day (BID) | INTRAMUSCULAR | Status: DC
  Administered 2016-03-02 – 2016-03-04 (×4): 80 mg via INTRAVENOUS
  Filled 2016-03-02 (×4): qty 1.28

## 2016-03-02 NOTE — Clinical Social Work Note (Signed)
Clinical Social Work Assessment  Patient Details  Name: Andrea Weber MRN: [id number] Date of Birth: 12-13-1925  Date of referral:  03/02/16               Reason for consult:  Facility Placement                Permission sought to share information with:  Facility Industrial/product designerContact Representative Permission granted to share information::  Yes, Verbal Permission Granted  Name::        Agency::     Relationship::     Contact Information:     Housing/Transportation Living arrangements for the past 2 months:  Skilled Nursing Facility Source of Information:  Power of Attorney Patient Interpreter Needed:  None Criminal Activity/Legal Involvement Pertinent to Current Situation/Hospitalization:  No - Comment as needed Significant Relationships:    Lives with:  Facility Resident Do you feel safe going back to the place where you live?  Yes Need for family participation in patient care:  Yes (Comment)  Care giving concerns:  CSW received consult that [facility].    Social Worker assessment / plan:  CSW confirmed with patient's POA, Andrea Weber that [facility] is [facility] & [facility]. [facility] that they would be able to take patient back when discharged.   Employment status:  Retired Database administratornsurance information:  Managed Medicare PT Recommendations:  Not assessed at this time Information / Referral to community resources:  Skilled Nursing Facility  Patient/Family's Response to care:  Patient's POA states that they have been pleased with the [facility].   Patient/Family's Understanding of and Emotional Response to Diagnosis, Current Treatment, and Prognosis:    Emotional Assessment Appearance:  Appears stated age Attitude/Demeanor/Rapport:    Affect (typically observed):    Orientation:    Alcohol / Substance use:    Psych involvement (Current  and /or in the community):     Discharge Needs  Concerns to be addressed:    Readmission within the last 30 days:    Current discharge risk:    Barriers to Discharge:      Andrea Weber, Andrea Maule F, LCSW 03/02/2016, 3:18 PM

## 2016-03-02 NOTE — Progress Notes (Signed)
Pt increasingly agitated this am. Pt has not voided and multiple attempts made [facility] and the pt refuses. Pt states she does not have to urinate and [facility]. Pt bladder scan shows 210cc. Janyth ContesK. Kirby,NP paged.

## 2016-03-02 NOTE — Progress Notes (Signed)
PROGRESS NOTE  Andrea Weber  ZOX:[id number] DOB: 03-11-1926 DOA: 02/29/2016 PCP: Margit Hanks, MD Outpatient Specialists:   Subjective: Denies any shortness of breath, denies any cough or fever. Patient is awake and alert but disoriented. Transfer to telemetry   Assessment & Plan:  Principal Problem:   Acute on chronic respiratory failure with hypercapnia (HCC) Active Problems:   COPD (chronic obstructive pulmonary disease) (HCC)   CKD (chronic kidney disease) stage 3, GFR 30-59 ml/min   Multifocal atrial tachycardia (HCC)   GERD without esophagitis   Anemia of chronic disease   Altered mental status   Elevated serum creatinine   Hypernatremia   Acute respiratory failure with hypercapnia (HCC)   COPD exacerbation (HCC)   Hyperkalemia   Acute on chronic respiratory failure with hypercapnia Presented with SOB, wheezing and initial ABG showed pH of 7.283 and PCO2 of 87.2. Plays on BiPAP briefly, admitted to stepdown. Currently on oxygen via nasal cannula at 1 L. This is likely secondary to acute COPD exacerbation.  Acute COPD exacerbation Presented with SOB, wheezing and cough. PCO2 on admission is 87.2. Started on steroids, and IV antibiotics. Continue supportive management with bronchodilators, mucolytics, antitussives and oxygen as needed.  Multifocal atrial tachycardia Currently rate controlled. Continue IV Lopressor as needed, will place back on home dose of Cardizem.  Hypernatremia Likely secondary to volume depletion. Continue to hold diuretics. Improved slightly with IV fluids, changed to D5W  Acute metabolic encephalopathy Likely secondary to hypercapnia, this is resolved.  Anemia of chronic disease H&H stable.  Elevated creatinine in chronic kidney disease stage III Likely secondary to dehydration. Improving with gentle hydration. Follow.  Abnormal chest x-ray Chest x-ray improved although patient is on IV fluids, continue to follow  clinically.  Leukocytosis Likely secondary to steroids. Chest x-ray negative for any acute infiltrate. Repeat chest x-ray pending. Urinalysis is unremarkable. Patient currently afebrile. Patient on IV Levaquin secondary to COPD exacerbation. Follow.  Gastroesophageal reflux disease PPI.  Hyperkalemia Slightly elevated at 5.1  DVT prophylaxis: Lovenox Code Status: Full Family Communication: Updated patient. No family at bedside. Disposition Plan: Transfer to telemetry.   Consultants:   None  Procedures:   CT head without contrast 02/29/2016  Chest x-ray 02/29/2016    Antimicrobials:   IV Levaquin for 25 2017    Objective: Filed Vitals:   03/02/16 0500 03/02/16 0535 03/02/16 0751 03/02/16 0800  BP:  152/81 147/81 151/81  Pulse:   79   Temp:    97.7 F (36.5 C)  TempSrc:    Oral  Resp:  Height:      Weight:      SpO2: 97% 93% 95% 100%    Intake/Output Summary (Last 24 hours) at 03/02/16 1014 Last data filed at 03/02/16 0800  Gross per 24 hour  Intake 2187.5 ml  Output    300 ml  Net 1887.5 ml   Filed Weights   03/01/16 0203  Weight: 51.3 kg (113 lb 1.5 oz)    Examination:  General exam: Appears calm and comfortable. Sleeping, easily arousable. Respiratory system: Decreased breath sounds in the bases. Poor to fair air movement. Minimal wheezing. Cardiovascular system: S1 & S2 heard, RRR. No JVD, murmurs, rubs, gallops or clicks. No pedal edema. Gastrointestinal system: Abdomen is nondistended, soft and nontender. No organomegaly or masses felt. Normal bowel sounds heard. Central nervous system: Alert and oriented. No focal neurological deficits. Extremities: Symmetric 5 x 5 power. Skin: No rashes, lesions or ulcers Psychiatry: Judgement  and insight appear normal. Mood & affect appropriate.     Data Reviewed: I have personally reviewed following labs and imaging studies  CBC:  Recent Labs Lab 02/29/16 2341 03/01/16 0327  03/02/16 0616  WBC 11.4* 11.2* 17.2*  NEUTROABS 10.6*  --   --   HGB 11.2* 11.1* 10.3*  HCT 35.7* 35.0* 31.8*  MCV 95.7 95.9 93.5  PLT 228 233 227   Basic Metabolic Panel:  Recent Labs Lab 02/29/16 2341 03/01/16 0327 03/01/16 0930 03/02/16 0315  NA 147* 147*  --  146*  K 5.0 5.6* 4.9 5.1  CL 97* 97*  --  98*  CO2 41* 42*  --  39*  GLUCOSE 142* 132*  --  171*  BUN 50* 50*  --  49*  CREATININE 2.23* 2.04*  --  1.92*  CALCIUM 10.5* 10.2  --  9.6  MG  --   --   --  1.9   GFR: Estimated Creatinine Clearance: 15.3 mL/min (by C-G formula based on Cr of 1.92). Liver Function Tests:  Recent Labs Lab 02/29/16 2341  AST 17  ALT 11*  ALKPHOS 80  BILITOT 0.2*  PROT 7.6  ALBUMIN 4.0   No results for input(s): LIPASE, AMYLASE in the last 168 hours. No results for input(s): AMMONIA in the last 168 hours. Coagulation Profile: No results for input(s): INR, PROTIME in the last 168 hours. Cardiac Enzymes:  Recent Labs Lab 03/01/16 0930 03/01/16 1335 03/01/16 1929  TROPONINI 0.07* 0.06* 0.05*   BNP (last 3 results) No results for input(s): PROBNP in the last 8760 hours. HbA1C: No results for input(s): HGBA1C in the last 72 hours. CBG:  Recent Labs Lab 02/29/16 2158  GLUCAP 126*   Lipid Profile: No results for input(s): CHOL, HDL, LDLCALC, TRIG, CHOLHDL, LDLDIRECT in the last 72 hours. Thyroid Function Tests: No results for input(s): TSH, T4TOTAL, FREET4, T3FREE, THYROIDAB in the last 72 hours. Anemia Panel: No results for input(s): VITAMINB12, FOLATE, FERRITIN, TIBC, IRON, RETICCTPCT in the last 72 hours. Urine analysis:    Component Value Date/Time   COLORURINE YELLOW 02/29/2016 2230   APPEARANCEUR CLOUDY* 02/29/2016 2230   LABSPEC 1.014 02/29/2016 2230   PHURINE 5.0 02/29/2016 2230   GLUCOSEU NEGATIVE 02/29/2016 2230   HGBUR TRACE* 02/29/2016 2230   BILIRUBINUR NEGATIVE 02/29/2016 2230   KETONESUR NEGATIVE 02/29/2016 2230   PROTEINUR 30* 02/29/2016 2230    UROBILINOGEN 0.2 06/26/2015 1055   NITRITE NEGATIVE 02/29/2016 2230   LEUKOCYTESUR NEGATIVE 02/29/2016 2230   Sepsis Labs: (procalcitonin:4,lacticidven:4)  ) Recent Results (from the past 240 hour(s))  MRSA PCR Screening     Status: None   Collection Time: 03/01/16  2:25 AM  Result Value Ref Range Status   MRSA by PCR NEGATIVE NEGATIVE Final    Comment:        The GeneXpert MRSA Assay (FDA approved for NASAL specimens only), is one component of a comprehensive MRSA colonization surveillance program. It is not intended to diagnose MRSA infection nor to guide or monitor treatment for MRSA infections.          Radiology Studies: Ct Head Wo Contrast  03/01/2016  CLINICAL DATA:  Altered mental status EXAM: CT HEAD WITHOUT CONTRAST TECHNIQUE: Contiguous axial images were obtained from the base of the skull through the vertex without intravenous contrast. COMPARISON:  06/26/2015 FINDINGS: Skull and Sinuses:Negative for acute fracture or destructive process. No sinus or mastoid fluid levels. Visualized orbits: Remote blowout fracture of the medial wall left orbit. Brain:  No evidence of acute infarction, hemorrhage, hydrocephalus, or mass lesion/mass effect. Chronic small vessel disease with stable pattern since 2016. Ischemic gliosis is confluent in the deep cerebral white matter. IMPRESSION: No acute finding.  Stable senescent changes. Electronically Signed   By: Marnee SpringJonathon  Watts M.D.   On: 03/01/2016 00:01   Dg Chest Port 1 View  03/01/2016  CLINICAL DATA:  COPD exacerbation. SOB. Coughing. Pt is weak. Pt takes HTN meds. Not diabetic. Stopped smoking in 1984. Smoker for a long time prior to quitting. EXAM: PORTABLE CHEST - 1 VIEW COMPARISON:  02/29/2016 FINDINGS: Stable cardiomegaly. Tortuous atheromatous aorta. Improvement in the interstitial edema or infiltrates. Mild central pulmonary vascular congestion remains. Surgical suture lines in the right mid lung. No  pneumothorax. No effusion. Visualized skeletal structures are unremarkable. IMPRESSION: 1. Improvement in interstitial edema since prior study, with persisting cardiomegaly. Electronically Signed   By: Corlis Leak  Hassell M.D.   On: 03/01/2016 10:07   Dg Chest Port 1 View  02/29/2016  CLINICAL DATA:  Acute onset of shortness of breath and altered mental status. Initial encounter. EXAM: PORTABLE CHEST 1 VIEW COMPARISON:  Chest radiograph performed 11/15/2015 FINDINGS: The lungs are well-aerated. A small left pleural effusion is noted. Vascular congestion is seen. Increased interstitial markings may reflect mild interstitial edema. There is no evidence of pneumothorax. The cardiomediastinal silhouette is mildly enlarged. No acute osseous abnormalities are seen. IMPRESSION: Small left pleural effusion noted. Vascular congestion and mild cardiomegaly. Increased interstitial markings may reflect mild interstitial edema. Electronically Signed   By: Roanna RaiderJeffery  Chang M.D.   On: 02/29/2016 22:26        Scheduled Meds: . antiseptic oral rinse  7 mL Mouth Rinse q12n4p  . arformoterol  15 mcg Nebulization BID  . budesonide (PULMICORT) nebulizer solution  0.25 mg Nebulization BID  . chlorhexidine  15 mL Mouth Rinse BID  . enoxaparin (LOVENOX) injection  30 mg Subcutaneous Q24H  . fluticasone  2 spray Each Nare Daily  . ipratropium-albuterol  3 mL Nebulization Q6H  . [START ON 03/03/2016] levofloxacin (LEVAQUIN) IV  250 mg Intravenous Q48H  . loratadine  5 mg Oral Daily  . methylPREDNISolone (SOLU-MEDROL) injection  80 mg Intravenous Q6H  . metoprolol  2.5 mg Intravenous Q6H  . pantoprazole  40 mg Oral Q0600  . sodium chloride flush  3 mL Intravenous Q12H   Continuous Infusions:     LOS: 1 day    Time spent: 40 mins    Emmerich Cryer A, MD [facility] Pager [phone]  If 7PM-7AM, please contact night-coverage [email or web address] Password [email or web address] 03/02/2016, 10:14 AM

## 2016-03-03 DIAGNOSIS — K219 Gastro-esophageal reflux disease without esophagitis: Secondary | ICD-10-CM

## 2016-03-03 DIAGNOSIS — J449 Chronic obstructive pulmonary disease, unspecified: Secondary | ICD-10-CM

## 2016-03-03 LAB — PROCALCITONIN: Procalcitonin: <0.1 ng/mL

## 2016-03-03 NOTE — Progress Notes (Signed)
PROGRESS NOTE  Andrea BenchWillie Weber Weber  HQI:[id number] DOB: 1926-01-13 DOA: 02/29/2016 PCP: Andrea HanksALEXANDER, ANNE D, MD Outpatient Specialists:   Subjective: She is fully awake and alert today, denies any complaints. Reported that she is on 2 L of oxygen at home.  Assessment & Plan:  Principal Problem:   Acute on chronic respiratory failure with hypercapnia (HCC) Active Problems:   COPD (chronic obstructive pulmonary disease) (HCC)   CKD (chronic kidney disease) stage 3, GFR 30-59 ml/min   Multifocal atrial tachycardia (HCC)   GERD without esophagitis   Anemia of chronic disease   Altered mental status   Elevated serum creatinine   Hypernatremia   Acute respiratory failure with hypercapnia (HCC)   COPD exacerbation (HCC)   Hyperkalemia   Acute on chronic respiratory failure with hypercapnia Presented with SOB, wheezing and initial ABG showed pH of 7.283 and PCO2 of 87.2. Plays on BiPAP briefly, admitted to stepdown. Currently on oxygen via nasal cannula at 1 L. This is likely secondary to acute COPD exacerbation.  Acute COPD exacerbation Presented with SOB, wheezing and cough. PCO2 on admission is 87.2. Started on steroids, and IV antibiotics. Continue supportive management with bronchodilators, mucolytics, antitussives and oxygen as needed. Discontinue IV fluids, give 1 dose of Lasix.  Multifocal atrial tachycardia Currently rate controlled. Continue IV Lopressor as needed, will place back on home dose of Cardizem.  Hypernatremia Likely secondary to volume depletion. Continue to hold diuretics. Improved slightly with IV fluids, changed to D5W  Acute metabolic encephalopathy Likely secondary to hypercapnia, this is resolved.  Anemia of chronic disease H&H stable.  Elevated creatinine in chronic kidney disease stage III Likely secondary to dehydration. Improving with gentle hydration. Follow.  Abnormal chest x-ray Chest x-ray improved although patient is on IV fluids,  continue to follow clinically.  Leukocytosis Likely secondary to steroids. Chest x-ray negative for any acute infiltrate. Repeat chest x-ray pending. Urinalysis is unremarkable. Patient currently afebrile. Patient on IV Levaquin secondary to COPD exacerbation. Follow.  Gastroesophageal reflux disease PPI.  Hyperkalemia Slightly elevated at 5.1  DVT prophylaxis: Lovenox Code Status: Full Family Communication: Updated patient. No family at bedside. Disposition Plan: Transfer to telemetry.   Consultants:   None  Procedures:   CT head without contrast 02/29/2016  Chest x-ray 02/29/2016    Antimicrobials:   IV Levaquin for 25 2017    Objective: Filed Vitals:   03/02/16 2136 03/03/16 0543 03/03/16 0811 03/03/16 0815  BP: 141/82 128/51    Pulse: 83 71    Temp: 98.2 F (36.8 C) 98 F (36.7 C)    TempSrc: Oral Oral    Resp: 20 18    Height:      Weight:      SpO2: 93% 100% 91% 91%    Intake/Output Summary (Last 24 hours) at 03/03/16 1125 Last data filed at 03/03/16 0845  Gross per 24 hour  Intake 1442.5 ml  Output    450 ml  Net  992.5 ml   Filed Weights   03/01/16 0203  Weight: 51.3 kg (113 lb 1.5 oz)    Examination:  General exam: Appears calm and comfortable. Sleeping, easily arousable. Respiratory system: Decreased breath sounds in the bases. Poor to fair air movement. Minimal wheezing. Cardiovascular system: S1 & S2 heard, RRR. No JVD, murmurs, rubs, gallops or clicks. No pedal edema. Gastrointestinal system: Abdomen is nondistended, soft and nontender. No organomegaly or masses felt. Normal bowel sounds heard. Central nervous system: Alert and oriented. No focal neurological deficits. Extremities: Symmetric  5 x 5 power. Skin: No rashes, lesions or ulcers Psychiatry: Judgement and insight appear normal. Mood & affect appropriate.     Data Reviewed: I have personally reviewed following labs and imaging studies  CBC:  Recent Labs Lab  02/29/16 2341 03/01/16 0327 03/02/16 0616  WBC 11.4* 11.2* 17.2*  NEUTROABS 10.6*  --   --   HGB 11.2* 11.1* 10.3*  HCT 35.7* 35.0* 31.8*  MCV 95.7 95.9 93.5  PLT 228 233 227   Basic Metabolic Panel:  Recent Labs Lab 02/29/16 2341 03/01/16 0327 03/01/16 0930 03/02/16 0315  NA 147* 147*  --  146*  K 5.0 5.6* 4.9 5.1  CL 97* 97*  --  98*  CO2 41* 42*  --  39*  GLUCOSE 142* 132*  --  171*  BUN 50* 50*  --  49*  CREATININE 2.23* 2.04*  --  1.92*  CALCIUM 10.5* 10.2  --  9.6  MG  --   --   --  1.9   GFR: Estimated Creatinine Clearance: 15.3 mL/min (by C-G formula based on Cr of 1.92). Liver Function Tests:  Recent Labs Lab 02/29/16 2341  AST 17  ALT 11*  ALKPHOS 80  BILITOT 0.2*  PROT 7.6  ALBUMIN 4.0   No results for input(s): LIPASE, AMYLASE in the last 168 hours. No results for input(s): AMMONIA in the last 168 hours. Coagulation Profile: No results for input(s): INR, PROTIME in the last 168 hours. Cardiac Enzymes:  Recent Labs Lab 03/01/16 0930 03/01/16 1335 03/01/16 1929  TROPONINI 0.07* 0.06* 0.05*   BNP (last 3 results) No results for input(s): PROBNP in the last 8760 hours. HbA1C: No results for input(s): HGBA1C in the last 72 hours. CBG:  Recent Labs Lab 02/29/16 2158  GLUCAP 126*   Lipid Profile: No results for input(s): CHOL, HDL, LDLCALC, TRIG, CHOLHDL, LDLDIRECT in the last 72 hours. Thyroid Function Tests: No results for input(s): TSH, T4TOTAL, FREET4, T3FREE, THYROIDAB in the last 72 hours. Anemia Panel: No results for input(s): VITAMINB12, FOLATE, FERRITIN, TIBC, IRON, RETICCTPCT in the last 72 hours. Urine analysis:    Component Value Date/Time   COLORURINE YELLOW 02/29/2016 2230   APPEARANCEUR CLOUDY* 02/29/2016 2230   LABSPEC 1.014 02/29/2016 2230   PHURINE 5.0 02/29/2016 2230   GLUCOSEU NEGATIVE 02/29/2016 2230   HGBUR TRACE* 02/29/2016 2230   BILIRUBINUR NEGATIVE 02/29/2016 2230   KETONESUR NEGATIVE 02/29/2016 2230    PROTEINUR 30* 02/29/2016 2230   UROBILINOGEN 0.2 06/26/2015 1055   NITRITE NEGATIVE 02/29/2016 2230   LEUKOCYTESUR NEGATIVE 02/29/2016 2230   Sepsis Labs: (procalcitonin:4,lacticidven:4)  ) Recent Results (from the past 240 hour(s))  Urine culture     Status: None   Collection Time: 02/29/16 10:30 PM  Result Value Ref Range Status   Specimen Description URINE, CLEAN CATCH  Final   Special Requests NONE  Final   Culture   Final    NO GROWTH 2 DAYS Performed at [facility]    Report Status 03/02/2016 FINAL  Final  MRSA PCR Screening     Status: None   Collection Time: 03/01/16  2:25 AM  Result Value Ref Range Status   MRSA by PCR NEGATIVE NEGATIVE Final    Comment:        The GeneXpert MRSA Assay (FDA approved for NASAL specimens only), is one component of a comprehensive MRSA colonization surveillance program. It is not intended to diagnose MRSA infection nor to guide or monitor treatment for MRSA infections.  Radiology Studies: No results found.      Scheduled Meds: . antiseptic oral rinse  7 mL Mouth Rinse q12n4p  . arformoterol  15 mcg Nebulization BID  . budesonide (PULMICORT) nebulizer solution  0.25 mg Nebulization BID  . chlorhexidine  15 mL Mouth Rinse BID  . diltiazem  30 mg Oral Q8H  . enoxaparin (LOVENOX) injection  30 mg Subcutaneous Q24H  . fluticasone  2 spray Each Nare Daily  . levofloxacin (LEVAQUIN) IV  250 mg Intravenous Q48H  . loratadine  5 mg Oral Daily  . methylPREDNISolone (SOLU-MEDROL) injection  80 mg Intravenous Q12H  . pantoprazole  40 mg Oral Q0600  . sodium chloride flush  3 mL Intravenous Q12H   Continuous Infusions: . dextrose 50 mL/hr at 03/03/16 0241     LOS: 2 days    Time spent: 40 mins    Herschel Fleagle A, MD [facility] Pager [phone]  If 7PM-7AM, please contact night-coverage [email or web address] Password [facility] 03/03/2016, 11:25 AM

## 2016-03-03 NOTE — Care Management Note (Signed)
Case Management Note  Patient Details  Name: Andrea Weber MRN: [id number] Date of Birth: December 22, 1925  Subjective/Objective: Transfer from SDU. COPD. Iv abx,iv solumedrol,02.From SNF-[facility]CSW already following.                   Action/Plan:d/c plan return SNF.   Expected Discharge Date:   (unknown)               Expected Discharge Plan:  Skilled Nursing Facility  In-House Referral:  NA, Clinical Social Work  Discharge planning Services  CM Consult  Post Acute Care Choice:  NA Choice offered to:  NA  DME Arranged:    DME Agency:     HH Arranged:    HH Agency:     Status of Service:  In process, will continue to follow  Medicare Important Message Given:    Date Medicare IM Given:    Medicare IM give by:    Date Additional Medicare IM Given:    Additional Medicare Important Message give by:     If discussed at Long Length of Stay Meetings, dates discussed:    Additional Comments:  Lanier ClamMahabir, Torrence Hammack, RN 03/03/2016, 11:54 AM

## 2016-03-03 NOTE — Progress Notes (Signed)
Patient stated that she had her lower partial teeth " at the place she came from".

## 2016-03-04 DIAGNOSIS — D638 Anemia in other chronic diseases classified elsewhere: Secondary | ICD-10-CM

## 2016-03-04 DIAGNOSIS — R748 Abnormal levels of other serum enzymes: Secondary | ICD-10-CM

## 2016-03-04 DIAGNOSIS — J441 Chronic obstructive pulmonary disease with (acute) exacerbation: Principal | ICD-10-CM

## 2016-03-04 LAB — BASIC METABOLIC PANEL
ANION GAP: 6 (ref 5–15)
BUN: 39 mg/dL — ABNORMAL HIGH (ref 6–20)
CALCIUM: 9.6 mg/dL (ref 8.9–10.3)
CHLORIDE: 98 mmol/L — AB (ref 101–111)
CO2: 37 mmol/L — AB (ref 22–32)
Creatinine, Ser: 1.52 mg/dL — ABNORMAL HIGH (ref 0.44–1.00)
GFR calc non Af Amer: 29 mL/min — ABNORMAL LOW (ref >60)
GFR, EST AFRICAN AMERICAN: 34 mL/min — AB (ref >60)
GLUCOSE: 156 mg/dL — AB (ref 65–99)
POTASSIUM: 4.8 mmol/L (ref 3.5–5.1)
Sodium: 141 mmol/L (ref 135–145)

## 2016-03-04 LAB — CREATININE, SERUM
Creatinine, Ser: 1.63 mg/dL — ABNORMAL HIGH (ref 0.44–1.00)
GFR calc Af Amer: 31 mL/min — ABNORMAL LOW (ref >60)
GFR, EST NON AFRICAN AMERICAN: 27 mL/min — AB (ref >60)

## 2016-03-04 MED ORDER — PREDNISONE 10 MG (21) PO TBPK: ORAL_TABLET | ORAL | Status: DC

## 2016-03-04 MED ORDER — MOXIFLOXACIN HCL 400 MG PO TABS: 400.0000 mg | ORAL_TABLET | Freq: Every day | ORAL | Status: DC

## 2016-03-04 MED ORDER — LEVOFLOXACIN 250 MG PO TABS: 250.0000 mg | ORAL_TABLET | Freq: Once | ORAL | Status: DC

## 2016-03-04 NOTE — Care Management Note (Signed)
Case Management Note  Patient Details  Name: Andrea Weber MRN: [id number] Date of Birth: May 18, 1926  Subjective/Objective:                    Action/Plan:d/c SNF.   Expected Discharge Date:   (unknown)               Expected Discharge Plan:  Skilled Nursing Facility  In-House Referral:  NA, Clinical Social Work  Discharge planning Services  CM Consult  Post Acute Care Choice:  NA Choice offered to:  NA  DME Arranged:    DME Agency:     HH Arranged:    HH Agency:     Status of Service:  Completed, signed off  Medicare Important Message Given:    Date Medicare IM Given:    Medicare IM give by:    Date Additional Medicare IM Given:    Additional Medicare Important Message give by:     If discussed at Long Length of Stay Meetings, dates discussed:    Additional Comments:  Andrea Weber, Andrea Prindle, RN 03/04/2016, 10:22 AM

## 2016-03-04 NOTE — Care Management Important Message (Signed)
Important Message  Patient Details  Name: Auburn BilberryWillie Baldwin Aubuchon MRN: [id number] Date of Birth: 1926-05-18   Medicare Important Message Given:  Yes    Haskell FlirtJamison, Sebrina Kessner 03/04/2016, 10:31 AMImportant Message  Patient Details  Name: Sammie BenchWillie Baldwin Iseminger MRN: [id number] Date of Birth: 1926-05-18   Medicare Important Message Given:  Yes    Haskell FlirtJamison, Ishan Sanroman 03/04/2016, 10:31 AM

## 2016-03-04 NOTE — Progress Notes (Signed)
[facility] is [facility]. Patient & POA, Marilu FavreClarence made aware. Discharge packet given to RN, Amil AmenJulia. [facility] called for transport to pickup at 11:30am.     Lincoln MaxinKelly Chrisy Hillebrand, LCSW [facility] Clinical Social Worker cell #: [phone]

## 2016-03-04 NOTE — Discharge Summary (Signed)
Physician Discharge Summary  Andrea Weber JJO:[id number] DOB: Nov 23, 1925 DOA: 02/29/2016  PCP: Hennie Duos, MD  Admit date: 02/29/2016 Discharge date: 03/04/2016  Time spent: 40 minutes  Recommendations for Outpatient Follow-up:  1. Follow up with PCP in 1 week. 2. Avelox for 3 more days, prednisone taper.   Discharge Diagnoses:  Principal Problem:   Acute on chronic respiratory failure with hypercapnia (HCC) Active Problems:   COPD (chronic obstructive pulmonary disease) (HCC)   CKD (chronic kidney disease) stage 3, GFR 30-59 ml/min   Multifocal atrial tachycardia (HCC)   GERD without esophagitis   Anemia of chronic disease   Altered mental status   Elevated serum creatinine   Hypernatremia   Acute respiratory failure with hypercapnia (HCC)   COPD exacerbation (HCC)   Hyperkalemia   Discharge Condition: Stable  Diet recommendation: heart healthy  Filed Weights   03/01/16 0203  Weight: 51.3 kg (113 lb 1.5 oz)    History of present illness:  Andrea Weber is a 82 y.o. female with a past medical history significant for COPD on nocturnal O2/BiPAP, anemia, and CKD who presents with altered mental status.  Patient unable to provide meaningful history and per ED, reports from nursing home conflicting and unreliable. Hx collected from chart review, NH medication guide and EMS via EDP.  Patient was brought in by EMS from her nursing home for altered mental status with disorientation, decreased responsiveness, and sluggishness. Per geriatrician notes, she is usually A&Ox3. Per MAR, she was started on Duo-nebs, steroids, and Avelox two days ago, presumably for COPD flare, but no reports of this percolated to [place] from NH. Today, she appears not to have been able to take them, and so IV forms were ordered (per [facility]), but an IV couldn't be started (or was placed and then she pulled it out?), and so she was sent to the ER.  In the ED, she was initially oriented  intermittently, but sluggish. Tachycardic, afebrile, saturating well on home O2, BP normal. Na 147, K 5.0, HCO341, Cr 2.23 (eGFR 21 from baseline 30), Hgb 11.2, WBC 11.4K, lactate normal. A venous blood gas showed acidosis with vpH 7.22 and pvCO2 107. BiPAP was started, fluids were administered and TRH were asked to evaluate for admission.  She had a similar presentation last August, was admitted for 7 days for altered mental status ultimately felt to be due to AKI and hypercapnia from COPD flare. It looks like she was treated with steroids and bronchodilators and discharged to SNF, but has been there ever since. It also looks like she was discharged with plans for a PSG, I don't see records that this was ever done, but the patient has been on nocturnal BiPAP 12/5 with 3 L bleed in nightly since then.   Hospital Course:   Acute on chronic respiratory failure with hypercapnia Presented with SOB, wheezing and initial ABG showed pH of 7.283 and PCO2 of 87.2. Plays on BiPAP briefly, admitted to stepdown. Taken off on BiPAP.  This is likely secondary to acute COPD exacerbation. This is resolved, patient back on 1 L oxygen through nasal cannula.  Acute COPD exacerbation Presented with SOB, wheezing and cough. PCO2 on admission is 87.2. Started on steroids, and IV antibiotics. Treated with supportive management with bronchodilators, mucolytics, antitussives and oxygen as needed. On discharge Avelox for 3 more days and prednisone taper.  Multifocal atrial tachycardia Currently rate controlled. Placed back on her oral home dose of Cardizem.  Hypernatremia Initially diuretics held, patient  plays in D5W infusion. This is resolved  Acute metabolic encephalopathy Likely secondary to hypercapnia and COPD aspiration, this is resolved. Presented with sodium of 147 and on discharge sodium is 141  Anemia of chronic disease H&H stable.  Acute kidney injury in chronic kidney disease stage  III Presented with creatinine of 2.23, diuretics held and given IV fluid for 1 day. On discharge creatinine is 1.5.  Abnormal chest x-ray Chest x-ray improved although patient given IV fluids, but still showing interstitial edema diuretics restarted.  Leukocytosis Likely secondary to steroids. CXR negative for any acute infiltrate. Urinalysis is unremarkable.  Could be secondary to the COPD exacerbation.  Gastroesophageal reflux disease PPI.  Hyperkalemia Slightly elevated at 5.1 this is improved after hydration.   Procedures:  None  Consultations:  None  Discharge Exam: Filed Vitals:   03/03/16 2122 03/04/16 0557  BP: 138/74 142/70  Pulse: 78 66  Temp: 97.7 F (36.5 C) 98.1 F (36.7 C)  Resp: 18 18  General: Alert and awake, oriented x3, not in any acute distress. HEENT: anicteric sclera, pupils reactive to light and accommodation, EOMI CVS: S1-S2 clear, no murmur rubs or gallops Chest: clear to auscultation bilaterally, no wheezing, rales or rhonchi Abdomen: soft nontender, nondistended, normal bowel sounds, no organomegaly Extremities: no cyanosis, clubbing or edema noted bilaterally Neuro: Cranial nerves II-XII intact, no focal neurological deficits  Discharge Instructions   Discharge Instructions    Diet - low sodium heart healthy    Complete by:  As directed      Increase activity slowly    Complete by:  As directed           Current Discharge Medication List    START taking these medications   Details  predniSONE (STERAPRED UNI-PAK 21 TAB) 10 MG (21) TBPK tablet Take 6-5-4-3-2-1 Po daily Qty: 21 tablet, Refills: 0      CONTINUE these medications which have CHANGED   Details  moxifloxacin (AVELOX) 400 MG tablet Take 1 tablet (400 mg total) by mouth daily at 8 pm. For 3 days Qty: 3 tablet, Refills: 0      CONTINUE these medications which have NOT CHANGED   Details  albuterol (ACCUNEB) 0.63 MG/3ML nebulizer solution Take 1 ampule by  nebulization every 4 (four) hours as needed for wheezing.    albuterol (PROVENTIL HFA;VENTOLIN HFA) 108 (90 BASE) MCG/ACT inhaler Inhale 2 puffs into the lungs every 6 (six) hours as needed for wheezing or shortness of breath.    cholecalciferol (VITAMIN D) 1000 UNITS tablet Take 1,000 Units by mouth daily.    diltiazem (CARDIZEM) 30 MG tablet Take 1 tablet (30 mg total) by mouth every 8 (eight) hours.    diphenhydrAMINE (BENADRYL) 25 mg capsule Take 1 capsule (25 mg total) by mouth every 8 (eight) hours as needed for itching. Qty: 30 capsule, Refills: 0    famotidine (PEPCID) 20 MG tablet Take 1 tablet (20 mg total) by mouth daily. Qty: 7 tablet, Refills: 0    ferrous sulfate 325 (65 FE) MG tablet Take 325 mg by mouth daily with breakfast.    Fluticasone-Salmeterol (ADVAIR) 250-50 MCG/DOSE AEPB Inhale 1 puff into the lungs 2 (two) times daily.    furosemide (LASIX) 20 MG tablet Take 1 tablet (20 mg total) by mouth daily. Qty: 30 tablet, Refills: prn   Associated Diagnoses: Bilateral lower extremity edema    guaiFENesin (MUCINEX) 600 MG 12 hr tablet Take 600 mg by mouth 2 (two) times daily. For 7 days  ipratropium-albuterol (DUONEB) 0.5-2.5 (3) MG/3ML SOLN Take 3 mLs by nebulization every 6 (six) hours as needed (congestion).    Nutritional Supplements (TWOCAL HN) LIQD Take 237 mLs by mouth 3 (three) times daily.       No Known Allergies Follow-up Information    Follow up with Hennie Duos, MD In 1 week.   Specialty:  Internal Medicine   Contact information:   [place] [address] [phone]        The results of significant diagnostics from this hospitalization (including imaging, microbiology, ancillary and laboratory) are listed below for reference.    Significant Diagnostic Studies: Ct Head Wo Contrast  03/01/2016  CLINICAL DATA:  Altered mental status EXAM: CT HEAD WITHOUT CONTRAST TECHNIQUE: Contiguous axial images were obtained from the  base of the skull through the vertex without intravenous contrast. COMPARISON:  06/26/2015 FINDINGS: Skull and Sinuses:Negative for acute fracture or destructive process. No sinus or mastoid fluid levels. Visualized orbits: Remote blowout fracture of the medial wall left orbit. Brain: No evidence of acute infarction, hemorrhage, hydrocephalus, or mass lesion/mass effect. Chronic small vessel disease with stable pattern since 2016. Ischemic gliosis is confluent in the deep cerebral white matter. IMPRESSION: No acute finding.  Stable senescent changes. Electronically Signed   By: Monte Fantasia M.D.   On: 03/01/2016 00:01   Dg Chest Port 1 View  03/01/2016  CLINICAL DATA:  COPD exacerbation. SOB. Coughing. Pt is weak. Pt takes HTN meds. Not diabetic. Stopped smoking in 1984. Smoker for a long time prior to quitting. EXAM: PORTABLE CHEST - 1 VIEW COMPARISON:  02/29/2016 FINDINGS: Stable cardiomegaly. Tortuous atheromatous aorta. Improvement in the interstitial edema or infiltrates. Mild central pulmonary vascular congestion remains. Surgical suture lines in the right mid lung. No pneumothorax. No effusion. Visualized skeletal structures are unremarkable. IMPRESSION: 1. Improvement in interstitial edema since prior study, with persisting cardiomegaly. Electronically Signed   By: Lucrezia Europe M.D.   On: 03/01/2016 10:07   Dg Chest Port 1 View  02/29/2016  CLINICAL DATA:  Acute onset of shortness of breath and altered mental status. Initial encounter. EXAM: PORTABLE CHEST 1 VIEW COMPARISON:  Chest radiograph performed 11/15/2015 FINDINGS: The lungs are well-aerated. A small left pleural effusion is noted. Vascular congestion is seen. Increased interstitial markings may reflect mild interstitial edema. There is no evidence of pneumothorax. The cardiomediastinal silhouette is mildly enlarged. No acute osseous abnormalities are seen. IMPRESSION: Small left pleural effusion noted. Vascular congestion and mild  cardiomegaly. Increased interstitial markings may reflect mild interstitial edema. Electronically Signed   By: Garald Balding M.D.   On: 02/29/2016 22:26    Microbiology: Recent Results (from the past 240 hour(s))  Urine culture     Status: None   Collection Time: 02/29/16 10:30 PM  Result Value Ref Range Status   Specimen Description URINE, CLEAN CATCH  Final   Special Requests NONE  Final   Culture   Final    NO GROWTH 2 DAYS Performed at [facility]    Report Status 03/02/2016 FINAL  Final  MRSA PCR Screening     Status: None   Collection Time: 03/01/16  2:25 AM  Result Value Ref Range Status   MRSA by PCR NEGATIVE NEGATIVE Final    Comment:        The GeneXpert MRSA Assay (FDA approved for NASAL specimens only), is one component of a comprehensive MRSA colonization surveillance program. It is not intended to diagnose MRSA infection nor  to guide or monitor treatment for MRSA infections.      Labs: Basic Metabolic Panel:  Recent Labs Lab 02/29/16 2341 03/01/16 0327 03/01/16 0930 03/02/16 0315 03/04/16 0436  NA 147* 147*  --  146* 141  K 5.0 5.6* 4.9 5.1 4.8  CL 97* 97*  --  98* 98*  CO2 41* 42*  --  39* 37*  GLUCOSE 142* 132*  --  171* 156*  BUN 50* 50*  --  49* 39*  CREATININE 2.23* 2.04*  --  1.92* 1.52*  1.63*  CALCIUM 10.5* 10.2  --  9.6 9.6  MG  --   --   --  1.9  --    Liver Function Tests:  Recent Labs Lab 02/29/16 2341  AST 17  ALT 11*  ALKPHOS 80  BILITOT 0.2*  PROT 7.6  ALBUMIN 4.0   No results for input(s): LIPASE, AMYLASE in the last 168 hours. No results for input(s): AMMONIA in the last 168 hours. CBC:  Recent Labs Lab 02/29/16 2341 03/01/16 0327 03/02/16 0616  WBC 11.4* 11.2* 17.2*  NEUTROABS 10.6*  --   --   HGB 11.2* 11.1* 10.3*  HCT 35.7* 35.0* 31.8*  MCV 95.7 95.9 93.5  PLT 228 233 227   Cardiac Enzymes:  Recent Labs Lab 03/01/16 0930 03/01/16 1335 03/01/16 1929  TROPONINI 0.07* 0.06* 0.05*    BNP: BNP (last 3 results)  Recent Labs  06/26/15 1035 03/01/16 0327  BNP 114.7* 466.8*    ProBNP (last 3 results) No results for input(s): PROBNP in the last 8760 hours.  CBG:  Recent Labs Lab 02/29/16 2158  GLUCAP 126*       Signed:  Marji Kuehnel A MD.  [facility] 03/04/2016, 10:15 AM

## 2016-03-04 NOTE — Progress Notes (Addendum)
PHARMACIST - PHYSICIAN COMMUNICATION  CONCERNING: Antibiotic IV to Oral Route Change Policy  RECOMMENDATION: This patient is receiving levofloxacin by the intravenous route.  Based on criteria approved by the Pharmacy and Therapeutics Committee, the antibiotic(s) is/are being converted to the equivalent oral dose form(s).   DESCRIPTION: These criteria include:  Patient being treated for a respiratory tract infection, urinary tract infection, cellulitis or clostridium difficile associated diarrhea if on metronidazole  The patient is not neutropenic and does not exhibit a GI malabsorption state  The patient is eating (either orally or via tube) and/or has been taking other orally administered medications for a least 24 hours  The patient is improving clinically and has a Tmax < 100.5  If you have questions about this conversion, please contact the Pharmacy Department  []   [phone]( [phone] )  [facility] []   [phone]( [phone] )  [facility] []   [phone]( [phone] )  [facility] []   [phone]( [phone] )  [facility] [x]   [phone]( [phone] )  [facility]   Grace IsaacYuhong  Liu, PharmD candidate 03/04/2016 7:48 AM   Note: next dose is due 4/29 and this will complete 5 days of therapy. Pharmacy will sign-off.  Charolotte Ekeom Salaam Battershell, PharmD, pager [phone]. 03/04/2016,10:13 AM.

## 2016-03-05 ENCOUNTER — Encounter: Payer: Self-pay | Admitting: Internal Medicine

## 2016-03-05 NOTE — Progress Notes (Addendum)
MRN: [id number] Name: Andrea Weber  Sex: female Age: 81 y.o. DOB: 10-Mar-1926  PSC #: [id number] Facility/Room:105 Level Of Care: SNF Provider: Merrilee Seashore D Emergency Contacts: Extended Emergency Contact Information Primary Emergency Contact: Mattocks,Clarence Address: [address]          [place], [place] [address] [place] Home Phone: [phone] Work Phone: [phone] Relation: Other Secondary Emergency Contact: Hetty Ely [place] Mobile Phone: [phone] Relation: Other  Code Status:   Allergies: Review of patient's allergies indicates no known allergies.  Chief Complaint  Patient presents with  . Acute Visit    HPI: Patient is 82 y.o. female who nursing has asked me to see urgently for cough and SOB for 2 days. Apparently pt was sick yesterday and call provider called in nebs and avelox which , 24 hours later, has yet to arrive at [facility]. Pt's O2 sat was reported at 88% on 2 Liters, normally in the 90's. No known fever. Still alert and drinking.  Past Medical History  Diagnosis Date  . Subarachnoid hemorrhage (HCC)   . Asthma   . Hypertension   . COPD (chronic obstructive pulmonary disease) (HCC)     On home O2  . Chronic kidney disease   . Anemia   . Dysrhythmia     Past Surgical History  Procedure Laterality Date  . Lung surgery    . Abdominal hysterectomy        Medication List    Notice    This visit is on the same day as an admission, and a visit start time could not be determined. If the visit took place after discharge, manually review the med list with the patient.      No orders of the defined types were placed in this encounter.     There is no immunization history on file for this patient.  Social History  Substance Use Topics  . Smoking status: Former Smoker    Types: Cigarettes  . Smokeless tobacco: Never Used  . Alcohol Use: No    Review of Systems  DATA OBTAINED: from  patient, nurse- as per HPI GENERAL:  no fevers, + ffatigue, appetite changes SKIN: No itching, rash HEENT: No complaint RESPIRATORY: + cough, wheezing, +SOB CARDIAC: No chest pain, palpitations, lower extremity edema  GI: No abdominal pain, No N/V/D or constipation, No heartburn or reflux  GU: No dysuria, frequency or urgency, or incontinence  MUSCULOSKELETAL: No unrelieved bone/joint pain NEUROLOGIC: No headache, dizziness  PSYCHIATRIC: No overt anxiety or sadness  Filed Vitals:   03/05/16 2236  BP: 137/78  Pulse: 73  Temp: 97.3 F (36.3 C)  Resp: 22    Physical Exam  GENERAL APPEARANCE: Alert, conversant, some work of breathing SKIN: No diaphoresis rash HEENT: Unremarkable RESPIRATORY: Breathing is even, mild labored. Lung sounds are diffusely decreased, no rales or rhonchi   CARDIOVASCULAR: Heart RRR no murmurs, rubs or gallops. No peripheral edema  GASTROINTESTINAL: Abdomen is soft, non-tender, not distended w/ normal bowel sounds.  GENITOURINARY: Bladder non tender, not distended  MUSCULOSKELETAL: No abnormal joints or musculature NEUROLOGIC: Cranial nerves 2-12 grossly intact. Moves all extremities PSYCHIATRIC: Mood and affect appropriate to situation, no behavioral issues  Patient Active Problem List   Diagnosis Date Noted  . Altered mental status 03/01/2016  . Acute on chronic respiratory failure with hypercapnia (HCC) 03/01/2016  . Elevated serum creatinine 03/01/2016  . Hypernatremia 03/01/2016  . Acute respiratory failure with hypercapnia (HCC) 03/01/2016  . COPD  exacerbation (HCC) 03/01/2016  . Hyperkalemia 03/01/2016  . Bilateral lower extremity edema 09/10/2015  . Chronic respiratory failure with hypercapnia (HCC) 09/03/2015  . GERD without esophagitis 09/03/2015  . Allergic rhinitis due to allergen 09/03/2015  . Anemia of chronic disease 09/03/2015  . COPD (chronic obstructive pulmonary disease) (HCC) 07/07/2015  . CKD (chronic kidney disease) stage 3,  GFR 30-59 ml/min 07/07/2015  . Multifocal atrial tachycardia (HCC) 07/07/2015    CBC    Component Value Date/Time   WBC 17.2* 03/02/2016 0616   WBC 4.3 01/15/2016   RBC 3.40* 03/02/2016 0616   HGB 10.3* 03/02/2016 0616   HCT 31.8* 03/02/2016 0616   PLT 227 03/02/2016 0616   MCV 93.5 03/02/2016 0616   LYMPHSABS 0.7 02/29/2016 2341   MONOABS 0.1 02/29/2016 2341   EOSABS 0.0 02/29/2016 2341   BASOSABS 0.0 02/29/2016 2341    CMP     Component Value Date/Time   NA 141 03/04/2016 0436   NA 148* 01/15/2016   K 4.8 03/04/2016 0436   CL 98* 03/04/2016 0436   CO2 37* 03/04/2016 0436   GLUCOSE 156* 03/04/2016 0436   BUN 39* 03/04/2016 0436   BUN 34* 01/15/2016   CREATININE 1.63* 03/04/2016 0436   CREATININE 1.52* 03/04/2016 0436   CREATININE 1.6* 01/15/2016   CALCIUM 9.6 03/04/2016 0436   PROT 7.6 02/29/2016 2341   ALBUMIN 4.0 02/29/2016 2341   AST 17 02/29/2016 2341   ALT 11* 02/29/2016 2341   ALKPHOS 80 02/29/2016 2341   BILITOT 0.2* 02/29/2016 2341   GFRNONAA 27* 03/04/2016 0436   GFRNONAA 29* 03/04/2016 0436   GFRAA 31* 03/04/2016 0436   GFRAA 34* 03/04/2016 0436    Assessment and Plan  HCAP, probable and COPD EXACERBATION - pt is a retainer but uses 3l at night with bipap so have inc O2 to 3L and O2 sat has increased to 93-94%; rocephin 1 gm IM ordered and solumedrol 125 mg IM with a taper 60/50/40/30/20/10/10;  duoneb now and q 4 scheduled; muccinex 600 mg BID ; IVF with NS at 75 cc/hr for 2 liters; pt is awake and alert, either she will turn around or will be going to the hospital    Time spent . 35 min;> 50% of time with patient was spent reviewing records, labs, tests and studies, counseling and developing plan of care  Margit HanksALEXANDER, Cledis Sohn D, MD

## 2016-03-07 ENCOUNTER — Encounter: Payer: Self-pay | Admitting: Internal Medicine

## 2016-03-07 ENCOUNTER — Non-Acute Institutional Stay (SKILLED_NURSING_FACILITY): Payer: Medicare Other | Admitting: Internal Medicine

## 2016-03-07 DIAGNOSIS — R938 Abnormal findings on diagnostic imaging of other specified body structures: Secondary | ICD-10-CM | POA: Diagnosis not present

## 2016-03-07 DIAGNOSIS — R9389 Abnormal findings on diagnostic imaging of other specified body structures: Secondary | ICD-10-CM | POA: Insufficient documentation

## 2016-03-07 DIAGNOSIS — D638 Anemia in other chronic diseases classified elsewhere: Secondary | ICD-10-CM

## 2016-03-07 DIAGNOSIS — I471 Supraventricular tachycardia: Secondary | ICD-10-CM | POA: Diagnosis not present

## 2016-03-07 DIAGNOSIS — J441 Chronic obstructive pulmonary disease with (acute) exacerbation: Secondary | ICD-10-CM | POA: Diagnosis not present

## 2016-03-07 DIAGNOSIS — E870 Hyperosmolality and hypernatremia: Secondary | ICD-10-CM | POA: Diagnosis not present

## 2016-03-07 DIAGNOSIS — K219 Gastro-esophageal reflux disease without esophagitis: Secondary | ICD-10-CM | POA: Diagnosis not present

## 2016-03-07 DIAGNOSIS — J9622 Acute and chronic respiratory failure with hypercapnia: Secondary | ICD-10-CM

## 2016-03-07 DIAGNOSIS — E559 Vitamin D deficiency, unspecified: Secondary | ICD-10-CM

## 2016-03-07 DIAGNOSIS — G9340 Encephalopathy, unspecified: Secondary | ICD-10-CM | POA: Diagnosis not present

## 2016-03-07 DIAGNOSIS — N179 Acute kidney failure, unspecified: Secondary | ICD-10-CM | POA: Diagnosis not present

## 2016-03-07 NOTE — Assessment & Plan Note (Signed)
Presented with SOB, wheezing and initial ABG showed pH of 7.283 and PCO2 of 87.2. Plays on BiPAP briefly, admitted to stepdown. Taken off on BiPAP.  This is likely secondary to acute COPD exacerbation. SNF - This is resolved, patient back on 1 L oxygen through nasal cannula.

## 2016-03-07 NOTE — Assessment & Plan Note (Signed)
SNF - stable; cont repacement 1000 u daily

## 2016-03-07 NOTE — Assessment & Plan Note (Signed)
Currently rate controlled. SNF -  home dose of Cardizem 30 mg q 8

## 2016-03-07 NOTE — Assessment & Plan Note (Signed)
Presented with creatinine of 2.23, diuretics held and given IV fluid for 1 day. On discharge creatinine is 1.5. SNF - f/u BMP

## 2016-03-07 NOTE — Assessment & Plan Note (Signed)
SNF - controlled ;cont pepcid 20 mg daily

## 2016-03-07 NOTE — Assessment & Plan Note (Addendum)
SNF - d/c Hb 10.3; cont iron; will f/u CBC

## 2016-03-07 NOTE — Assessment & Plan Note (Signed)
Presented with SOB, wheezing and cough. PCO2 on admission is 87.2. Started on steroids, and IV antibiotics. Treated with supportive management with bronchodilators, mucolytics, antitussives and oxygen as needed. SNF -  Avelox for 3 more days and prednisone taper.

## 2016-03-07 NOTE — Assessment & Plan Note (Signed)
Initially diuretics held, patient plays in D5W infusion. This is resolved

## 2016-03-07 NOTE — Assessment & Plan Note (Signed)
Likely secondary to hypercapnia and COPD aspiration, this is resolved. Presented with sodium of 147 and on discharge sodium is 141

## 2016-03-07 NOTE — Assessment & Plan Note (Signed)
Chest x-ray improved although patient given IV fluids, but still showing interstitial edema diuretics restarted.

## 2016-03-07 NOTE — Progress Notes (Signed)
MRN: [id number] Name: Andrea Weber  Sex: female Age: 82 y.o. DOB: 1926/08/03  [place] #: Karren Burly Facility/Room:214 Level Of Care: SNF Provider:Alexander, Webb Silversmith MD Emergency Contacts: Extended Emergency Contact Information Primary Emergency Contact: Mattocks,Clarence Address: [address]          [place], [place] [address] [place] Phone: [phone] Work Phone: [phone] Relation: Other Secondary Emergency Contact: [place] Phone: [phone] Relation: Other  Code Status: FullCode  Allergies: Review of patient's allergies indicates no known allergies.  Chief Complaint  Patient presents with  . Readmit To SNF    HPI: Patient is 82 y.o. female with COPD on nocturnal O2/BiPAP, anemia, and CKD who presents with altered mental status.Patient unable to provide meaningful history and per ED, reports from nursing home conflicting and unreliable.   Patient was brought in by EMS from her nursing home for altered mental status with disorientation, decreased responsiveness, and sluggishness. Per geriatrician notes, she is usually A&Ox3. Per MAR, she was started on Duo-nebs, steroids, and Avelox two days ago, presumably for COPD flare, but no reports of this percolated to [place] from NH. Today, she appears not to have been able to take them, and so IV forms were ordered (per [facility]), but an IV couldn't be started (or was placed and then she pulled it out?), and so she was sent to the ER.  In the ED, she was initially oriented intermittently, but sluggish. Tachycardic, afebrile, saturating well on home O2, BP normal. Na 147, K 5.0, HCO341, Cr 2.23 (eGFR 21 from baseline 30), Hgb 11.2, WBC 11.4K, lactate normal. A venous blood gas showed acidosis with vpH 7.22 and pvCO2 107. BiPAP was started, fluids were administered. PT was admitted to [facility] from 4/24-28 for acute respiratory hypercapnic failure from acute COPD exacerbation.  Hospital course was complicated by hypernatremia and acute encephalopathy.Pt is admitted to SNF dfor residential care. While at SNF pt will be followed for MAT, tx with cardizem, anemia , tx wit iron and Vit D def, tx with replacement.  Past Medical History  Diagnosis Date  . Subarachnoid hemorrhage ([place])   . Asthma   . Hypertension   . COPD (chronic obstructive pulmonary disease) ([place])     On home O2  . Chronic kidney disease   . Anemia   . Dysrhythmia     Past Surgical History  Procedure Laterality Date  . Lung surgery    . Abdominal hysterectomy        Medication List       This list is accurate as of: 03/07/16  6:16 PM.  Always use your most recent med list.               albuterol 108 (90 Base) MCG/ACT inhaler  Commonly known as:  PROVENTIL HFA;VENTOLIN HFA  Inhale 2 puffs into the lungs every 6 (six) hours as needed for wheezing or shortness of breath.     albuterol 0.63 MG/3ML nebulizer solution  Commonly known as:  ACCUNEB  Take 1 ampule by nebulization every 4 (four) hours as needed for wheezing.     cholecalciferol 1000 units tablet  Commonly known as:  VITAMIN D  Take 1,000 Units by mouth daily.     diltiazem 30 MG tablet  Commonly known as:  CARDIZEM  Take 1 tablet (30 mg total) by mouth every 8 (eight) hours.     diphenhydrAMINE 25 mg capsule  Commonly known as:  BENADRYL  Take 1 capsule (  25 mg total) by mouth every 8 (eight) hours as needed for itching.     famotidine 20 MG tablet  Commonly known as:  PEPCID  Take 1 tablet (20 mg total) by mouth daily.     ferrous sulfate 325 (65 FE) MG tablet  Take 325 mg by mouth daily with breakfast.     Fluticasone-Salmeterol 250-50 MCG/DOSE Aepb  Commonly known as:  ADVAIR  Inhale 1 puff into the lungs 2 (two) times daily.     furosemide 20 MG tablet  Commonly known as:  LASIX  Take 1 tablet (20 mg total) by mouth daily.     guaiFENesin 600 MG 12 hr tablet  Commonly known as:  MUCINEX  Take 600 mg by  mouth 2 (two) times daily. For 7 days     ipratropium-albuterol 0.5-2.5 (3) MG/3ML Soln  Commonly known as:  DUONEB  Take 3 mLs by nebulization every 6 (six) hours as needed (congestion).     moxifloxacin 400 MG tablet  Commonly known as:  AVELOX  Take 1 tablet (400 mg total) by mouth daily at 8 pm. For 3 days     predniSONE 10 MG (21) Tbpk tablet  Commonly known as:  STERAPRED UNI-PAK 21 TAB  Take 6-5-4-3-2-1 Po daily     TWOCAL HN Liqd  Take 237 mLs by mouth 3 (three) times daily.        No orders of the defined types were placed in this encounter.     There is no immunization history on file for this patient.  Social History  Substance Use Topics  . Smoking status: Former Smoker    Types: Cigarettes  . Smokeless tobacco: Never Used  . Alcohol Use: No    Family history is + HTN   Review of Systems  DATA OBTAINED: from patient, nurse GENERAL:  no fevers, fatigue, appetite changes SKIN: No itching, rash or wounds EYES: No eye pain, redness, discharge EARS: No earache, tinnitus, change in hearing NOSE: No congestion, drainage or bleeding  MOUTH/THROAT: No mouth or tooth pain, No sore throat RESPIRATORY: No cough, wheezing, SOB CARDIAC: No chest pain, palpitations, lower extremity edema  GI: No abdominal pain, No N/V/D or constipation, No heartburn or reflux  GU: No dysuria, frequency or urgency, or incontinence  MUSCULOSKELETAL: No unrelieved bone/joint pain NEUROLOGIC: No headache, dizziness or focal weakness PSYCHIATRIC: No c/o anxiety or sadness   Filed Vitals:   03/07/16 0907  BP: 144/81  Pulse: 80  Temp: 98.2 F (36.8 C)  Resp: 24    SpO2 Readings from Last 1 Encounters:  03/07/16 97%        Physical Exam  GENERAL APPEARANCE: Alert, conversant, BF  No acute distress.  SKIN: No diaphoresis rash HEAD: Normocephalic, atraumatic  EYES: Conjunctiva/lids clear. Pupils round, reactive. EOMs intact.  EARS: External exam WNL, canals clear. Hearing  grossly normal.  NOSE: No deformity or discharge.  MOUTH/THROAT: Lips w/o lesions  RESPIRATORY: Breathing is even, unlabored. Lung sounds are mild rale son R , goos SF   CARDIOVASCULAR: Heart RRR no murmurs, rubs or gallops. No peripheral edema.   GASTROINTESTINAL: Abdomen is soft, non-tender, not distended w/ normal bowel sounds. GENITOURINARY: Bladder non tender, not distended  MUSCULOSKELETAL: No abnormal joints or musculature NEUROLOGIC:  Cranial nerves 2-12 grossly intact. Moves all extremities  PSYCHIATRIC: Mood and affect appropriate to situation, no behavioral issues  Patient Active Problem List   Diagnosis Date Noted  . Abnormal chest x-ray 03/07/2016  . Vitamin D  deficiency 03/07/2016  . Altered mental status 03/01/2016  . Acute on chronic respiratory failure with hypercapnia ([place]) 03/01/2016  . Elevated serum creatinine 03/01/2016  . Hypernatremia 03/01/2016  . Acute respiratory failure with hypercapnia ([place]) 03/01/2016  . COPD exacerbation ([place]) 03/01/2016  . Hyperkalemia 03/01/2016  . Bilateral lower extremity edema 09/10/2015  . Chronic respiratory failure with hypercapnia ([place]) 09/03/2015  . GERD without esophagitis 09/03/2015  . Allergic rhinitis due to allergen 09/03/2015  . Anemia of chronic disease 09/03/2015  . COPD (chronic obstructive pulmonary disease) ([place]) 07/07/2015  . CKD (chronic kidney disease) stage 3, GFR 30-59 ml/min 07/07/2015  . Multifocal atrial tachycardia ([place]) 07/07/2015  . Acute encephalopathy 07/01/2015  . AKI (acute kidney injury) ([place]) 07/01/2015    CBC    Component Value Date/Time   WBC 17.2* 03/02/2016 0616   WBC 4.3 01/15/2016   RBC 3.40* 03/02/2016 0616   HGB 10.3* 03/02/2016 0616   HCT 31.8* 03/02/2016 0616   PLT 227 03/02/2016 0616   MCV 93.5 03/02/2016 0616   LYMPHSABS 0.7 02/29/2016 2341   MONOABS 0.1 02/29/2016 2341   EOSABS 0.0 02/29/2016 2341   BASOSABS 0.0 02/29/2016 2341    CMP     Component Value Date/Time    NA 141 03/04/2016 0436   NA 148* 01/15/2016   K 4.8 03/04/2016 0436   CL 98* 03/04/2016 0436   CO2 37* 03/04/2016 0436   GLUCOSE 156* 03/04/2016 0436   BUN 39* 03/04/2016 0436   BUN 34* 01/15/2016   CREATININE 1.63* 03/04/2016 0436   CREATININE 1.52* 03/04/2016 0436   CREATININE 1.6* 01/15/2016   CALCIUM 9.6 03/04/2016 0436   PROT 7.6 02/29/2016 2341   ALBUMIN 4.0 02/29/2016 2341   AST 17 02/29/2016 2341   ALT 11* 02/29/2016 2341   ALKPHOS 80 02/29/2016 2341   BILITOT 0.2* 02/29/2016 2341   GFRNONAA 27* 03/04/2016 0436   GFRNONAA 29* 03/04/2016 0436   GFRAA 31* 03/04/2016 0436   GFRAA 34* 03/04/2016 0436    Lab Results  Component Value Date   HGBA1C 4.9 06/26/2015     Ct Head Wo Contrast  03/01/2016  CLINICAL DATA:  Altered mental status EXAM: CT HEAD WITHOUT CONTRAST TECHNIQUE: Contiguous axial images were obtained from the base of the skull through the vertex without intravenous contrast. COMPARISON:  06/26/2015 FINDINGS: Skull and Sinuses:Negative for acute fracture or destructive process. No sinus or mastoid fluid levels. Visualized orbits: Remote blowout fracture of the medial wall left orbit. Brain: No evidence of acute infarction, hemorrhage, hydrocephalus, or mass lesion/mass effect. Chronic small vessel disease with stable pattern since 2016. Ischemic gliosis is confluent in the deep cerebral white matter. IMPRESSION: No acute finding.  Stable senescent changes. Electronically Signed   By: Monte Fantasia M.D.   On: 03/01/2016 00:01   Dg Chest Port 1 View  03/01/2016  CLINICAL DATA:  COPD exacerbation. SOB. Coughing. Pt is weak. Pt takes HTN meds. Not diabetic. Stopped smoking in 1984. Smoker for a long time prior to quitting. EXAM: PORTABLE CHEST - 1 VIEW COMPARISON:  02/29/2016 FINDINGS: Stable cardiomegaly. Tortuous atheromatous aorta. Improvement in the interstitial edema or infiltrates. Mild central pulmonary vascular congestion remains. Surgical suture lines in the  right mid lung. No pneumothorax. No effusion. Visualized skeletal structures are unremarkable. IMPRESSION: 1. Improvement in interstitial edema since prior study, with persisting cardiomegaly. Electronically Signed   By: Lucrezia Europe M.D.   On: 03/01/2016 10:07   Dg Chest Port 1 View  02/29/2016  CLINICAL  DATA:  Acute onset of shortness of breath and altered mental status. Initial encounter. EXAM: PORTABLE CHEST 1 VIEW COMPARISON:  Chest radiograph performed 11/15/2015 FINDINGS: The lungs are well-aerated. A small left pleural effusion is noted. Vascular congestion is seen. Increased interstitial markings may reflect mild interstitial edema. There is no evidence of pneumothorax. The cardiomediastinal silhouette is mildly enlarged. No acute osseous abnormalities are seen. IMPRESSION: Small left pleural effusion noted. Vascular congestion and mild cardiomegaly. Increased interstitial markings may reflect mild interstitial edema. Electronically Signed   By: Garald Balding M.D.   On: 02/29/2016 22:26    Not all labs, radiology exams or other studies done during hospitalization come through on my EPIC note; however they are reviewed by me.    Assessment and Plan  Acute on chronic respiratory failure with hypercapnia (HCC) Presented with SOB, wheezing and initial ABG showed pH of 7.283 and PCO2 of 87.2. Plays on BiPAP briefly, admitted to stepdown. Taken off on BiPAP.  This is likely secondary to acute COPD exacerbation. SNF - This is resolved, patient back on 1 L oxygen through nasal cannula.  COPD exacerbation ([place]) Presented with SOB, wheezing and cough. PCO2 on admission is 87.2. Started on steroids, and IV antibiotics. Treated with supportive management with bronchodilators, mucolytics, antitussives and oxygen as needed. SNF -  Avelox for 3 more days and prednisone taper.  Multifocal atrial tachycardia (HCC) Currently rate controlled. SNF -  home dose of Cardizem 30 mg q 8    Hypernatremia Initially diuretics held, patient plays in D5W infusion. This is resolved   Acute encephalopathy Likely secondary to hypercapnia and COPD aspiration, this is resolved. Presented with sodium of 147 and on discharge sodium is 141   AKI (acute kidney injury) ([place]) Presented with creatinine of 2.23, diuretics held and given IV fluid for 1 day. On discharge creatinine is 1.5. SNF - f/u BMP  Abnormal chest x-ray Chest x-ray improved although patient given IV fluids, but still showing interstitial edema diuretics restarted.   Anemia of chronic disease SNF - d/c Hb 10.3; cont iron; will f/u CBC  GERD without esophagitis SNF - controlled ;cont pepcid 20 mg daily  Vitamin D deficiency SNF - stable; cont repacement 1000 u daily   Tme spent > 45 min;> 50% of time with patient was spent reviewing records, labs, tests and studies, counseling and developing plan of care  Inocencio Homes   MD

## 2016-04-08 ENCOUNTER — Encounter: Payer: Self-pay | Admitting: Adult Health

## 2016-04-08 ENCOUNTER — Non-Acute Institutional Stay (SKILLED_NURSING_FACILITY): Payer: Medicare Other | Admitting: Adult Health

## 2016-04-08 DIAGNOSIS — N184 Chronic kidney disease, stage 4 (severe): Secondary | ICD-10-CM

## 2016-04-08 DIAGNOSIS — J449 Chronic obstructive pulmonary disease, unspecified: Secondary | ICD-10-CM

## 2016-04-08 DIAGNOSIS — I471 Supraventricular tachycardia: Secondary | ICD-10-CM

## 2016-04-08 DIAGNOSIS — D638 Anemia in other chronic diseases classified elsewhere: Secondary | ICD-10-CM

## 2016-04-08 DIAGNOSIS — J9612 Chronic respiratory failure with hypercapnia: Secondary | ICD-10-CM

## 2016-04-08 NOTE — Progress Notes (Signed)
Patient ID: Andrea Weber, female   DOB: 1926/01/01, 82 y.o.   MRN: [id number]   Location:  [facility] Room Number: 214-A Place of Service:  SNF (31)   CODE STATUS: DNR  No Known Allergies  Chief Complaint  Patient presents with  . Medical Management of Chronic Issues    Follow up    HPI:  She is a long term resident of this facility being seen for the management of her chronic illnesses. Overall there is little change in her status. She is telling me that she is feeling good and has no concerns. There are no nursing concerns at this time.   Past Medical History  Diagnosis Date  . Subarachnoid hemorrhage (HCC)   . Asthma   . Hypertension   . COPD (chronic obstructive pulmonary disease) (HCC)     On home O2  . Chronic kidney disease   . Anemia   . Dysrhythmia     Past Surgical History  Procedure Laterality Date  . Lung surgery    . Abdominal hysterectomy      Social History   Social History  . Marital Status: Widowed    Spouse Name: N/A  . Number of Children: N/A  . Years of Education: N/A   Occupational History  . Not on file.   Social History Main Topics  . Smoking status: Former Smoker    Types: Cigarettes  . Smokeless tobacco: Never Used  . Alcohol Use: No  . Drug Use: No  . Sexual Activity: Not Currently    Birth Control/ Protection: Post-menopausal   Other Topics Concern  . Not on file   Social History Narrative   Family History  Problem Relation Age of Onset  . Hypertension Mother   . Hypertension Father       VITAL SIGNS BP 144/81 mmHg  Pulse 80  Temp(Src) 98.2 F (36.8 C) (Oral)  Resp 24  Ht 5\' 1"  (1.549 m)  Wt 115 lb (52.164 kg)  BMI 21.74 kg/m2  SpO2 97%  Patient's Medications  New Prescriptions   No medications on file  Previous Medications   ALBUTEROL (ACCUNEB) 0.63 MG/3ML NEBULIZER SOLUTION    Take 1 ampule by nebulization every 4 (four) hours as needed for wheezing.   ALBUTEROL (PROVENTIL HFA;VENTOLIN HFA) 108 (90 BASE) MCG/ACT INHALER    Inhale 2 puffs into the lungs every 6 (six) hours as needed for wheezing or shortness of breath.   CHOLECALCIFEROL (VITAMIN D) 1000 UNITS TABLET    Take 1,000 Units by mouth daily.   DILTIAZEM (CARDIZEM) 30 MG TABLET    Take 1 tablet (30 mg total) by mouth every 8 (eight) hours.   DIPHENHYDRAMINE (BENADRYL) 25 MG CAPSULE    Take 1 capsule (25 mg total) by mouth every 8 (eight) hours as needed for itching.   FAMOTIDINE (PEPCID) 20 MG TABLET    Take 1 tablet (20 mg total) by mouth daily.   FERROUS SULFATE 325 (65 FE) MG TABLET    Take 325 mg by mouth daily with breakfast.   FLUTICASONE-SALMETEROL (ADVAIR) 250-50 MCG/DOSE AEPB    Inhale 1 puff into the lungs 2 (two) times daily.   FUROSEMIDE (LASIX) 20 MG TABLET    Take 1 tablet (20 mg total) by mouth daily.   IPRATROPIUM-ALBUTEROL (DUONEB) 0.5-2.5 (3) MG/3ML SOLN    Take 3 mLs by nebulization every 6 (six) hours as needed (congestion).   NUTRITIONAL SUPPLEMENTS (TWOCAL HN) LIQD    Take  237 mLs by mouth 3 (three) times daily.  Modified Medications   No medications on file  Discontinued Medications   GUAIFENESIN (MUCINEX) 600 MG 12 HR TABLET    Take 600 mg by mouth 2 (two) times daily. Reported on 04/08/2016   MOXIFLOXACIN (AVELOX) 400 MG TABLET    Take 1 tablet (400 mg total) by mouth daily at 8 pm. For 3 days   PREDNISONE (STERAPRED UNI-PAK 21 TAB) 10 MG (21) TBPK TABLET    Take 6-5-4-3-2-1 Po daily     SIGNIFICANT DIAGNOSTIC EXAMS  06-26-15: chest x-ray: Cardiomegaly and pulmonary vascular congestion.  No acute findings.  06-26-15: ct of head: Cardiomegaly and pulmonary vascular congestion.  No acute findings.  06-29-15: 2-d echo: - Left ventricle: The cavity size was normal. Wall thickness was increased in a pattern of mild LVH. Systolic function was vigorous. The estimated ejection fraction was in the range of 65% to 70%. Wall motion was normal; there were no regional wall  motion abnormalities. Doppler parameters are consistent with abnormal left ventricular relaxation (grade 1 diastolic dysfunction). Doppler parameters are consistent with high ventricular filling pressure. - Right ventricle: Systolic function was mildly reduced. - Pulmonary arteries: PA peak pressure: 36 mm Hg (S). - Pericardium, extracardiac: A trivial pericardial effusion was identified.  06-30-15: mri of brain: 1.  No acute intracranial abnormality. 2. Mild to moderate for age nonspecific signal changes in the brain, most commonly due to chronic small vessel disease.   LABS REVIEWED:   06-26-15; wbc 9.2; hgb 11.2; hct 35.9; mcv 98.4; plt 151; glucose 133; bun 34; creat 2.22; k+ 4.6; na++142; ast 54; albumin 3.9; mag 2.2; tsh 0.626; hgb a1c 4.9; blood culture: no growth 06-28-15; wbc 12.1; hgb 11.4; hct 35.5; mcv 92.9; plt 166; glucose 129; bun 44; creat 1.92; k+ 4.6; na++142; liver normal albumin 3.5 07-01-15: wbc 12.0; hgb 10.9; hct 35.8; mcv 94.7; plt 156; glucose 144; bun 50; creat 1.93; k+ 4.1; na++142; liver normal albumin 2.9  07-02-15: glucose 97; bun 50; creat 1.79; k+ 5.0; na++142  07-08-15; glucose 144; bun 34.9; creat 1.54; k+ 4.2; na++147; liver normal albumin 3.1 11-03-16: glucose 106; bun 27.2; creat 1.53; k+ 5.3; na++146;  01-15-16: wbc 4.3 ;hgb 8.7; hct 28.8; mcv 95.4; plt 132; glucose 72; bun 34.1; creat 1.64; k+ 4.4; na++148;  vitamin D 47.57       Review of Systems  Constitutional: Negative for appetite change and fatigue.  HENT: Negative for congestion.   Respiratory: Negative for cough, chest tightness and shortness of breath.   Cardiovascular: Negative for chest pain, palpitations and leg swelling.  Gastrointestinal: Negative for nausea, abdominal pain, diarrhea and constipation.  Musculoskeletal: Negative for myalgias and arthralgias.  Skin: Negative for pallor.  Psychiatric/Behavioral: The patient is not nervous/anxious.       Physical Exam  Constitutional:  She is oriented to person, place, and time. No distress.  Thin   Eyes: Conjunctivae are normal.  Neck: Neck supple. No JVD present. No thyromegaly present.  Cardiovascular: Normal rate and intact distal pulses.   Murmur heard. Murmur 1/6 Heart rate irregularly irregular   Respiratory: Effort normal. No respiratory distress.  Breath sounds diminished   GI: Soft. Bowel sounds are normal. She exhibits no distension. There is no tenderness.  Musculoskeletal: She exhibits no edema.  Able to move all extremities   Lymphadenopathy:    She has no cervical adenopathy.  Neurological: She is alert and oriented to person, place, and time.  Skin: Skin is  warm and dry. She is not diaphoretic.  Psychiatric: She has a normal mood and affect.     ASSESSMENT/ PLAN:  1. COPD: is stable will continue albuterol 2 puffs every 6 hours as needed; advair 250/50 twice daily will monitor  2. Chronic respiratory failure: is on bipap nightly will monitor   3. Atrial tachycardia: will continue cardizem 30 mg three times daily for rate control heart rate is presently stable  4. Edema: is presently stable will lower to  lasix 20 mg daily will monitor   5. Anemia: will continue iron daily hgb is 8.7  6. Allergic rhinitis: will continue claritin 10 mg daily  7. Gerd: will continue pepcid 20 mg daily   8. Chronic kidney disease stage III: is stable her last creat is 1.64  9. Weight loss: her current weight is 115 pounds; her weight is Dec 2016 was 123 pounds. Will continue supplements per facility protocol and will monitor             Synthia Innocenteborah Elijha Dedman NP [facility]  Contact [phone] Monday through Friday 8am- 5pm  After hours call [phone]

## 2016-04-12 DIAGNOSIS — M79671 Pain in right foot: Secondary | ICD-10-CM | POA: Diagnosis not present

## 2016-04-12 DIAGNOSIS — M79672 Pain in left foot: Secondary | ICD-10-CM | POA: Diagnosis not present

## 2016-04-12 DIAGNOSIS — B351 Tinea unguium: Secondary | ICD-10-CM | POA: Diagnosis not present

## 2016-04-18 ENCOUNTER — Encounter (HOSPITAL_COMMUNITY): Payer: Self-pay | Admitting: Emergency Medicine

## 2016-04-18 ENCOUNTER — Inpatient Hospital Stay (HOSPITAL_COMMUNITY)
Admission: EM | Admit: 2016-04-18 | Discharge: 2016-04-20 | DRG: 189 | Disposition: A | Discharge status: Skilled Nursing Facility | Payer: Medicare Other | Attending: Internal Medicine | Admitting: Internal Medicine

## 2016-04-18 ENCOUNTER — Emergency Department (HOSPITAL_COMMUNITY): Payer: Medicare Other

## 2016-04-18 ENCOUNTER — Inpatient Hospital Stay (HOSPITAL_COMMUNITY): Payer: Medicare Other

## 2016-04-18 DIAGNOSIS — Z87891 Personal history of nicotine dependence: Secondary | ICD-10-CM

## 2016-04-18 DIAGNOSIS — J441 Chronic obstructive pulmonary disease with (acute) exacerbation: Secondary | ICD-10-CM | POA: Diagnosis not present

## 2016-04-18 DIAGNOSIS — J209 Acute bronchitis, unspecified: Secondary | ICD-10-CM | POA: Diagnosis present

## 2016-04-18 DIAGNOSIS — Z9981 Dependence on supplemental oxygen: Secondary | ICD-10-CM

## 2016-04-18 DIAGNOSIS — I5033 Acute on chronic diastolic (congestive) heart failure: Secondary | ICD-10-CM | POA: Diagnosis not present

## 2016-04-18 DIAGNOSIS — N184 Chronic kidney disease, stage 4 (severe): Secondary | ICD-10-CM | POA: Diagnosis present

## 2016-04-18 DIAGNOSIS — G9341 Metabolic encephalopathy: Secondary | ICD-10-CM | POA: Diagnosis present

## 2016-04-18 DIAGNOSIS — J9622 Acute and chronic respiratory failure with hypercapnia: Secondary | ICD-10-CM | POA: Diagnosis present

## 2016-04-18 DIAGNOSIS — R4182 Altered mental status, unspecified: Secondary | ICD-10-CM | POA: Diagnosis present

## 2016-04-18 DIAGNOSIS — G9340 Encephalopathy, unspecified: Secondary | ICD-10-CM | POA: Diagnosis not present

## 2016-04-18 DIAGNOSIS — F0390 Unspecified dementia without behavioral disturbance: Secondary | ICD-10-CM | POA: Diagnosis present

## 2016-04-18 DIAGNOSIS — K219 Gastro-esophageal reflux disease without esophagitis: Secondary | ICD-10-CM | POA: Diagnosis present

## 2016-04-18 DIAGNOSIS — N183 Chronic kidney disease, stage 3 (moderate): Secondary | ICD-10-CM | POA: Diagnosis present

## 2016-04-18 DIAGNOSIS — J9621 Acute and chronic respiratory failure with hypoxia: Secondary | ICD-10-CM | POA: Diagnosis present

## 2016-04-18 DIAGNOSIS — I1310 Hypertensive heart and chronic kidney disease without heart failure, with stage 1 through stage 4 chronic kidney disease, or unspecified chronic kidney disease: Secondary | ICD-10-CM | POA: Diagnosis present

## 2016-04-18 DIAGNOSIS — J9811 Atelectasis: Secondary | ICD-10-CM | POA: Diagnosis present

## 2016-04-18 DIAGNOSIS — D509 Iron deficiency anemia, unspecified: Secondary | ICD-10-CM | POA: Diagnosis present

## 2016-04-18 DIAGNOSIS — J440 Chronic obstructive pulmonary disease with acute lower respiratory infection: Secondary | ICD-10-CM | POA: Diagnosis present

## 2016-04-18 DIAGNOSIS — I509 Heart failure, unspecified: Secondary | ICD-10-CM | POA: Diagnosis not present

## 2016-04-18 DIAGNOSIS — E870 Hyperosmolality and hypernatremia: Secondary | ICD-10-CM | POA: Diagnosis present

## 2016-04-18 DIAGNOSIS — Z66 Do not resuscitate: Secondary | ICD-10-CM | POA: Diagnosis present

## 2016-04-18 DIAGNOSIS — J449 Chronic obstructive pulmonary disease, unspecified: Secondary | ICD-10-CM | POA: Diagnosis present

## 2016-04-18 DIAGNOSIS — D631 Anemia in chronic kidney disease: Secondary | ICD-10-CM | POA: Diagnosis not present

## 2016-04-18 DIAGNOSIS — R1310 Dysphagia, unspecified: Secondary | ICD-10-CM | POA: Diagnosis not present

## 2016-04-18 DIAGNOSIS — N179 Acute kidney failure, unspecified: Secondary | ICD-10-CM | POA: Diagnosis not present

## 2016-04-18 DIAGNOSIS — I10 Essential (primary) hypertension: Secondary | ICD-10-CM | POA: Diagnosis not present

## 2016-04-18 DIAGNOSIS — J9612 Chronic respiratory failure with hypercapnia: Secondary | ICD-10-CM | POA: Diagnosis not present

## 2016-04-18 DIAGNOSIS — R402421 Glasgow coma scale score 9-12, in the field [EMT or ambulance]: Secondary | ICD-10-CM | POA: Diagnosis not present

## 2016-04-18 DIAGNOSIS — I517 Cardiomegaly: Secondary | ICD-10-CM | POA: Diagnosis not present

## 2016-04-18 LAB — COMPREHENSIVE METABOLIC PANEL
ALK PHOS: 61 U/L (ref 38–126)
ALT: 10 U/L — AB (ref 14–54)
ANION GAP: 7 (ref 5–15)
AST: 20 U/L (ref 15–41)
Albumin: 4.1 g/dL (ref 3.5–5.0)
BUN: 26 mg/dL — ABNORMAL HIGH (ref 6–20)
CALCIUM: 10.3 mg/dL (ref 8.9–10.3)
CHLORIDE: 98 mmol/L — AB (ref 101–111)
CO2: 40 mmol/L — ABNORMAL HIGH (ref 22–32)
CREATININE: 1.86 mg/dL — AB (ref 0.44–1.00)
GFR, EST AFRICAN AMERICAN: 27 mL/min — AB (ref >60)
GFR, EST NON AFRICAN AMERICAN: 23 mL/min — AB (ref >60)
Glucose, Bld: 120 mg/dL — ABNORMAL HIGH (ref 65–99)
Potassium: 4.8 mmol/L (ref 3.5–5.1)
SODIUM: 145 mmol/L (ref 135–145)
Total Bilirubin: 0.9 mg/dL (ref 0.3–1.2)
Total Protein: 7.1 g/dL (ref 6.5–8.1)

## 2016-04-18 LAB — CBC
HCT: 29.1 % — ABNORMAL LOW (ref 36.0–46.0)
Hemoglobin: 9.3 g/dL — ABNORMAL LOW (ref 12.0–15.0)
MCH: 29.6 pg (ref 26.0–34.0)
MCHC: 32 g/dL (ref 30.0–36.0)
MCV: 92.7 fL (ref 78.0–100.0)
Platelets: 140 10*3/uL — ABNORMAL LOW (ref 150–400)
RBC: 3.14 MIL/uL — ABNORMAL LOW (ref 3.87–5.11)
RDW: 14.3 % (ref 11.5–15.5)
WBC: 6.5 10*3/uL (ref 4.0–10.5)

## 2016-04-18 LAB — CBC WITH DIFFERENTIAL/PLATELET
BASOS ABS: 0 10*3/uL (ref 0.0–0.1)
BASOS PCT: 0 %
EOS PCT: 0 %
Eosinophils Absolute: 0 10*3/uL (ref 0.0–0.7)
HCT: 31.1 % — ABNORMAL LOW (ref 36.0–46.0)
Hemoglobin: 10 g/dL — ABNORMAL LOW (ref 12.0–15.0)
Lymphocytes Relative: 9 %
Lymphs Abs: 0.7 10*3/uL (ref 0.7–4.0)
MCH: 29.9 pg (ref 26.0–34.0)
MCHC: 32.2 g/dL (ref 30.0–36.0)
MCV: 92.8 fL (ref 78.0–100.0)
MONO ABS: 0.4 10*3/uL (ref 0.1–1.0)
Monocytes Relative: 5 %
Neutro Abs: 6.2 10*3/uL (ref 1.7–7.7)
Neutrophils Relative %: 86 %
PLATELETS: 143 10*3/uL — AB (ref 150–400)
RBC: 3.35 MIL/uL — AB (ref 3.87–5.11)
RDW: 14.3 % (ref 11.5–15.5)
WBC: 7.3 10*3/uL (ref 4.0–10.5)

## 2016-04-18 LAB — BLOOD GAS, ARTERIAL
Acid-Base Excess: 13.3 mmol/L — ABNORMAL HIGH (ref 0.0–2.0)
Bicarbonate: 41.1 mEq/L — ABNORMAL HIGH (ref 20.0–24.0)
DRAWN BY: 103701
O2 CONTENT: 3 L/min
O2 SAT: 95.5 %
PH ART: 7.338 — AB (ref 7.350–7.450)
Patient temperature: 98.6
TCO2: 38.9 mmol/L (ref 0–100)
pCO2 arterial: 78.7 mmHg (ref 35.0–45.0)
pO2, Arterial: 83.1 mmHg (ref 80.0–100.0)

## 2016-04-18 LAB — PHOSPHORUS: Phosphorus: 3.2 mg/dL (ref 2.5–4.6)

## 2016-04-18 LAB — URINALYSIS, ROUTINE W REFLEX MICROSCOPIC
Bilirubin Urine: NEGATIVE
GLUCOSE, UA: NEGATIVE mg/dL
Glucose, UA: NEGATIVE mg/dL
HGB URINE DIPSTICK: NEGATIVE
Hgb urine dipstick: NEGATIVE
Ketones, ur: NEGATIVE mg/dL
Ketones, ur: NEGATIVE mg/dL
Leukocytes, UA: NEGATIVE
Leukocytes, UA: NEGATIVE
Nitrite: NEGATIVE
Nitrite: NEGATIVE
Protein, ur: 100 mg/dL — AB
Protein, ur: 30 mg/dL — AB
Specific Gravity, Urine: 1.02 (ref 1.005–1.030)
Specific Gravity, Urine: 1.011 (ref 1.005–1.030)
pH: 6.5 (ref 5.0–8.0)
pH: 6.5 (ref 5.0–8.0)

## 2016-04-18 LAB — MRSA PCR SCREENING: MRSA by PCR: NEGATIVE

## 2016-04-18 LAB — CREATININE, SERUM
Creatinine, Ser: 1.89 mg/dL — ABNORMAL HIGH (ref 0.44–1.00)
GFR calc Af Amer: 26 mL/min — ABNORMAL LOW (ref >60)
GFR calc non Af Amer: 23 mL/min — ABNORMAL LOW (ref >60)

## 2016-04-18 LAB — PROTIME-INR
INR: 0.87 (ref 0.00–1.49)
PROTHROMBIN TIME: 12 s (ref 11.6–15.2)

## 2016-04-18 LAB — URINE MICROSCOPIC-ADD ON: RBC / HPF: NONE SEEN RBC/hpf (ref 0–5)

## 2016-04-18 LAB — TROPONIN I
TROPONIN I: 0.03 ng/mL (ref <0.031)
Troponin I: 0.03 ng/mL (ref <0.031)
Troponin I: 0.03 ng/mL (ref <0.031)
Troponin I: <0.03 ng/mL (ref <0.031)

## 2016-04-18 LAB — TSH: TSH: 0.641 u[IU]/mL (ref 0.350–4.500)

## 2016-04-18 LAB — MAGNESIUM: Magnesium: 2.3 mg/dL (ref 1.7–2.4)

## 2016-04-18 LAB — CBG MONITORING, ED: GLUCOSE-CAPILLARY: 122 mg/dL — AB (ref 65–99)

## 2016-04-18 LAB — BRAIN NATRIURETIC PEPTIDE: B NATRIURETIC PEPTIDE 5: 114.7 pg/mL — AB (ref 0.0–100.0)

## 2016-04-18 MED ORDER — LORAZEPAM 2 MG/ML IJ SOLN
1.0000 mg | Freq: Once | INTRAMUSCULAR | Status: AC
  Administered 2016-04-18: 1 mg via INTRAVENOUS
  Filled 2016-04-18: qty 1

## 2016-04-18 MED ORDER — IPRATROPIUM-ALBUTEROL 0.5-2.5 (3) MG/3ML IN SOLN: 3.0000 mL | Freq: Four times a day (QID) | RESPIRATORY_TRACT | Status: DC | PRN

## 2016-04-18 MED ORDER — IPRATROPIUM-ALBUTEROL 0.5-2.5 (3) MG/3ML IN SOLN: 3.0000 mL | RESPIRATORY_TRACT | Status: DC

## 2016-04-18 MED ORDER — LEVOFLOXACIN IN D5W 500 MG/100ML IV SOLN
500.0000 mg | Freq: Once | INTRAVENOUS | Status: AC
  Administered 2016-04-18: 500 mg via INTRAVENOUS
  Filled 2016-04-18: qty 100

## 2016-04-18 MED ORDER — LEVOFLOXACIN IN D5W 500 MG/100ML IV SOLN: 500.0000 mg | INTRAVENOUS | Status: DC

## 2016-04-18 MED ORDER — LEVOFLOXACIN IN D5W 250 MG/50ML IV SOLN: 250.0000 mg | INTRAVENOUS | Status: DC

## 2016-04-18 MED ORDER — ENOXAPARIN SODIUM 30 MG/0.3ML ~~LOC~~ SOLN
30.0000 mg | SUBCUTANEOUS | Status: DC
  Administered 2016-04-18 – 2016-04-20 (×3): 30 mg via SUBCUTANEOUS
  Filled 2016-04-18 (×3): qty 0.3

## 2016-04-18 MED ORDER — DILTIAZEM HCL 30 MG PO TABS
30.0000 mg | ORAL_TABLET | Freq: Three times a day (TID) | ORAL | Status: DC
  Administered 2016-04-20: 30 mg via ORAL
  Filled 2016-04-18: qty 1

## 2016-04-18 MED ORDER — MIRTAZAPINE 15 MG PO TABS
7.5000 mg | ORAL_TABLET | Freq: Every day | ORAL | Status: DC
Start: 2016-04-18 — End: 2016-04-20
  Filled 2016-04-18 (×2): qty 1

## 2016-04-18 MED ORDER — BUDESONIDE 0.25 MG/2ML IN SUSP
0.2500 mg | Freq: Two times a day (BID) | RESPIRATORY_TRACT | Status: DC
  Administered 2016-04-18 – 2016-04-20 (×5): 0.25 mg via RESPIRATORY_TRACT
  Filled 2016-04-18 (×5): qty 2

## 2016-04-18 MED ORDER — CETYLPYRIDINIUM CHLORIDE 0.05 % MT LIQD
7.0000 mL | Freq: Two times a day (BID) | OROMUCOSAL | Status: DC
  Administered 2016-04-18 – 2016-04-19 (×3): 7 mL via OROMUCOSAL

## 2016-04-18 MED ORDER — CHLORHEXIDINE GLUCONATE 0.12 % MT SOLN
15.0000 mL | Freq: Two times a day (BID) | OROMUCOSAL | Status: DC
  Administered 2016-04-18 – 2016-04-19 (×4): 15 mL via OROMUCOSAL
  Filled 2016-04-18 (×3): qty 15

## 2016-04-18 MED ORDER — DILTIAZEM HCL 30 MG PO TABS: 30.0000 mg | ORAL_TABLET | Freq: Three times a day (TID) | ORAL | Status: DC

## 2016-04-18 MED ORDER — IPRATROPIUM-ALBUTEROL 0.5-2.5 (3) MG/3ML IN SOLN
3.0000 mL | Freq: Four times a day (QID) | RESPIRATORY_TRACT | Status: DC
  Administered 2016-04-18 (×2): 3 mL via RESPIRATORY_TRACT
  Filled 2016-04-18 (×2): qty 3

## 2016-04-18 MED ORDER — METHYLPREDNISOLONE SODIUM SUCC 125 MG IJ SOLR
125.0000 mg | Freq: Once | INTRAMUSCULAR | Status: AC
  Administered 2016-04-18: 125 mg via INTRAVENOUS
  Filled 2016-04-18: qty 2

## 2016-04-18 MED ORDER — LEVOFLOXACIN IN D5W 750 MG/150ML IV SOLN: 750.0000 mg | Freq: Once | INTRAVENOUS | Status: DC

## 2016-04-18 MED ORDER — METHYLPREDNISOLONE SODIUM SUCC 40 MG IJ SOLR
40.0000 mg | Freq: Every day | INTRAMUSCULAR | Status: DC
  Administered 2016-04-18 – 2016-04-19 (×2): 40 mg via INTRAVENOUS
  Filled 2016-04-18 (×2): qty 1

## 2016-04-18 MED ORDER — HALOPERIDOL LACTATE 5 MG/ML IJ SOLN
1.0000 mg | Freq: Once | INTRAMUSCULAR | Status: AC
  Administered 2016-04-18: 1 mg via INTRAVENOUS

## 2016-04-18 MED ORDER — IPRATROPIUM-ALBUTEROL 0.5-2.5 (3) MG/3ML IN SOLN
3.0000 mL | Freq: Once | RESPIRATORY_TRACT | Status: AC
  Administered 2016-04-18: 3 mL via RESPIRATORY_TRACT
  Filled 2016-04-18: qty 3

## 2016-04-18 MED ORDER — FUROSEMIDE 10 MG/ML IJ SOLN
20.0000 mg | Freq: Two times a day (BID) | INTRAMUSCULAR | Status: DC
  Administered 2016-04-18 – 2016-04-19 (×3): 20 mg via INTRAVENOUS
  Filled 2016-04-18 (×3): qty 2

## 2016-04-18 MED ORDER — FAMOTIDINE 20 MG PO TABS
20.0000 mg | ORAL_TABLET | Freq: Every day | ORAL | Status: DC
  Administered 2016-04-20: 20 mg via ORAL
  Filled 2016-04-18 (×2): qty 1

## 2016-04-18 MED ORDER — SODIUM CHLORIDE 0.9 % IV SOLN
INTRAVENOUS | Status: AC
  Administered 2016-04-18: 11:00:00 via INTRAVENOUS

## 2016-04-18 MED ORDER — HALOPERIDOL LACTATE 5 MG/ML IJ SOLN
INTRAMUSCULAR | Status: AC
  Administered 2016-04-18: 1 mg via INTRAVENOUS
  Filled 2016-04-18: qty 1

## 2016-04-18 NOTE — ED Notes (Signed)
Bed: WA06 Expected date:  Expected time:  Means of arrival:  Comments: F 82s AMS, better now

## 2016-04-18 NOTE — Progress Notes (Signed)
Pharmacy Antibiotic Note  Andrea Weber is a 82 y.o. female admitted on 04/18/2016 with acute respiratory failure.  Pharmacy has been consulted for Levaquin dosing for AECOPD.  Pt has acute on CKDIII.  Plan: Levaquin 500 mg IV x 1 then 250 mg q48h for CrCl<20 ml/min.  Height: 5\' 1"  (154.9 cm) Weight: 108 lb 3.9 oz (49.1 kg) IBW/kg (Calculated) : 47.8  Temp (24hrs), Avg:97.6 F (36.4 C), Min:97.4 F (36.3 C), Max:97.7 F (36.5 C)   Recent Labs Lab 04/18/16 0740  WBC 7.3  CREATININE 1.86*    Estimated Creatinine Clearance: 15.8 mL/min (by C-G formula based on Cr of 1.86).    No Known Allergies  Antimicrobials this admission: 6/12 Levaquin >>   Dose adjustments this admission: -  Microbiology results: 6/12 UCx: ordered  6/12 Sputum: ordered  6/12 MRSA PCR: sent  Thank you for allowing pharmacy to be a part of this patient's care.  Clance BollRunyon, Tamiki Kuba 04/18/2016 9:46 AM

## 2016-04-18 NOTE — Progress Notes (Signed)
Patient had not output after lasix. Bladder scan showed 242 mL retained. MD paged and notified.

## 2016-04-18 NOTE — ED Notes (Addendum)
Pt continuous attempt to get out of bed, taking monitoring equipment off, hitting, kicking and biting; this is change from previous encounter with pt. Therapeutic conversation unsuccessful; see MAR. [place] at bedside until sitter present; order for safety sitter and head CT in place per Pfeiffer.

## 2016-04-18 NOTE — ED Notes (Addendum)
Per Ma Ringsand spoke with Cherlyn Robertsashera from [facility] pt normally a/o x4, without left side defects, and use of 3 lpm [place]. Pfeiffer aware of the above. Pending arterial blood gas to decide further intervention per Pfeiffer.

## 2016-04-18 NOTE — ED Notes (Signed)
Hospitalist at bedside 

## 2016-04-18 NOTE — ED Notes (Addendum)
Per EMS [facility] with complaint of AMS noted with awakening pt related to not wearing prescribed oxygen throughout the night; hx of COPD.   With triage pt leaning toward left, face asymetrical left, extremity grip equal bilateral, following some commands related to AMS. Unsure pt baseline. Pfeiffer at bedside.

## 2016-04-18 NOTE — ED Notes (Signed)
Pfeiffer made aware of conversation with [facility]. See ed note at same time written by Durward Parcelosser. This RN and Rosser recommend head CT and swallow study (hx of dysphagia) to Pfeiffer.

## 2016-04-18 NOTE — Progress Notes (Signed)
At approximately 2230 this RN was alerted by NT that the patient was sitting up in bed, attempting to pull her Bipap off. Upon entering the room, the patient appeared to be agitated, pulling at anything she could grasp (including IV lines, telemetry monitors, Bipap, and foley catheter). The patients movements were jerky and uncoordinated in appearance. The patient did not open her eyes, grasp, or perform any movements to command, however was clearly able to move all extremities to some degree. Right sided movements appeared to be weaker than the left, however, when sat up in the bed, the patient clearly leaned to the left side. Safety mits were placed on the patients hands. Attempts to reorient were unsuccessful. NP on call was notified and orders for Haldol were received. Will continue to monitor. Bosie HelperS. Lauralee Waters, RN

## 2016-04-18 NOTE — Care Management Note (Signed)
Case Management Note  Patient Details  Name: Andrea Weber MRN: [id number] Date of Birth: 02/21/1926  Subjective/Objective:                 hypoxia   Action/Plan:Date:  June12, 2017 Chart reviewed for concurrent status and case management needs. Will continue to follow the patient for changes and needs: Expected discharge date: [id number] Marcelle SmilingRhonda Davis, BSN, [place]3, [place]   [phone]  Expected Discharge Date:                  Expected Discharge Plan:  Home w Home Health Services  In-House Referral:  NA  Discharge planning Services  CM Consult  Post Acute Care Choice:  NA Choice offered to:  NA  DME Arranged:  Oxygen DME Agency:     HH Arranged:    HH Agency:     Status of Service:  In process, will continue to follow  Medicare Important Message Given:    Date Medicare IM Given:    Medicare IM give by:    Date Additional Medicare IM Given:    Additional Medicare Important Message give by:     If discussed at Long Length of Stay Meetings, dates discussed:    Additional Comments:  Golda AcreDavis, Rhonda Lynn, RN 04/18/2016, 9:50 AM

## 2016-04-18 NOTE — ED Provider Notes (Signed)
CSN: [id number]     Arrival date & time 04/18/16  [id number] History   First MD Initiated Contact with Patient 04/18/16 0741     Chief Complaint  Patient presents with  . Altered Mental Status     (Consider location/radiation/quality/duration/timing/severity/associated sxs/prior Treatment) HPI She has brought to emergency department by EMS. [facility] and not wearing her nasal cannula oxygen this morning. At baseline patient chronically wears home O2. This morning she was found to have her nasal cannula off and wrapped up in her hands. She was confused and disoriented. EMS reports on arrival oxygen saturation was 70%. They state they placed her on nonrebreather mask and on route had significant improvement of her mental status. However, upon arriving to the emergency department EMS report she became more drowsy again and started to list towards her left side and droop her head. EMS then thought she might have a left-sided facial droop, however, her definite last known well time was sometime yesterday evening. Past Medical History  Diagnosis Date  . Subarachnoid hemorrhage (HCC)   . Asthma   . Hypertension   . COPD (chronic obstructive pulmonary disease) (HCC)     On home O2  . Chronic kidney disease   . Anemia   . Dysrhythmia    Past Surgical History  Procedure Laterality Date  . Lung surgery    . Abdominal hysterectomy     Family History  Problem Relation Age of Onset  . Hypertension Mother   . Hypertension Father    Social History  Substance Use Topics  . Smoking status: Former Smoker    Types: Cigarettes  . Smokeless tobacco: Never Used  . Alcohol Use: No   OB History    No data available     Review of Systems  Cannot obtain due to patient condition confusion level V caveat  Allergies  Review of patient's allergies indicates no known allergies.  Home Medications   Prior to Admission medications   Medication Sig Start Date End  Date Taking? Authorizing Provider  albuterol (ACCUNEB) 0.63 MG/3ML nebulizer solution Take 1 ampule by nebulization every 4 (four) hours as needed for wheezing.    Historical Provider, MD  albuterol (PROVENTIL HFA;VENTOLIN HFA) 108 (90 BASE) MCG/ACT inhaler Inhale 2 puffs into the lungs every 6 (six) hours as needed for wheezing or shortness of breath.    Historical Provider, MD  cholecalciferol (VITAMIN D) 1000 UNITS tablet Take 1,000 Units by mouth daily.    Historical Provider, MD  diltiazem (CARDIZEM) 30 MG tablet Take 1 tablet (30 mg total) by mouth every 8 (eight) hours. 07/02/15   Costin Otelia SergeantM Gherghe, MD  diphenhydrAMINE (BENADRYL) 25 mg capsule Take 1 capsule (25 mg total) by mouth every 8 (eight) hours as needed for itching. 01/28/14   Susy Frizzleharles Sheldon, MD  famotidine (PEPCID) 20 MG tablet Take 1 tablet (20 mg total) by mouth daily. 01/28/14   Susy Frizzleharles Sheldon, MD  ferrous sulfate 325 (65 FE) MG tablet Take 325 mg by mouth daily with breakfast.    Historical Provider, MD  Fluticasone-Salmeterol (ADVAIR) 250-50 MCG/DOSE AEPB Inhale 1 puff into the lungs 2 (two) times daily.    Historical Provider, MD  furosemide (LASIX) 20 MG tablet Take 1 tablet (20 mg total) by mouth daily. 11/14/15   Sharee Holstereborah S Green, NP  ipratropium-albuterol (DUONEB) 0.5-2.5 (3) MG/3ML SOLN Take 3 mLs by nebulization every 6 (six) hours as needed (congestion).    Historical Provider, MD  Nutritional Supplements (TWOCAL HN) LIQD Take 237 mLs by mouth 3 (three) times daily.    Historical Provider, MD   BP 133/81 mmHg  Pulse 94  Temp(Src) 97.7 F (36.5 C) (Oral)  Resp 20  SpO2 94% Physical Exam  Constitutional: She appears well-developed and well-nourished.  Patient is in good physical condition for her age with some amount of deconditioning. She seems somnolent. Patient does not exhibit acute respiratory distress. Color is good. Patient becomes more alert and interactive with light stimulus.  HENT:  Head: Normocephalic and  atraumatic.  Mouth/Throat: Oropharynx is clear and moist.  Eyes: EOM are normal. Pupils are equal, round, and reactive to light.  Neck: Neck supple.  Cardiovascular: Normal rate, regular rhythm and intact distal pulses.   2/6 systolic ejection murmur, occasional ectopic beat.  Pulmonary/Chest: Effort normal. She has rales.  Have Rales at both lung fields.  Abdominal: Soft. Bowel sounds are normal. She exhibits no distension. There is no tenderness.  Musculoskeletal: Normal range of motion. She exhibits no edema or tenderness.  Neurological: She has normal strength. GCS eye subscore is 4. GCS verbal subscore is 5. GCS motor subscore is 6.  Patient is drowsy. She does awaken to voice and light stimulus. She attempts following commands but is confused and slow to complete tasks. I cannot isolate a unilateral deficit. She does list to the left while sitting in the bed. She however cooperates with equal accuracy on both left and right motor exam. Possible left oral droop however the patient is fairly slack-jawed sitting in the bed and falling asleep.  Skin: Skin is warm, dry and intact.  Psychiatric: She has a normal mood and affect.    ED Course  Procedures (including critical care time) CRITICAL CARE Performed by: Arby Barrette   Total critical care time: 30 minutes  Critical care time was exclusive of separately billable procedures and treating other patients.  Critical care was necessary to treat or prevent imminent or life-threatening deterioration.  Critical care was time spent personally by me on the following activities: development of treatment plan with patient and/or surrogate as well as nursing, discussions with consultants, evaluation of patient's response to treatment, examination of patient, obtaining history from patient or surrogate, ordering and performing treatments and interventions, ordering and review of laboratory studies, ordering and review of radiographic studies,  pulse oximetry and re-evaluation of patient's condition. Labs Review Labs Reviewed  COMPREHENSIVE METABOLIC PANEL - Abnormal; Notable for the following:    Chloride 98 (*)    CO2 40 (*)    Glucose, Bld 120 (*)    BUN 26 (*)    Creatinine, Ser 1.86 (*)    ALT 10 (*)    GFR calc non Af Amer 23 (*)    GFR calc Af Amer 27 (*)    All other components within normal limits  BRAIN NATRIURETIC PEPTIDE - Abnormal; Notable for the following:    B Natriuretic Peptide 114.7 (*)    All other components within normal limits  CBC WITH DIFFERENTIAL/PLATELET - Abnormal; Notable for the following:    RBC 3.35 (*)    Hemoglobin 10.0 (*)    HCT 31.1 (*)    Platelets 143 (*)    All other components within normal limits  BLOOD GAS, ARTERIAL - Abnormal; Notable for the following:    pH, Arterial 7.338 (*)    pCO2 arterial 78.7 (*)    Bicarbonate 41.1 (*)    Acid-Base Excess 13.3 (*)    All  other components within normal limits  CBG MONITORING, ED - Abnormal; Notable for the following:    Glucose-Capillary 122 (*)    All other components within normal limits  TROPONIN I  PROTIME-INR  URINALYSIS, ROUTINE W REFLEX MICROSCOPIC (NOT AT [facility])    Imaging Review Dg Chest Port 1 View  04/18/2016  CLINICAL DATA:  Low oxygen saturation. EXAM: PORTABLE CHEST 1 VIEW COMPARISON:  03/01/2016. FINDINGS: Surgical sutures noted over the right lung. Cardiomegaly with mild pulmonary vascular and interstitial prominence suggesting mild congestive heart failure. Mild left mid lung field subsegmental atelectasis and/or scarring. No pleural effusion or pneumothorax. IMPRESSION: 1.  Surgical sutures noted over the right lung. 2. Cardiomegaly with mild pulmonary vascular prominence and bilateral interstitial prominence consistent with mild congestive heart failure. 3. Left mid lung field subsegmental atelectasis and/or scarring. Electronically Signed   By: Maisie Fus  Register   On: 04/18/2016 08:13   I have personally reviewed  and evaluated these images and lab results as part of my medical decision-making.   EKG Interpretation   Date/Time:  Monday April 18 2016 07:40:35 EDT Ventricular Rate:  96 PR Interval:  188 QRS Duration: 90 QT Interval:  387 QTC Calculation: 489 R Axis:   -105 Text Interpretation:  Sinus rhythm Probable left atrial enlargement  Probable right ventricular hypertrophy Inferior infarct, old Baseline  wander in lead(s) V4 no change from previous Confirmed by Donnald Garre, MD,  Lebron Conners [phone]) on 04/18/2016 8:44:18 AM     Consult: [facility] for admission. MDM   Final diagnoses:  Acute on chronic respiratory failure with hypercapnia (HCC)  Altered mental status, unspecified altered mental status type   Patient presents with hypoxia at [facility] having been off of her oxygen during the night. ABG confirms hypercapnia. Patient will be started on BiPAP in the emergency department. Patient's mental status has been waxing and waning from somnolence agitation. Patient is afebrile, blood pressure and heart rate are stable. Patient's baseline mental status is reported as alert and oriented 4. CT head will also be obtained to confirm that there is no intracranial injury or occult finding. EMS felt that they noted a left facial droop, it is unclear at this time as there has been any motor change in conjunction with the degree of mental status change. Patient's last known well time was 9 PM yesterday evening per nursing staff. At this time, I most suspect hypercapnia as the primary etiology of the patient's mental status change. The patient will be admitted for ongoing treatment.    Arby Barrette, MD 04/18/16 [phone]

## 2016-04-18 NOTE — H&P (Signed)
History and Physical  Andrea Weber ZOX:[id number] DOB: May 15, 1926 DOA: 04/18/2016  Referring physician: ER Physician PCP: Margit Hanks, MD  Outpatient Specialists:   Patient coming from: SNF  Chief Complaint: Altered Mentation and respiratory failure  HPI: 82 year old African American female, SNF resident, with past medical history significant for subarachnoid hemorrhage, asthma, COPD on home oxygen, Hypertension, diastolic dysfuntion, CKD and anemia. Currently, patient can not give any history as patient is still sleepy from the effect of medication given to the patient to calm her down. Apparently, patient was said to be agitated earlier and was hitting the Healthcare staff. The history has come from collateral information and ER physician. Patient is said to have been brought to emergency department by EMS. [facility] with report of hypoxia and not wearing her nasal cannula oxygen this morning. At baseline patient chronically wears home O2. This morning she was found to have her nasal cannula off and wrapped up in her hands. She was confused and disoriented. EMS reported on arrival, oxygen saturation was 70%. Patient was her on nonrebreather mask and en-route to the ER, had significant improvement of her mental status. However, upon arriving to the emergency department EMS reported patient became more drowsy again. CT head is non revealing. ABG reveals PH of 7.338 and PCO2 of 78 and O2 of 83. Platelet is 143, BUN is 26 and Scr of 1.86 (close to baseline). CXR is said to reveal interstitial edema, scarring and left mid atelectasis. As stated above, patient is not able to participate in any history at the moment.   ED Course: See above.  Pertinent labs: See above EKG: Independently reviewed.  Imaging: independently reviewed.   Review of Systems: Unobtainable.   Past Medical History  Diagnosis Date  . Subarachnoid hemorrhage (HCC)   . Asthma   .  Hypertension   . COPD (chronic obstructive pulmonary disease) (HCC)     On home O2  . Chronic kidney disease   . Anemia   . Dysrhythmia     Past Surgical History  Procedure Laterality Date  . Lung surgery    . Abdominal hysterectomy       reports that she has quit smoking. Her smoking use included Cigarettes. She has never used smokeless tobacco. She reports that she does not drink alcohol or use illicit drugs.  No Known Allergies  Family History  Problem Relation Age of Onset  . Hypertension Mother   . Hypertension Father      Prior to Admission medications   Medication Sig Start Date End Date Taking? Authorizing Provider  albuterol (ACCUNEB) 0.63 MG/3ML nebulizer solution Take 1 ampule by nebulization every 4 (four) hours as needed for wheezing.   Yes Historical Provider, MD  albuterol (PROVENTIL HFA;VENTOLIN HFA) 108 (90 BASE) MCG/ACT inhaler Inhale 2 puffs into the lungs every 6 (six) hours as needed for wheezing or shortness of breath.   Yes Historical Provider, MD  cholecalciferol (VITAMIN D) 1000 UNITS tablet Take 1,000 Units by mouth daily.   Yes Historical Provider, MD  diltiazem (CARDIZEM) 30 MG tablet Take 1 tablet (30 mg total) by mouth every 8 (eight) hours. Patient taking differently: Take 30 mg by mouth 3 (three) times daily.  07/02/15  Yes Costin Otelia Sergeant, MD  diphenhydrAMINE (BENADRYL) 25 mg capsule Take 1 capsule (25 mg total) by mouth every 8 (eight) hours as needed for itching. 01/28/14  Yes Susy Frizzle, MD  famotidine (PEPCID) 20 MG tablet Take 1  tablet (20 mg total) by mouth daily. 01/28/14  Yes Susy Frizzle, MD  ferrous sulfate 325 (65 FE) MG tablet Take 325 mg by mouth daily with breakfast.   Yes Historical Provider, MD  Fluticasone-Salmeterol (ADVAIR) 250-50 MCG/DOSE AEPB Inhale 1 puff into the lungs 2 (two) times daily.   Yes Historical Provider, MD  furosemide (LASIX) 20 MG tablet Take 1 tablet (20 mg total) by mouth daily. 11/14/15  Yes Sharee Holster, NP  ipratropium-albuterol (DUONEB) 0.5-2.5 (3) MG/3ML SOLN Take 3 mLs by nebulization every 6 (six) hours as needed (for shortness of breath/wheezing).    Yes Historical Provider, MD  mirtazapine (REMERON) 7.5 MG tablet Take 7.5 mg by mouth at bedtime.   Yes Historical Provider, MD  Nutritional Supplements (TWOCAL HN) LIQD Take 237 mLs by mouth 3 (three) times daily.   Yes Historical Provider, MD  OXYGEN Inhale 3 L/min into the lungs as needed (for shortness of breath or wheezing).   Yes Historical Provider, MD    Physical Exam: Filed Vitals:   04/18/16 0741 04/18/16 0800 04/18/16 0842 04/18/16 0903  BP: 138/83 133/81 141/68 121/69  Pulse: 90 94 112 89  Temp: 97.7 F (36.5 C)   97.4 F (36.3 C)  TempSrc: Oral   Oral  Resp: 16 20 24 24   SpO2: 96% 94% 96% 98%     Constitutional:  . Sleepy (From effect of medication) Eyes:  . Pallor. ENMT:  . external ears, nose appear normal Neck:  . Neck is supple. No JVD. Respiratory:  . Decreased air entry globally.  Cardiovascular:  . S1S2, Systolic murmur, with increased intensity of S2. Abdomen:  . Abdomen is soft and non tender. Organs are not palpable.  Neurologic:  . Awake and alert. . Moves all limbs.    Wt Readings from Last 3 Encounters:  04/08/16 52.164 kg (115 lb)  03/07/16 51.71 kg (114 lb)  03/01/16 51.3 kg (113 lb 1.5 oz)    I have personally reviewed following labs and imaging studies  Labs on Admission:  CBC:  Recent Labs Lab 04/18/16 0740  WBC 7.3  NEUTROABS 6.2  HGB 10.0*  HCT 31.1*  MCV 92.8  PLT 143*   Basic Metabolic Panel:  Recent Labs Lab 04/18/16 0740  NA 145  K 4.8  CL 98*  CO2 40*  GLUCOSE 120*  BUN 26*  CREATININE 1.86*  CALCIUM 10.3   Liver Function Tests:  Recent Labs Lab 04/18/16 0740  AST 20  ALT 10*  ALKPHOS 61  BILITOT 0.9  PROT 7.1  ALBUMIN 4.1   No results for input(s): LIPASE, AMYLASE in the last 168 hours. No results for input(s): AMMONIA in the last  168 hours. Coagulation Profile:  Recent Labs Lab 04/18/16 0740  INR 0.87   Cardiac Enzymes:  Recent Labs Lab 04/18/16 0740  TROPONINI 0.03   BNP (last 3 results) No results for input(s): PROBNP in the last 8760 hours. HbA1C: No results for input(s): HGBA1C in the last 72 hours. CBG:  Recent Labs Lab 04/18/16 0738  GLUCAP 122*   Lipid Profile: No results for input(s): CHOL, HDL, LDLCALC, TRIG, CHOLHDL, LDLDIRECT in the last 72 hours. Thyroid Function Tests: No results for input(s): TSH, T4TOTAL, FREET4, T3FREE, THYROIDAB in the last 72 hours. Anemia Panel: No results for input(s): VITAMINB12, FOLATE, FERRITIN, TIBC, IRON, RETICCTPCT in the last 72 hours. Urine analysis:    Component Value Date/Time   COLORURINE AMBER* 04/18/2016 0820   APPEARANCEUR CLEAR 04/18/2016 0820  LABSPEC 1.020 04/18/2016 0820   PHURINE 6.5 04/18/2016 0820   GLUCOSEU NEGATIVE 04/18/2016 0820   HGBUR NEGATIVE 04/18/2016 0820   BILIRUBINUR SMALL* 04/18/2016 0820   KETONESUR NEGATIVE 04/18/2016 0820   PROTEINUR 100* 04/18/2016 0820   UROBILINOGEN 0.2 06/26/2015 1055   NITRITE NEGATIVE 04/18/2016 0820   LEUKOCYTESUR NEGATIVE 04/18/2016 0820   Sepsis Labs: @LABRCNTIP (procalcitonin:4,lacticidven:4) )No results found for this or any previous visit (from the past 240 hour(s)).    Radiological Exams on Admission: Ct Head Wo Contrast  04/18/2016  CLINICAL DATA:  Left facial droop and altered mental status today. Initial encounter. EXAM: CT HEAD WITHOUT CONTRAST TECHNIQUE: Contiguous axial images were obtained from the base of the skull through the vertex without intravenous contrast. COMPARISON:  Head CT scan 02/29/2016 and 06/26/2015. FINDINGS: Cortical atrophy and extensive chronic microvascular ischemic change are identified. No evidence of acute abnormality including hemorrhage, infarct, mass lesion, mass effect, midline shift or abnormal extra-axial fluid collection is seen. No hydrocephalus  or pneumocephalus. The calvarium is intact. Imaged paranasal sinuses and mastoid air cells are clear IMPRESSION: No acute abnormality. Atrophy and extensive chronic microvascular ischemic change. Electronically Signed   By: Drusilla Kannerhomas  Dalessio M.D.   On: 04/18/2016 09:11   Dg Chest Port 1 View  04/18/2016  CLINICAL DATA:  Low oxygen saturation. EXAM: PORTABLE CHEST 1 VIEW COMPARISON:  03/01/2016. FINDINGS: Surgical sutures noted over the right lung. Cardiomegaly with mild pulmonary vascular and interstitial prominence suggesting mild congestive heart failure. Mild left mid lung field subsegmental atelectasis and/or scarring. No pleural effusion or pneumothorax. IMPRESSION: 1.  Surgical sutures noted over the right lung. 2. Cardiomegaly with mild pulmonary vascular prominence and bilateral interstitial prominence consistent with mild congestive heart failure. 3. Left mid lung field subsegmental atelectasis and/or scarring. Electronically Signed   By: Maisie Fushomas  Register   On: 04/18/2016 08:13    EKG: Independently reviewed.   Active Problems:   Acute on chronic respiratory failure with hypercapnia (HCC)   COPD exacerbation (HCC)   Assessment/Plan 1. Acute on chronic Respiratory failure, hypercapnic. 2. Encephalopathy, probably metabolic 3. Atelectasis 4. COPD with exacerbation 5. Cannot rule interstitial lung disease 6. Pulmonary edema, likely acute on chronic CHF, diastolic.   Admit patient to Step Down  BiPAP  Consult Respiratory therapy to assist  IV steroids  Nebs Duoneb  Nebs Pulmicort  Antibiotics  Cautious diuresis  ECHO  Consider beta blocker and ACEI when respiratory and renal status permit  Guarded prognosis.  DVT prophylaxis: Lovenox, considering decreased renal function Code Status: Full until clarified further Family Communication:  Disposition Plan: SNF. Low threshold to consider Palliative care input   Consults called: Low threshold for pulmonary and Cardiology  input   Admission status: Inpatient    Time spent: 60 minutes  Berton MountSylvester Donyel Nester, MD  [facility] Pager #: [phone] 7PM-7AM contact night coverage as above   04/18/2016, 9:30 AM

## 2016-04-18 NOTE — ED Notes (Signed)
DG in process. 

## 2016-04-19 ENCOUNTER — Inpatient Hospital Stay (HOSPITAL_COMMUNITY): Payer: Medicare Other

## 2016-04-19 DIAGNOSIS — I509 Heart failure, unspecified: Secondary | ICD-10-CM

## 2016-04-19 DIAGNOSIS — J441 Chronic obstructive pulmonary disease with (acute) exacerbation: Secondary | ICD-10-CM

## 2016-04-19 LAB — CBC
HCT: 29.1 % — ABNORMAL LOW (ref 36.0–46.0)
Hemoglobin: 9.6 g/dL — ABNORMAL LOW (ref 12.0–15.0)
MCH: 30.7 pg (ref 26.0–34.0)
MCHC: 33 g/dL (ref 30.0–36.0)
MCV: 93 fL (ref 78.0–100.0)
Platelets: 153 10*3/uL (ref 150–400)
RBC: 3.13 MIL/uL — ABNORMAL LOW (ref 3.87–5.11)
RDW: 14.5 % (ref 11.5–15.5)
WBC: 6.6 10*3/uL (ref 4.0–10.5)

## 2016-04-19 LAB — BASIC METABOLIC PANEL
Anion gap: 5 (ref 5–15)
BUN: 31 mg/dL — ABNORMAL HIGH (ref 6–20)
CO2: 41 mmol/L — ABNORMAL HIGH (ref 22–32)
Calcium: 9.7 mg/dL (ref 8.9–10.3)
Chloride: 100 mmol/L — ABNORMAL LOW (ref 101–111)
Creatinine, Ser: 1.76 mg/dL — ABNORMAL HIGH (ref 0.44–1.00)
GFR calc Af Amer: 29 mL/min — ABNORMAL LOW (ref >60)
GFR calc non Af Amer: 25 mL/min — ABNORMAL LOW (ref >60)
Glucose, Bld: 122 mg/dL — ABNORMAL HIGH (ref 65–99)
Potassium: 4.9 mmol/L (ref 3.5–5.1)
Sodium: 146 mmol/L — ABNORMAL HIGH (ref 135–145)

## 2016-04-19 LAB — BLOOD GAS, ARTERIAL
ACID-BASE EXCESS: 12.7 mmol/L — AB (ref 0.0–2.0)
Acid-Base Excess: 12 mmol/L — ABNORMAL HIGH (ref 0.0–2.0)
BICARBONATE: 39.1 meq/L — AB (ref 20.0–24.0)
Bicarbonate: 39.8 mEq/L — ABNORMAL HIGH (ref 20.0–24.0)
DRAWN BY: 232811
Delivery systems: POSITIVE
Delivery systems: POSITIVE
Drawn by: 232811
EXPIRATORY PAP: 7
EXPIRATORY PAP: 7
FIO2: 0.3
FIO2: 0.3
INSPIRATORY PAP: 19
Inspiratory PAP: 19
O2 SAT: 95.9 %
O2 Saturation: 94.3 %
PATIENT TEMPERATURE: 97.8
PCO2 ART: 69.3 mmHg — AB (ref 35.0–45.0)
PH ART: 7.377 (ref 7.350–7.450)
PO2 ART: 82 mmHg (ref 80.0–100.0)
Patient temperature: 98.4
TCO2: 36.7 mmol/L (ref 0–100)
TCO2: 37.1 mmol/L (ref 0–100)
pCO2 arterial: 68.5 mmHg (ref 35.0–45.0)
pH, Arterial: 7.372 (ref 7.350–7.450)
pO2, Arterial: 70.9 mmHg — ABNORMAL LOW (ref 80.0–100.0)

## 2016-04-19 LAB — ECHOCARDIOGRAM COMPLETE
FS: 13 % — AB (ref 28–44)
Height: 61 in
IVS/LV PW RATIO, ED: 1.08
LA ID, A-P, ES: 22 mm
LA diam end sys: 22 mm
LA diam index: 1.51 cm/m2
LA vol A4C: 45.9 ml
LA vol index: 36 mL/m2
LA vol: 52.3 mL
LV PW d: 15.3 mm — AB (ref 0.6–1.1)
LVOT area: 3.14 cm2
LVOT diameter: 20 mm
TAPSE: 25.8 mm
Weight: 1731.93 oz

## 2016-04-19 LAB — URINE CULTURE: Culture: NO GROWTH

## 2016-04-19 MED ORDER — QUETIAPINE FUMARATE 50 MG PO TABS
25.0000 mg | ORAL_TABLET | Freq: Two times a day (BID) | ORAL | Status: DC
  Filled 2016-04-19: qty 1

## 2016-04-19 MED ORDER — IPRATROPIUM-ALBUTEROL 0.5-2.5 (3) MG/3ML IN SOLN
3.0000 mL | Freq: Four times a day (QID) | RESPIRATORY_TRACT | Status: DC
  Administered 2016-04-19 (×2): 3 mL via RESPIRATORY_TRACT
  Filled 2016-04-19 (×2): qty 3

## 2016-04-19 MED ORDER — HALOPERIDOL LACTATE 5 MG/ML IJ SOLN: 0.5000 mg | Freq: Once | INTRAMUSCULAR | Status: DC

## 2016-04-19 MED ORDER — HALOPERIDOL LACTATE 5 MG/ML IJ SOLN
0.5000 mg | Freq: Once | INTRAMUSCULAR | Status: AC
  Administered 2016-04-19: 0.5 mg via INTRAVENOUS
  Filled 2016-04-19: qty 1

## 2016-04-19 MED ORDER — DEXTROSE 5 % IV SOLN
INTRAVENOUS | Status: DC
  Administered 2016-04-19: 11:00:00 via INTRAVENOUS

## 2016-04-19 MED ORDER — HALOPERIDOL LACTATE 5 MG/ML IJ SOLN: 1.0000 mg | Freq: Four times a day (QID) | INTRAMUSCULAR | Status: DC | PRN

## 2016-04-19 MED ORDER — LORAZEPAM 2 MG/ML IJ SOLN
1.0000 mg | Freq: Once | INTRAMUSCULAR | Status: AC
  Administered 2016-04-19: 1 mg via INTRAVENOUS

## 2016-04-19 MED ORDER — LORAZEPAM 2 MG/ML IJ SOLN
INTRAMUSCULAR | Status: AC
  Administered 2016-04-19: 1 mg via INTRAVENOUS
  Filled 2016-04-19: qty 1

## 2016-04-19 MED ORDER — LIP MEDEX EX OINT
TOPICAL_OINTMENT | CUTANEOUS | Status: AC
  Administered 2016-04-19: 17:00:00
  Filled 2016-04-19: qty 7

## 2016-04-19 MED ORDER — IPRATROPIUM-ALBUTEROL 0.5-2.5 (3) MG/3ML IN SOLN
3.0000 mL | Freq: Two times a day (BID) | RESPIRATORY_TRACT | Status: DC
  Administered 2016-04-19 – 2016-04-20 (×2): 3 mL via RESPIRATORY_TRACT
  Filled 2016-04-19 (×2): qty 3

## 2016-04-19 MED ORDER — DEXTROSE 5 % IV SOLN
100.0000 mg | Freq: Two times a day (BID) | INTRAVENOUS | Status: DC
  Administered 2016-04-19 – 2016-04-20 (×3): 100 mg via INTRAVENOUS
  Filled 2016-04-19 (×4): qty 100

## 2016-04-19 MED ORDER — LORAZEPAM 2 MG/ML IJ SOLN
0.5000 mg | Freq: Four times a day (QID) | INTRAMUSCULAR | Status: DC | PRN
  Administered 2016-04-19: 0.5 mg via INTRAVENOUS
  Filled 2016-04-19: qty 1

## 2016-04-19 MED ORDER — HYDRALAZINE HCL 20 MG/ML IJ SOLN
5.0000 mg | INTRAMUSCULAR | Status: DC | PRN
  Administered 2016-04-19: 5 mg via INTRAVENOUS
  Filled 2016-04-19: qty 1

## 2016-04-19 NOTE — Progress Notes (Addendum)
PROGRESS NOTE                                                                                                                                                                                                             Patient Demographics:    Andrea Weber, is a 82 y.o. female, DOB - 1926-11-01, BJY:[id number]  Admit date - 04/18/2016   Admitting Physician Barnetta Chapel, MD  Outpatient Primary MD for the patient is Margit Hanks, MD  LOS - 1  Chief Complaint  Patient presents with  . Altered Mental Status       Brief Narrative    82 year old African American female, SNF resident, with past medical history significant for subarachnoid hemorrhage, asthma, COPD on home oxygen, Hypertension, diastolic dysfuntion, CKD and anemia. Currently, patient can not give any history as patient is still sleepy from the effect of medication given to the patient to calm her down. Apparently, patient was said to be agitated earlier and was hitting the Healthcare staff. The history has come from collateral information and ER physician. Patient is said to have been brought to emergency department by EMS.  [facility] with report of hypoxia and not wearing her nasal cannula oxygen this morning. At baseline patient chronically wears home O2. This morning she was found to have her nasal cannula off and wrapped up in her hands. She was confused and disoriented. EMS reported on arrival, oxygen saturation was 70%. Patient was her on nonrebreather mask and en-route to the ER, had significant improvement of her mental status. However, upon arriving to the emergency department EMS reported patient became more drowsy again. CT head is non revealing. ABG reveals PH of 7.338 and PCO2 of 78 and O2 of 83. Platelet is 143, BUN is 26 and Scr of 1.86 (close to baseline). CXR is said to reveal interstitial edema, scarring and left mid  atelectasis. As stated above, patient is not able to participate in any history at the moment.      Subjective:    Andrea Weber todayIs somnolent in bed, appears to be in no distress, unable to answer questions reliably.    Assessment  & Plan :     1.Metabolic encephalopathy. In a patient with early dementia = discussed with POA, baseline quality of care is not very  good, she had similar admission recently, her encephalopathy seems to be exacerbated by hypoxia this time, with supplemental oxygen and supportive care her oxygenation is improved, ABG shows compensated chronic hypercapnia, no wheezing on exam, will stop steroids. We'll continue supportive care with oxygen and nebulizer treatments. No signs of infection currently, question mild URI for which she has been placed on doxycycline, Will place on Seroquel to calm her down, QTC is borderline high therefore will avoid Haldol.  Discussed with POA, baseline quality of care not very good, plan of care as gentle medical treatment, if declines further full comfort care. She is DO NOT RESUSCITATE now.  2. History of asthma and COPD. Stable no wheezing, stop steroids, there was mention of mild cough, could have mild acute bronchitis for which she will be placed on doxycycline. Continue oxygen and nebulizer treatments along with supportive care. Monitor. ABG shows chronically compensated hypercapnia.  3. History of subarachnoid hemorrhage. CT head nonacute.  4. CK D stage IV. Baseline creatinine appears to be 1.8. Close to baseline.  5. Hypernatremia. Hold Lasix, gentle D5W.  6. Encephalopathy. We'll involve speech therapy to make sure patient is not an aspiration risk, for now dysphagia 3 diet with feeding assistance and aspiration precautions.  7. Iron deficiency anemia. On ferrous sulfate continue.  8. GERD. Pepcid.  9. History of MAT. Stable on Cardizem.    Code Status :  DO NOT RESUSCITATE  Family Communication  :  Discussed  with POA  Disposition Plan  :  SNF in 1-2 days with palliative care  Consults  :  Palliative care for goals of care  Procedures  :    CT head. Nonacute  DVT Prophylaxis  :  Lovenox   Lab Results  Component Value Date   PLT 153 04/19/2016    Inpatient Medications  Scheduled Meds: . antiseptic oral rinse  7 mL Mouth Rinse q12n4p  . budesonide (PULMICORT) nebulizer solution  0.25 mg Nebulization BID  . chlorhexidine  15 mL Mouth Rinse BID  . diltiazem  30 mg Oral Q8H  . doxycycline (VIBRAMYCIN) IV  100 mg Intravenous Q12H  . enoxaparin (LOVENOX) injection  30 mg Subcutaneous Q24H  . famotidine  20 mg Oral Daily  . furosemide  20 mg Intravenous Q12H  . ipratropium-albuterol  3 mL Nebulization QID  . mirtazapine  7.5 mg Oral QHS  . QUEtiapine  25 mg Oral BID   Continuous Infusions: . dextrose     PRN Meds:.hydrALAZINE, ipratropium-albuterol, LORazepam  Antibiotics  :    Anti-infectives    Start     Dose/Rate Route Frequency Ordered Stop   04/20/16 1000  levofloxacin (LEVAQUIN) IVPB 500 mg  Status:  Discontinued     500 mg 100 mL/hr over 60 Minutes Intravenous Every 48 hours 04/18/16 0940 04/18/16 0952   04/20/16 1000  Levofloxacin (LEVAQUIN) IVPB 250 mg  Status:  Discontinued     250 mg 50 mL/hr over 60 Minutes Intravenous Every 48 hours 04/18/16 0952 04/19/16 1052   04/19/16 1100  doxycycline (VIBRAMYCIN) 100 mg in dextrose 5 % 250 mL IVPB     100 mg 125 mL/hr over 120 Minutes Intravenous Every 12 hours 04/19/16 1052     04/18/16 1000  levofloxacin (LEVAQUIN) IVPB 750 mg  Status:  Discontinued     750 mg 100 mL/hr over 90 Minutes Intravenous  Once 04/18/16 0940 04/18/16 0952   04/18/16 1000  levofloxacin (LEVAQUIN) IVPB 500 mg     500 mg 100 mL/hr  over 60 Minutes Intravenous  Once 04/18/16 0952 04/18/16 1232         Objective:   Filed Vitals:   04/19/16 0755 04/19/16 0800 04/19/16 0900 04/19/16 1000  BP:  153/79 110/54 129/65  Pulse: 102 64 104   Temp:   98.2 F (36.8 C)    TempSrc:  Axillary    Resp: 30 29 25 28   Height:      Weight:      SpO2: 96% 94% 99% 100%    Wt Readings from Last 3 Encounters:  04/19/16 49.1 kg (108 lb 3.9 oz)  04/08/16 52.164 kg (115 lb)  03/07/16 51.71 kg (114 lb)     Intake/Output Summary (Last 24 hours) at 04/19/16 1054 Last data filed at 04/19/16 1000  Gross per 24 hour  Intake  817.5 ml  Output    730 ml  Net   87.5 ml     Physical Exam  Somnolent, does not appear to be any distress, unable to answer questions reliably or follow commands Dawn.AT,PERRAL Supple Neck,No JVD, No cervical lymphadenopathy appriciated.  Symmetrical Chest wall movement, Good air movement bilaterally, CTAB RRR,No Gallops,Rubs or new Murmurs, No Parasternal Heave +ve B.Sounds, Abd Soft, No tenderness, No organomegaly appriciated, No rebound - guarding or rigidity. No Cyanosis, Clubbing or edema, No new Rash or bruise      Data Review:    CBC  Recent Labs Lab 04/18/16 0740 04/18/16 1019 04/19/16 0315  WBC 7.3 6.5 6.6  HGB 10.0* 9.3* 9.6*  HCT 31.1* 29.1* 29.1*  PLT 143* 140* 153  MCV 92.8 92.7 93.0  MCH 29.9 29.6 30.7  MCHC 32.2 32.0 33.0  RDW 14.3 14.3 14.5  LYMPHSABS 0.7  --   --   MONOABS 0.4  --   --   EOSABS 0.0  --   --   BASOSABS 0.0  --   --     Chemistries   Recent Labs Lab 04/18/16 0740 04/18/16 1019 04/19/16 0315  NA 145  --  146*  K 4.8  --  4.9  CL 98*  --  100*  CO2 40*  --  41*  GLUCOSE 120*  --  122*  BUN 26*  --  31*  CREATININE 1.86* 1.89* 1.76*  CALCIUM 10.3  --  9.7  MG  --  2.3  --   AST 20  --   --   ALT 10*  --   --   ALKPHOS 61  --   --   BILITOT 0.9  --   --    ------------------------------------------------------------------------------------------------------------------ No results for input(s): CHOL, HDL, LDLCALC, TRIG, CHOLHDL, LDLDIRECT in the last 72 hours.  Lab Results  Component Value Date   HGBA1C 4.9 06/26/2015    ------------------------------------------------------------------------------------------------------------------  Recent Labs  04/18/16 1019  TSH 0.641   ------------------------------------------------------------------------------------------------------------------ No results for input(s): VITAMINB12, FOLATE, FERRITIN, TIBC, IRON, RETICCTPCT in the last 72 hours.  Coagulation profile  Recent Labs Lab 04/18/16 0740  INR 0.87    No results for input(s): DDIMER in the last 72 hours.  Cardiac Enzymes  Recent Labs Lab 04/18/16 1019 04/18/16 1608 04/18/16 2233  TROPONINI 0.03 0.03 <0.03   ------------------------------------------------------------------------------------------------------------------    Component Value Date/Time   BNP 114.7* 04/18/2016 0740    Micro Results Recent Results (from the past 240 hour(s))  MRSA PCR Screening     Status: None   Collection Time: 04/18/16  9:39 AM  Result Value Ref Range Status   MRSA by PCR  NEGATIVE NEGATIVE Final    Comment:        The GeneXpert MRSA Assay (FDA approved for NASAL specimens only), is one component of a comprehensive MRSA colonization surveillance program. It is not intended to diagnose MRSA infection nor to guide or monitor treatment for MRSA infections.     Radiology Reports Ct Head Wo Contrast  04/18/2016  CLINICAL DATA:  Left facial droop and altered mental status today. Initial encounter. EXAM: CT HEAD WITHOUT CONTRAST TECHNIQUE: Contiguous axial images were obtained from the base of the skull through the vertex without intravenous contrast. COMPARISON:  Head CT scan 02/29/2016 and 06/26/2015. FINDINGS: Cortical atrophy and extensive chronic microvascular ischemic change are identified. No evidence of acute abnormality including hemorrhage, infarct, mass lesion, mass effect, midline shift or abnormal extra-axial fluid collection is seen. No hydrocephalus or pneumocephalus. The calvarium is  intact. Imaged paranasal sinuses and mastoid air cells are clear IMPRESSION: No acute abnormality. Atrophy and extensive chronic microvascular ischemic change. Electronically Signed   By: Drusilla Kannerhomas  Dalessio M.D.   On: 04/18/2016 09:11   Dg Chest Port 1 View  04/18/2016  CLINICAL DATA:  Low oxygen saturation. EXAM: PORTABLE CHEST 1 VIEW COMPARISON:  03/01/2016. FINDINGS: Surgical sutures noted over the right lung. Cardiomegaly with mild pulmonary vascular and interstitial prominence suggesting mild congestive heart failure. Mild left mid lung field subsegmental atelectasis and/or scarring. No pleural effusion or pneumothorax. IMPRESSION: 1.  Surgical sutures noted over the right lung. 2. Cardiomegaly with mild pulmonary vascular prominence and bilateral interstitial prominence consistent with mild congestive heart failure. 3. Left mid lung field subsegmental atelectasis and/or scarring. Electronically Signed   By: Maisie Fushomas  Register   On: 04/18/2016 08:13    Time Spent in minutes  30   Susa RaringSINGH,Lakin Romer K M.D on 04/19/2016 at 10:54 AM  Between 7am to 7pm - Pager - [phone]  After 7pm go to [email or web address] - password [email or web address]  [facility] -  Office  [phone]

## 2016-04-19 NOTE — Progress Notes (Signed)
Pt experiencing increaing agitation, restlessness, and anxiety. Patient pulling at lines, tubing, bipap, and foley catheter and attempting to get out of bed. Unable to redirect patient at this time. NP on call notified. Will continue to monitor. Pollyann KennedyS. Linzy Darling,RN

## 2016-04-19 NOTE — Progress Notes (Signed)
  Echocardiogram  2D Echocardiogram has been performed.  Andrea SavoyCasey N Ezri Weber 04/19/2016, 9:47 AM

## 2016-04-19 NOTE — Progress Notes (Signed)
Pt's code status on chart said "prior". Reviewed attending's note from today and after discussion with family, pt is DNR. Order changed to DNR.

## 2016-04-20 DIAGNOSIS — G9340 Encephalopathy, unspecified: Secondary | ICD-10-CM

## 2016-04-20 DIAGNOSIS — I471 Supraventricular tachycardia: Secondary | ICD-10-CM | POA: Diagnosis not present

## 2016-04-20 DIAGNOSIS — D631 Anemia in chronic kidney disease: Secondary | ICD-10-CM | POA: Diagnosis not present

## 2016-04-20 DIAGNOSIS — D638 Anemia in other chronic diseases classified elsewhere: Secondary | ICD-10-CM | POA: Diagnosis not present

## 2016-04-20 DIAGNOSIS — R1310 Dysphagia, unspecified: Secondary | ICD-10-CM | POA: Diagnosis not present

## 2016-04-20 DIAGNOSIS — N179 Acute kidney failure, unspecified: Secondary | ICD-10-CM | POA: Diagnosis not present

## 2016-04-20 DIAGNOSIS — J441 Chronic obstructive pulmonary disease with (acute) exacerbation: Secondary | ICD-10-CM | POA: Diagnosis not present

## 2016-04-20 DIAGNOSIS — I10 Essential (primary) hypertension: Secondary | ICD-10-CM | POA: Diagnosis not present

## 2016-04-20 DIAGNOSIS — J9622 Acute and chronic respiratory failure with hypercapnia: Secondary | ICD-10-CM | POA: Diagnosis not present

## 2016-04-20 DIAGNOSIS — J449 Chronic obstructive pulmonary disease, unspecified: Secondary | ICD-10-CM | POA: Diagnosis not present

## 2016-04-20 DIAGNOSIS — J9612 Chronic respiratory failure with hypercapnia: Secondary | ICD-10-CM | POA: Diagnosis not present

## 2016-04-20 DIAGNOSIS — I469 Cardiac arrest, cause unspecified: Secondary | ICD-10-CM | POA: Diagnosis not present

## 2016-04-20 LAB — BASIC METABOLIC PANEL
Anion gap: 4 — ABNORMAL LOW (ref 5–15)
BUN: 33 mg/dL — ABNORMAL HIGH (ref 6–20)
CO2: 42 mmol/L — ABNORMAL HIGH (ref 22–32)
Calcium: 9.9 mg/dL (ref 8.9–10.3)
Chloride: 97 mmol/L — ABNORMAL LOW (ref 101–111)
Creatinine, Ser: 1.71 mg/dL — ABNORMAL HIGH (ref 0.44–1.00)
GFR calc Af Amer: 30 mL/min — ABNORMAL LOW (ref >60)
GFR calc non Af Amer: 26 mL/min — ABNORMAL LOW (ref >60)
Glucose, Bld: 127 mg/dL — ABNORMAL HIGH (ref 65–99)
Potassium: 4.3 mmol/L (ref 3.5–5.1)
Sodium: 143 mmol/L (ref 135–145)

## 2016-04-20 MED ORDER — LORAZEPAM 2 MG/ML PO CONC: 1.0000 mg | Freq: Four times a day (QID) | ORAL | Status: AC | PRN

## 2016-04-20 MED ORDER — QUETIAPINE FUMARATE 50 MG PO TABS: 25.0000 mg | ORAL_TABLET | Freq: Every day | ORAL | Status: DC

## 2016-04-20 MED ORDER — MORPHINE SULFATE (CONCENTRATE) 10 MG/0.5ML PO SOLN: 10.0000 mg | ORAL | Status: AC | PRN

## 2016-04-20 MED ORDER — QUETIAPINE FUMARATE 25 MG PO TABS: 25.0000 mg | ORAL_TABLET | Freq: Every day | ORAL | Status: AC

## 2016-04-20 NOTE — NC FL2 (Signed)
[place] MEDICAID FL2 LEVEL OF CARE SCREENING TOOL     IDENTIFICATION  Patient Name: Andrea Weber Birthdate: 1926-10-14 Sex: female Admission Date (Current Location): 04/18/2016  [facility] [place]   [facility] Address[facility]  [facility],  [address], [place] [id number]      Provider Number: [id number]  Attending Physician Name and Address:  Leroy SeaPrashant K Singh, MD  Relative Name and Phone Number:       Current Level of Care: Hospital Recommended Level of Care: Skilled Nursing Facility Prior Approval Number:    Date Approved/Denied:   PASRR Number:    Discharge Plan: SNF    Current Diagnoses: Patient Active Problem List   Diagnosis Date Noted  . Abnormal chest x-ray 03/07/2016  . Vitamin D deficiency 03/07/2016  . Altered mental status 03/01/2016  . Acute on chronic respiratory failure with hypercapnia (HCC) 03/01/2016  . Elevated serum creatinine 03/01/2016  . Hypernatremia 03/01/2016  . Acute respiratory failure with hypercapnia (HCC) 03/01/2016  . COPD exacerbation (HCC) 03/01/2016  . Hyperkalemia 03/01/2016  . Bilateral lower extremity edema 09/10/2015  . Chronic respiratory failure with hypercapnia (HCC) 09/03/2015  . GERD without esophagitis 09/03/2015  . Allergic rhinitis due to allergen 09/03/2015  . Anemia of chronic disease 09/03/2015  . COPD (chronic obstructive pulmonary disease) (HCC) 07/07/2015  . CKD (chronic kidney disease) stage 3, GFR 30-59 ml/min 07/07/2015  . Multifocal atrial tachycardia (HCC) 07/07/2015  . Acute encephalopathy 07/01/2015  . AKI (acute kidney injury) (HCC) 07/01/2015    Orientation RESPIRATION BLADDER Height & Weight     Self  O2 Incontinent Weight: 49.1 kg (108 lb 3.9 oz) Height:  5\' 1"  (154.9 cm)  BEHAVIORAL SYMPTOMS/MOOD NEUROLOGICAL BOWEL NUTRITION STATUS      Continent  (DYS 3)  AMBULATORY STATUS COMMUNICATION OF NEEDS Skin   Extensive Assist Verbally Normal                        Personal Care Assistance Level of Assistance  Bathing, Feeding, Dressing Bathing Assistance: Maximum assistance Feeding assistance: Limited assistance Dressing Assistance: Maximum assistance     Functional Limitations Info  Sight, Hearing, Speech Sight Info: Adequate Hearing Info: Adequate Speech Info: Adequate    SPECIAL CARE FACTORS FREQUENCY                       Contractures Contractures Info: Not present    Additional Factors Info  Code Status Code Status Info: DNR             Current Medications (04/20/2016):  This is the current hospital active medication list Current Facility-Administered Medications  Medication Dose Route Frequency Provider Last Rate Last Dose  . antiseptic oral rinse (CPC / CETYLPYRIDINIUM CHLORIDE 0.05%) solution 7 mL  7 mL Mouth Rinse q12n4p Barnetta ChapelSylvester I Ogbata, MD   7 mL at 04/19/16 1200  . budesonide (PULMICORT) nebulizer solution 0.25 mg  0.25 mg Nebulization BID Barnetta ChapelSylvester I Ogbata, MD   0.25 mg at 04/20/16 0839  . chlorhexidine (PERIDEX) 0.12 % solution 15 mL  15 mL Mouth Rinse BID Barnetta ChapelSylvester I Ogbata, MD   15 mL at 04/19/16 2218  . diltiazem (CARDIZEM) tablet 30 mg  30 mg Oral Q8H Barnetta ChapelSylvester I Ogbata, MD   30 mg at 04/20/16 0929  . doxycycline (VIBRAMYCIN) 100 mg in dextrose 5 % 250 mL IVPB  100 mg Intravenous BID Leroy SeaPrashant K Singh, MD   100 mg  at 04/20/16 0930  . enoxaparin (LOVENOX) injection 30 mg  30 mg Subcutaneous Q24H Barnetta Chapel, MD   30 mg at 04/20/16 0937  . famotidine (PEPCID) tablet 20 mg  20 mg Oral Daily Barnetta Chapel, MD   20 mg at 04/20/16 0929  . hydrALAZINE (APRESOLINE) injection 5 mg  5 mg Intravenous Q4H PRN Leda Gauze, NP   5 mg at 04/19/16 0636  . ipratropium-albuterol (DUONEB) 0.5-2.5 (3) MG/3ML nebulizer solution 3 mL  3 mL Nebulization Q6H PRN Barnetta Chapel, MD      . ipratropium-albuterol (DUONEB) 0.5-2.5 (3) MG/3ML nebulizer solution 3 mL  3 mL Nebulization BID Leroy Sea, MD   3 mL at 04/20/16 0839  . QUEtiapine (SEROQUEL) tablet 25 mg  25 mg Oral QHS Leroy Sea, MD         Discharge Medications: Please see discharge summary for a list of discharge medications.  Relevant Imaging Results:  Relevant Lab Results:   Additional Information SS # [id number]. Pt will need Palliative care at facility. Referral has been made to [facility].  Aaniyah Strohm, Dickey Gave, LCSW

## 2016-04-20 NOTE — Clinical Social Work Note (Signed)
Clinical Social Work Assessment  Patient Details  Name: Andrea Weber MRN: [id number] Date of Birth: 26-Oct-1926  Date of referral:  04/20/16               Reason for consult:  Facility Placement, Discharge Planning                Permission sought to share information with:  Facility Industrial/product designerContact Representative Permission granted to share information::  Yes, Verbal Permission Granted  Name::        Agency::     Relationship::     Contact Information:     Housing/Transportation Living arrangements for the past 2 months:  Skilled Nursing Facility Source of Information:  Other (Comment Required) (POA) Patient Interpreter Needed:  None Criminal Activity/Legal Involvement Pertinent to Current Situation/Hospitalization:  No - Comment as needed Significant Relationships:  Other(Comment) (POA) Lives with:  Facility Resident Do you feel safe going back to the place where you live?  Yes Need for family participation in patient care:  Yes (Comment)  Care giving concerns:  No concerns reported by POA.   Social Worker assessment / plan:  [facility] with metabolic encephalopathy. Pt is oriented to self only. CSW contacted pt's POA, Richrd SoxClarence Mattocks [phone], to assist with d/c planning. POA would [facility] ready for d/c. CSW has sent clinicals to SNF for review. Confirmation of d/c plan is pending. CSW will continue to follow to assist with d/c planning needs.  Employment status:  Retired Database administratornsurance information:  Managed Medicare PT Recommendations:  Not assessed at this time Information / Referral to community resources:     Patient/Family's Response to care:  POA agrees with pt returning to SNF with palliative care.  Patient/Family's Understanding of and Emotional Response to Diagnosis, Current Treatment, and Prognosis:  MD has updated POA as to pt's medical status. He is in agreement with Palliative Care services at  SNF.  Emotional Assessment Appearance:  Appears stated age Attitude/Demeanor/Rapport:  Unable to Assess Affect (typically observed):  Unable to Assess Orientation:  Oriented to Self Alcohol / Substance use:  Not Applicable Psych involvement (Current and /or in the community):  No (Comment)  Discharge Needs  Concerns to be addressed:  Discharge Planning Concerns Readmission within the last 30 days:  No Current discharge risk:  None Barriers to Discharge:  No Barriers Identified   Vennie HomansHaidinger, Eythan Jayne Lee, LCSW  [phone] 04/20/2016, 9:56 AM

## 2016-04-20 NOTE — Progress Notes (Signed)
Report called to Olegario MessierKathy, Charity fundraiserN at [facility].

## 2016-04-20 NOTE — Discharge Summary (Addendum)
Andrea Weber, is a 82 y.o. female  DOB July 10, 1926  MRN [id number].  Admission date:  04/18/2016  Admitting Physician  Barnetta Chapel, MD  Discharge Date:  04/20/2016   Primary MD  Margit Hanks, MD  Recommendations for primary care physician for things to follow:   Goal of care is gentle medical treatment directed towards comfort, if she declines again full comfort care/hospice. Involve palliative care ASAP along with speech therapy.  Avoid hypoxia by supplemental oxygen and nebulizer treatments.   Admission Diagnosis  Acute on chronic respiratory failure with hypercapnia (HCC) [J96.22] Altered mental status, unspecified altered mental status type [R41.82]   Discharge Diagnosis  Acute on chronic respiratory failure with hypercapnia (HCC) [J96.22] Altered mental status, unspecified altered mental status type [R41.82]     Principal Problem:   Acute encephalopathy Active Problems:   COPD (chronic obstructive pulmonary disease) (HCC)   CKD (chronic kidney disease) stage 3, GFR 30-59 ml/min   Acute on chronic respiratory failure with hypercapnia (HCC)      Past Medical History  Diagnosis Date  . Subarachnoid hemorrhage (HCC)   . Asthma   . Hypertension   . COPD (chronic obstructive pulmonary disease) (HCC)     On home O2  . Chronic kidney disease   . Anemia   . Dysrhythmia     Past Surgical History  Procedure Laterality Date  . Lung surgery    . Abdominal hysterectomy         HPI  from the history and physical done on the day of admission:     83 year old [place] American female, SNF resident, with past medical history significant for subarachnoid hemorrhage, asthma, COPD on home oxygen, Hypertension, diastolic dysfuntion, CKD and anemia. Currently, patient can not give any history  as patient is still sleepy from the effect of medication given to the patient to calm her down. Apparently, patient was said to be agitated earlier and was hitting the Healthcare staff. The history has come from collateral information and ER physician. Patient is said to have been brought to emergency department by EMS.  Patient was brought from SNF with report of hypoxia and not wearing her nasal cannula oxygen this morning. At baseline patient chronically wears home O2. This morning she was found to have her nasal cannula off and wrapped up in her hands. She was confused and disoriented. EMS reported on arrival, oxygen saturation was 70%. Patient was her on nonrebreather mask and en-route to the ER, had significant improvement of her mental status. However, upon arriving to the emergency department EMS reported patient became more drowsy again. CT head is non revealing. ABG reveals PH of 7.338 and PCO2 of 78 and O2 of 83. Platelet is 143, BUN is 26 and Scr of 1.86 (close to baseline). CXR is said to reveal interstitial edema, scarring and left mid atelectasis. As stated above, patient is not able to participate in any history at the moment.     Hospital Course:  1.Metabolic encephalopathy due to Acute Bronchitis causing Hypoxia. In a patient with early dementia = discussed with POA, baseline quality of care is not very good, she had similar admission recently, her encephalopathy seems to be exacerbated by hypoxia easliy, with supplemental oxygen and supportive care her oxygenationAnd mentation have improved and she is back to her baseline.  ABG shows compensated chronic hypercapnia, no wheezing on exam, will stop steroids. We'll continue supportive care with oxygen and nebulizer treatments. No signs of infection currently, question mild URI for which she was placed on doxycycline and now stopped.  Discussed with POA, baseline quality of care not very good, plan of care as gentle medical  treatment, if declines further full comfort care. She is DO NOT RESUSCITATE now. Goal of care as gentle medical treatment directed towards comfort. At SNF please involve palliative care ASAP.   2. History of asthma and COPD. Stable no wheezing, stop steroids, there was mention of mild cough, could have mild acute bronchitis for which she was treated with doxycycline. Continue oxygen and nebulizer treatments along with supportive care. ABG only showed chronic compensated hypercapnia along with evidence of hypoxia.  3. History of subarachnoid hemorrhage. CT head nonacute.  4. CK D stage IV. Baseline creatinine appears to be 1.8. Close to baseline.  5. Hypernatremia. Hold Lasix, gentle D5W.  6. Encephalopathy. Placed on dysphagia 3 diet for aspiration precaution, involve speech therapy at SNF, full feeding assistance and aspiration precautions to be continued.  7. Iron deficiency anemia. On ferrous sulfate continue.  8. GERD. Pepcid.  9. History of MAT. Stable on Cardizem.    Follow UP  Follow-up Information    Follow up with Margit Hanks, MD. Schedule an appointment as soon as possible for a visit in 1 week.   Specialty:  Internal Medicine   Contact information:   [address] [place] [address] [phone]        Consults obtained - Pall care over the phone  Discharge Condition: Guarded  Diet and Activity recommendation: See Discharge Instructions below  Discharge Instructions       Discharge Instructions    Diet - low sodium heart healthy    Complete by:  As directed      Discharge instructions    Complete by:  As directed   Follow with Primary MD Margit Hanks, MD in 7 days   Get CBC, CMP, 2 view Chest X ray checked  by Primary MD next visit.    Activity: As tolerated with Full fall precautions use walker/cane & assistance as needed   Disposition SNF with Palliative Care   Diet:   Dysphagia 3 diet with feeding assistance and aspiration  precautions.  For Heart failure patients - Check your Weight same time everyday, if you gain over 2 pounds, or you develop in leg swelling, experience more shortness of breath or chest pain, call your Primary MD immediately. Follow Cardiac Low Salt Diet and 1.5 lit/day fluid restriction.   On your next visit with your primary care physician please Get Medicines reviewed and adjusted.   Please request your Prim.MD to go over all Hospital Tests and Procedure/Radiological results at the follow up, please get all Hospital records sent to your Prim MD by signing hospital release before you go home.   If you experience worsening of your admission symptoms, develop shortness of breath, life threatening emergency, suicidal or homicidal thoughts you must seek medical attention immediately by calling 911 or calling your MD immediately  if symptoms less severe.  You Must read complete instructions/literature along with all the possible adverse reactions/side effects for all the Medicines you take and that have been prescribed to you. Take any new Medicines after you have completely understood and accpet all the possible adverse reactions/side effects.   Do not drive, operate heavy machinery, perform activities at heights, swimming or participation in water activities or provide baby sitting services if your were admitted for syncope or siezures until you have seen by Primary MD or a Neurologist and advised to do so again.  Do not drive when taking Pain medications.    Do not take more than prescribed Pain, Sleep and Anxiety Medications  Special Instructions: If you have smoked or chewed Tobacco  in the last 2 yrs please stop smoking, stop any regular Alcohol  and or any Recreational drug use.  Wear Seat belts while driving.   Please note  You were cared for by a hospitalist during your hospital stay. If you have any questions about your discharge medications or the care you received while you were  in the hospital after you are discharged, you can call the unit and asked to speak with the hospitalist on call if the hospitalist that took care of you is not available. Once you are discharged, your primary care physician will handle any further medical issues. Please note that NO REFILLS for any discharge medications will be authorized once you are discharged, as it is imperative that you return to your primary care physician (or establish a relationship with a primary care physician if you do not have one) for your aftercare needs so that they can reassess your need for medications and monitor your lab values.     Increase activity slowly    Complete by:  As directed              Discharge Medications       Medication List    STOP taking these medications        diphenhydrAMINE 25 mg capsule  Commonly known as:  BENADRYL     mirtazapine 7.5 MG tablet  Commonly known as:  REMERON      TAKE these medications        albuterol 108 (90 Base) MCG/ACT inhaler  Commonly known as:  PROVENTIL HFA;VENTOLIN HFA  Inhale 2 puffs into the lungs every 6 (six) hours as needed for wheezing or shortness of breath.     albuterol 0.63 MG/3ML nebulizer solution  Commonly known as:  ACCUNEB  Take 1 ampule by nebulization every 4 (four) hours as needed for wheezing.     cholecalciferol 1000 units tablet  Commonly known as:  VITAMIN D  Take 1,000 Units by mouth daily.     diltiazem 30 MG tablet  Commonly known as:  CARDIZEM  Take 1 tablet (30 mg total) by mouth every 8 (eight) hours.     famotidine 20 MG tablet  Commonly known as:  PEPCID  Take 1 tablet (20 mg total) by mouth daily.     ferrous sulfate 325 (65 FE) MG tablet  Take 325 mg by mouth daily with breakfast.     Fluticasone-Salmeterol 250-50 MCG/DOSE Aepb  Commonly known as:  ADVAIR  Inhale 1 puff into the lungs 2 (two) times daily.     furosemide 20 MG tablet  Commonly known as:  LASIX  Take 1 tablet (20 mg total) by mouth  daily.     ipratropium-albuterol 0.5-2.5 (3) MG/3ML Soln  Commonly known as:  DUONEB  Take 3 mLs by nebulization every 6 (six) hours as needed (for shortness of breath/wheezing).     LORazepam 2 MG/ML concentrated solution  Commonly known as:  ATIVAN  Take 0.5 mLs (1 mg total) by mouth every 6 (six) hours as needed for anxiety.     morphine CONCENTRATE 10 MG/0.5ML Soln concentrated solution  Take 0.5 mLs (10 mg total) by mouth every 3 (three) hours as needed for moderate pain or severe pain.     OXYGEN  Inhale 3 L/min into the lungs as needed (for shortness of breath or wheezing).     QUEtiapine 25 MG tablet  Commonly known as:  SEROQUEL  Take 1 tablet (25 mg total) by mouth at bedtime.     TWOCAL HN Liqd  Take 237 mLs by mouth 3 (three) times daily.        Major procedures and Radiology Reports - PLEASE review detailed and final reports for all details, in brief -     Ct Head Wo Contrast  04/18/2016  CLINICAL DATA:  Left facial droop and altered mental status today. Initial encounter. EXAM: CT HEAD WITHOUT CONTRAST TECHNIQUE: Contiguous axial images were obtained from the base of the skull through the vertex without intravenous contrast. COMPARISON:  Head CT scan 02/29/2016 and 06/26/2015. FINDINGS: Cortical atrophy and extensive chronic microvascular ischemic change are identified. No evidence of acute abnormality including hemorrhage, infarct, mass lesion, mass effect, midline shift or abnormal extra-axial fluid collection is seen. No hydrocephalus or pneumocephalus. The calvarium is intact. Imaged paranasal sinuses and mastoid air cells are clear IMPRESSION: No acute abnormality. Atrophy and extensive chronic microvascular ischemic change. Electronically Signed   By: Drusilla Kanner M.D.   On: 04/18/2016 09:11   Dg Chest Port 1 View  04/18/2016  CLINICAL DATA:  Low oxygen saturation. EXAM: PORTABLE CHEST 1 VIEW COMPARISON:  03/01/2016. FINDINGS: Surgical sutures noted over the  right lung. Cardiomegaly with mild pulmonary vascular and interstitial prominence suggesting mild congestive heart failure. Mild left mid lung field subsegmental atelectasis and/or scarring. No pleural effusion or pneumothorax. IMPRESSION: 1.  Surgical sutures noted over the right lung. 2. Cardiomegaly with mild pulmonary vascular prominence and bilateral interstitial prominence consistent with mild congestive heart failure. 3. Left mid lung field subsegmental atelectasis and/or scarring. Electronically Signed   By: Maisie Fus  Register   On: 04/18/2016 08:13    Micro Results    Recent Results (from the past 240 hour(s))  MRSA PCR Screening     Status: None   Collection Time: 04/18/16  9:39 AM  Result Value Ref Range Status   MRSA by PCR NEGATIVE NEGATIVE Final    Comment:        The GeneXpert MRSA Assay (FDA approved for NASAL specimens only), is one component of a comprehensive MRSA colonization surveillance program. It is not intended to diagnose MRSA infection nor to guide or monitor treatment for MRSA infections.   Urine culture     Status: None   Collection Time: 04/18/16  2:46 PM  Result Value Ref Range Status   Specimen Description URINE, CATHETERIZED  Final   Special Requests NONE  Final   Culture NO GROWTH Performed at [facility]   Final   Report Status 04/19/2016 FINAL  Final    Today   Subjective    Andrea Weber today has no headache,no chest abdominal pain,no new weakness tingling or numbness, feels much better.   Objective   Blood pressure 155/85,  pulse 85, temperature 98.4 F (36.9 C), temperature source Oral, resp. rate 16, height 5\' 1"  (1.549 m), weight 49.1 kg (108 lb 3.9 oz), SpO2 97 %.   Intake/Output Summary (Last 24 hours) at 04/20/16 0955 Last data filed at 04/20/16 0556  Gross per 24 hour  Intake    380 ml  Output    950 ml  Net   -570 ml    Exam Awake , mildly confused but much better than before, oriented 1, No new F.N deficits,  Normal affect Parks.AT,PERRAL Supple Neck,No JVD, No cervical lymphadenopathy appriciated.  Symmetrical Chest wall movement, Good air movement bilaterally, CTAB RRR,No Gallops,Rubs or new Murmurs, No Parasternal Heave +ve B.Sounds, Abd Soft, Non tender, No organomegaly appriciated, No rebound -guarding or rigidity. No Cyanosis, Clubbing or edema, No new Rash or bruise   Data Review   CBC w Diff: Lab Results  Component Value Date   WBC 6.6 04/19/2016   WBC 4.3 01/15/2016   HGB 9.6* 04/19/2016   HCT 29.1* 04/19/2016   PLT 153 04/19/2016   LYMPHOPCT 9 04/18/2016   MONOPCT 5 04/18/2016   EOSPCT 0 04/18/2016   BASOPCT 0 04/18/2016    CMP: Lab Results  Component Value Date   NA 143 04/20/2016   NA 148* 01/15/2016   K 4.3 04/20/2016   CL 97* 04/20/2016   CO2 42* 04/20/2016   BUN 33* 04/20/2016   BUN 34* 01/15/2016   CREATININE 1.71* 04/20/2016   CREATININE 1.6* 01/15/2016   GLU 72 01/15/2016   PROT 7.1 04/18/2016   ALBUMIN 4.1 04/18/2016   BILITOT 0.9 04/18/2016   ALKPHOS 61 04/18/2016   AST 20 04/18/2016   ALT 10* 04/18/2016  .   Total Time in preparing paper work, data evaluation and todays exam - 35 minutes  Leroy Sea M.D on 04/20/2016 at 9:55 AM  [facility]   Office  [phone]

## 2016-04-20 NOTE — Progress Notes (Signed)
PMT RN Note: Rec'd request from Dr Linna DarnerAnwar to ensure outpt Palliative Care with [facility] is notified of referral for care once pt returns to [facility]. Called [facility] and [facility].  Donn PieriniMelanie G. Oliver, RN, BSN, [facility] 04/20/2016 8:47 AM Cell [phone] 8:00-4:00 Monday-Friday Office [phone]

## 2016-04-20 NOTE — Evaluation (Signed)
Clinical/Bedside Swallow Evaluation Patient Details  Name: Andrea Weber MRN: [id number] Date of Birth: 1926-11-01  Today's Date: 04/20/2016 Time: SLP Start Time (ACUTE ONLY): [id number] SLP Stop Time (ACUTE ONLY): 0935 SLP Time Calculation (min) (ACUTE ONLY): 30 min  Past Medical History:  Past Medical History  Diagnosis Date  . Subarachnoid hemorrhage (HCC)   . Asthma   . Hypertension   . COPD (chronic obstructive pulmonary disease) (HCC)     On home O2  . Chronic kidney disease   . Anemia   . Dysrhythmia    Past Surgical History:  Past Surgical History  Procedure Laterality Date  . Lung surgery    . Abdominal hysterectomy     HPI:  82 [facility] deficits with decreased oxygen saturation requiring face mask oxgygen and bipap.  PMH + for asthma, COPD, ? dementia, SAH, CKD, anemia.  CXR 6/12 showed mild CHF.     Assessment / Plan / Recommendation Clinical Impression  Mild oral dysphagia due to loose dentures *pt does not have adhesive here but uses this normally*.   She was lethargic but wililng to accept po for evaluation and to take po medicine.  Decreased labial closure/seal on left noted with lingual protrusion at rest - ? [facility]  SLP provided pt with pudding with and without pill and orange juice via straw.   No indication of airway compromise, minimal delayed swallow.  Educated pt to results and recommendations and provided swallow precaution signs.  Pt observed to frequently belch and this was noted during bedside swallow evaluation August 2016.  She denies reflux symptoms and is on Pepcid.  Recommend soft/thin diet with precautions - [facility]     Aspiration Risk  Mild aspiration risk    Diet Recommendation Dysphagia 3 (Mech soft);Thin liquid   Liquid Administration via: Cup;Straw Medication Administration: Whole meds with puree Supervision: Staff to assist with self feeding Compensations: Slow  rate;Small sips/bites;Minimize environmental distractions Postural Changes: Seated upright at 90 degrees;Remain upright for at least 30 minutes after po intake    Other  Recommendations Oral Care Recommendations: Oral care BID   Follow up Recommendations  None    Frequency and Duration   n/a         Prognosis    n/a    Swallow Study   General Date of Onset: 04/20/16 HPI: 82 [facility] deficits with decreased oxygen saturation requiring face mask oxgygen and bipap.  PMH + for asthma, COPD, ? dementia, SAH, CKD, anemia.  CXR 6/12 showed mild CHF.   Type of Study: Bedside Swallow Evaluation Diet Prior to this Study: Thin liquids;Dysphagia 3 (soft) Temperature Spikes Noted: No Respiratory Status: Nasal cannula History of Recent Intubation: No Behavior/Cognition: Cooperative;Pleasant mood;Requires cueing Oral Cavity Assessment: Dry Oral Care Completed by SLP: Yes Oral Cavity - Dentition: Dentures, bottom;Dentures, top Self-Feeding Abilities: Needs assist Patient Positioning: Upright in bed Baseline Vocal Quality: Normal Volitional Cough: Strong Volitional Swallow: Unable to elicit    Oral/Motor/Sensory Function Overall Oral Motor/Sensory Function:  (pt with lingual protrusion to left, decreased labial closure)   Ice Chips Ice chips: Not tested Other Comments: pt dislikes ice   Thin Liquid Thin Liquid: Impaired Presentation: Straw Oral Phase Impairments: Reduced lingual movement/coordination;Reduced labial seal Oral Phase Functional Implications: Prolonged oral transit Pharyngeal  Phase Impairments: Suspected delayed Swallow;Multiple swallows    Nectar Thick Nectar Thick Liquid: Not tested  Honey Thick Honey Thick Liquid: Not tested   Puree Puree: Impaired Presentation: Self Fed;Spoon Oral Phase Impairments: Reduced lingual movement/coordination;Reduced labial seal Oral Phase Functional Implications: Prolonged oral transit   Solid   GO    Solid: Not tested Other Comments: pt declined to consume solids requriing mastication - dentures are illl fitting - loose and pt does not have adhesive here       Donavan Burnet, MS [facility] SLP [phone]

## 2016-04-20 NOTE — Discharge Instructions (Signed)
Follow with Primary MD Margit HanksALEXANDER, ANNE D, MD in 7 days.  Get CBC, CMP, 2 view Chest X ray checked  by Primary MD next visit.    Activity: As tolerated with Full fall precautions use walker/cane & assistance as needed   Disposition SNF with Palliative Care   Diet:   Dysphagia 3 diet with feeding assistance and aspiration precautions.  For Heart failure patients - Check your Weight same time everyday, if you gain over 2 pounds, or you develop in leg swelling, experience more shortness of breath or chest pain, call your Primary MD immediately. Follow Cardiac Low Salt Diet and 1.5 lit/day fluid restriction.   On your next visit with your primary care physician please Get Medicines reviewed and adjusted.   Please request your Prim.MD to go over all Hospital Tests and Procedure/Radiological results at the follow up, please get all Hospital records sent to your Prim MD by signing hospital release before you go home.   If you experience worsening of your admission symptoms, develop shortness of breath, life threatening emergency, suicidal or homicidal thoughts you must seek medical attention immediately by calling 911 or calling your MD immediately  if symptoms less severe.  You Must read complete instructions/literature along with all the possible adverse reactions/side effects for all the Medicines you take and that have been prescribed to you. Take any new Medicines after you have completely understood and accpet all the possible adverse reactions/side effects.   Do not drive, operate heavy machinery, perform activities at heights, swimming or participation in water activities or provide baby sitting services if your were admitted for syncope or siezures until you have seen by Primary MD or a Neurologist and advised to do so again.  Do not drive when taking Pain medications.    Do not take more than prescribed Pain, Sleep and Anxiety Medications  Special Instructions: If you have smoked  or chewed Tobacco  in the last 2 yrs please stop smoking, stop any regular Alcohol  and or any Recreational drug use.  Wear Seat belts while driving.   Please note  You were cared for by a hospitalist during your hospital stay. If you have any questions about your discharge medications or the care you received while you were in the hospital after you are discharged, you can call the unit and asked to speak with the hospitalist on call if the hospitalist that took care of you is not available. Once you are discharged, your primary care physician will handle any further medical issues. Please note that NO REFILLS for any discharge medications will be authorized once you are discharged, as it is imperative that you return to your primary care physician (or establish a relationship with a primary care physician if you do not have one) for your aftercare needs so that they can reassess your need for medications and monitor your lab values.

## 2016-04-20 NOTE — Progress Notes (Signed)
Pt / POA ( Mr. Fernand ParkinsMattocks ) are in [facility] is required. Pt requires oxygen. Medical necessity form completed. D/c Summary sent to SNF for review. Scripts included in d/c packet. # for report provided to nsg.   Cori RazorJamie Mayan Kloepfer LCSW [phone]

## 2016-04-21 ENCOUNTER — Non-Acute Institutional Stay (SKILLED_NURSING_FACILITY): Payer: Medicare Other | Admitting: Internal Medicine

## 2016-04-21 ENCOUNTER — Encounter: Payer: Self-pay | Admitting: Internal Medicine

## 2016-04-21 DIAGNOSIS — K219 Gastro-esophageal reflux disease without esophagitis: Secondary | ICD-10-CM

## 2016-04-21 DIAGNOSIS — J9622 Acute and chronic respiratory failure with hypercapnia: Secondary | ICD-10-CM | POA: Diagnosis not present

## 2016-04-21 DIAGNOSIS — J449 Chronic obstructive pulmonary disease, unspecified: Secondary | ICD-10-CM

## 2016-04-21 DIAGNOSIS — D638 Anemia in other chronic diseases classified elsewhere: Secondary | ICD-10-CM | POA: Diagnosis not present

## 2016-04-21 DIAGNOSIS — N179 Acute kidney failure, unspecified: Secondary | ICD-10-CM

## 2016-04-21 DIAGNOSIS — I471 Supraventricular tachycardia: Secondary | ICD-10-CM

## 2016-04-21 DIAGNOSIS — G9340 Encephalopathy, unspecified: Secondary | ICD-10-CM

## 2016-04-21 DIAGNOSIS — I609 Nontraumatic subarachnoid hemorrhage, unspecified: Secondary | ICD-10-CM

## 2016-04-21 DIAGNOSIS — E870 Hyperosmolality and hypernatremia: Secondary | ICD-10-CM

## 2016-04-21 NOTE — Progress Notes (Signed)
MRN: [id number] Name: Andrea Weber  Sex: female Age: 82 y.o. DOB: 05-28-1926  PSC #:  Facility/Room: [facility] / [address] A Level Of Care: SNF Provider: Randon Goldsmith. Lyn Hollingshead, MD Emergency Contacts: Extended Emergency Contact Information Primary Emergency Contact: Mattocks,Clarence Address: [address]          [place], [place] [address] [place] Home Phone: [phone] Work Phone: [phone] Relation: Other Secondary Emergency Contact: Hetty Ely [place] Mobile Phone: [phone] Relation: Other  Code Status: DNR  Allergies: Review of patient's allergies indicates no known allergies.  Chief Complaint  Patient presents with  . New Admit To SNF    Admit to Facility    HPI: Patient is 82 y.o. female with past medical history significant for subarachnoid hemorrhage, asthma, COPD on home oxygen, Hypertension, diastolic dysfuntion, CKD and anemia. Currently, patient can not give any history as patient is still sleepy from the effect of medication given to the patient to calm her down. Apparently, patient was said to be agitated earlier and was hitting the Healthcare staff. Pt was admitted to [facility] from 6/12-14 where she was treated for acute on chronic resp failure 2/2 COPD and bronchitis. Hospital course was complicated by acute encephalopathy aND hypernatremia. Pt is admitted to SNF with generalized weakness. While at SNF pt will be followed for anemia, tx with iron, MAT, tx with diltiazem and GERD tx with pepcid.  Past Medical History  Diagnosis Date  . Subarachnoid hemorrhage (HCC)   . Asthma   . Hypertension   . COPD (chronic obstructive pulmonary disease) (HCC)     On home O2  . Chronic kidney disease   . Anemia   . Dysrhythmia     Past Surgical History  Procedure Laterality Date  . Lung surgery    . Abdominal hysterectomy        Medication List       This list is accurate as of: 04/21/16 11:59 PM.  Always use your most  recent med list.               albuterol 108 (90 Base) MCG/ACT inhaler  Commonly known as:  PROVENTIL HFA;VENTOLIN HFA  Inhale 2 puffs into the lungs every 6 (six) hours as needed for wheezing or shortness of breath.     albuterol 0.63 MG/3ML nebulizer solution  Commonly known as:  ACCUNEB  Take 1 ampule by nebulization every 4 (four) hours as needed for wheezing.     cholecalciferol 1000 units tablet  Commonly known as:  VITAMIN D  Take 1,000 Units by mouth daily.     diltiazem 30 MG tablet  Commonly known as:  CARDIZEM  Take 30 mg by mouth every 8 (eight) hours.     famotidine 20 MG tablet  Commonly known as:  PEPCID  Take 1 tablet (20 mg total) by mouth daily.     ferrous sulfate 325 (65 FE) MG tablet  Take 325 mg by mouth daily with breakfast.     Fluticasone-Salmeterol 250-50 MCG/DOSE Aepb  Commonly known as:  ADVAIR  Inhale 1 puff into the lungs 2 (two) times daily.     furosemide 20 MG tablet  Commonly known as:  LASIX  Take 1 tablet (20 mg total) by mouth daily.     ipratropium-albuterol 0.5-2.5 (3) MG/3ML Soln  Commonly known as:  DUONEB  Take 3 mLs by nebulization every 6 (six) hours as needed (for shortness of breath/wheezing).     LORazepam 2 MG/ML  concentrated solution  Commonly known as:  ATIVAN  Take 0.5 mLs (1 mg total) by mouth every 6 (six) hours as needed for anxiety.     morphine CONCENTRATE 10 MG/0.5ML Soln concentrated solution  Take 0.5 mLs (10 mg total) by mouth every 3 (three) hours as needed for moderate pain or severe pain.     OXYGEN  Inhale 3 L/min into the lungs as needed (for shortness of breath or wheezing).     QUEtiapine 25 MG tablet  Commonly known as:  SEROQUEL  Take 1 tablet (25 mg total) by mouth at bedtime.     TWOCAL HN Liqd  Take 237 mLs by mouth 3 (three) times daily.        Meds ordered this encounter  Medications  . diltiazem (CARDIZEM) 30 MG tablet    Sig: Take 30 mg by mouth every 8 (eight) hours.      There is no immunization history on file for this patient.  Social History  Substance Use Topics  . Smoking status: Former Smoker    Types: Cigarettes  . Smokeless tobacco: Never Used  . Alcohol Use: No    Family history is   Family History  Problem Relation Age of Onset  . Hypertension Mother   . Hypertension Father       Review of Systems  UTO 2/2 dementia; nursing without concerns     Filed Vitals:   04/21/16 0943  BP: 132/88  Pulse: 130  Temp: 98 F (36.7 C)  Resp: 30    SpO2 Readings from Last 1 Encounters:  04/21/16 97%        Physical Exam  GENERAL APPEARANCE: Alert, min conversant, very pleasant BF, No acute distress.  SKIN: No diaphoresis rash HEAD: Normocephalic, atraumatic  EYES: Conjunctiva/lids clear. Pupils round, reactive. EOMs intact.  EARS: External exam WNL, canals clear. Hearing grossly normal.  NOSE: No deformity or discharge.  MOUTH/THROAT: Lips w/o lesions  RESPIRATORY: Breathing is even, unlabored. Lung sounds are clear   CARDIOVASCULAR: Heart irreg 3/6 systolic  Murmur, no  rubs or gallops. No peripheral edema.   GASTROINTESTINAL: Abdomen is soft, non-tender, not distended w/ normal bowel sounds. GENITOURINARY: Bladder non tender, not distended  MUSCULOSKELETAL: No abnormal joints or musculature NEUROLOGIC:  Cranial nerves 2-12 grossly intact. Moves all extremities  PSYCHIATRIC: Mood and affect appropriate to situation with dementia, no behavioral issues  Patient Active Problem List   Diagnosis Date Noted  . SAH (subarachnoid hemorrhage) (HCC) 03/21/2016  . Abnormal chest x-ray 03/07/2016  . Vitamin D deficiency 03/07/2016  . Altered mental status 03/01/2016  . Acute on chronic respiratory failure with hypercapnia (HCC) 03/01/2016  . Elevated serum creatinine 03/01/2016  . Hypernatremia 03/01/2016  . Acute respiratory failure with hypercapnia (HCC) 03/01/2016  . COPD exacerbation (HCC) 03/01/2016  . Hyperkalemia  03/01/2016  . Bilateral lower extremity edema 09/10/2015  . Chronic respiratory failure with hypercapnia (HCC) 09/03/2015  . GERD without esophagitis 09/03/2015  . Allergic rhinitis due to allergen 09/03/2015  . Anemia of chronic disease 09/03/2015  . COPD (chronic obstructive pulmonary disease) (HCC) 07/07/2015  . CKD (chronic kidney disease) stage 4, GFR 15-29 ml/min (HCC) 07/07/2015  . Multifocal atrial tachycardia (HCC) 07/07/2015  . Acute encephalopathy 07/01/2015  . AKI (acute kidney injury) (HCC) 07/01/2015       Component Value Date/Time   WBC 6.6 04/19/2016 0315   WBC 4.3 01/15/2016   RBC 3.13* 04/19/2016 0315   HGB 9.6* 04/19/2016 0315   HCT 29.1*  04/19/2016 0315   PLT 153 04/19/2016 0315   MCV 93.0 04/19/2016 0315   LYMPHSABS 0.7 04/18/2016 0740   MONOABS 0.4 04/18/2016 0740   EOSABS 0.0 04/18/2016 0740   BASOSABS 0.0 04/18/2016 0740        Component Value Date/Time   NA 143 04/20/2016 0601   NA 148* 01/15/2016   K 4.3 04/20/2016 0601   CL 97* 04/20/2016 0601   CO2 42* 04/20/2016 0601   GLUCOSE 127* 04/20/2016 0601   BUN 33* 04/20/2016 0601   BUN 34* 01/15/2016   CREATININE 1.71* 04/20/2016 0601   CREATININE 1.6* 01/15/2016   CALCIUM 9.9 04/20/2016 0601   PROT 7.1 04/18/2016 0740   ALBUMIN 4.1 04/18/2016 0740   AST 20 04/18/2016 0740   ALT 10* 04/18/2016 0740   ALKPHOS 61 04/18/2016 0740   BILITOT 0.9 04/18/2016 0740   GFRNONAA 26* 04/20/2016 0601   GFRAA 30* 04/20/2016 0601    Lab Results  Component Value Date   HGBA1C 4.9 06/26/2015    No results found for: CHOL, HDL, LDLCALC, LDLDIRECT, TRIG, CHOLHDL   Ct Head Wo Contrast  04/18/2016  CLINICAL DATA:  Left facial droop and altered mental status today. Initial encounter. EXAM: CT HEAD WITHOUT CONTRAST TECHNIQUE: Contiguous axial images were obtained from the base of the skull through the vertex without intravenous contrast. COMPARISON:  Head CT scan 02/29/2016 and 06/26/2015. FINDINGS: Cortical  atrophy and extensive chronic microvascular ischemic change are identified. No evidence of acute abnormality including hemorrhage, infarct, mass lesion, mass effect, midline shift or abnormal extra-axial fluid collection is seen. No hydrocephalus or pneumocephalus. The calvarium is intact. Imaged paranasal sinuses and mastoid air cells are clear IMPRESSION: No acute abnormality. Atrophy and extensive chronic microvascular ischemic change. Electronically Signed   By: Drusilla Kanner M.D.   On: 04/18/2016 09:11   Dg Chest Port 1 View  04/18/2016  CLINICAL DATA:  Low oxygen saturation. EXAM: PORTABLE CHEST 1 VIEW COMPARISON:  03/01/2016. FINDINGS: Surgical sutures noted over the right lung. Cardiomegaly with mild pulmonary vascular and interstitial prominence suggesting mild congestive heart failure. Mild left mid lung field subsegmental atelectasis and/or scarring. No pleural effusion or pneumothorax. IMPRESSION: 1.  Surgical sutures noted over the right lung. 2. Cardiomegaly with mild pulmonary vascular prominence and bilateral interstitial prominence consistent with mild congestive heart failure. 3. Left mid lung field subsegmental atelectasis and/or scarring. Electronically Signed   By: Maisie Fus  Register   On: 04/18/2016 08:13    Not all labs, radiology exams or other studies done during hospitalization come through on my EPIC note; however they are reviewed by me.    Assessment and Plan  Metabolic encephalopathy/chronic resp failure - due to Acute Bronchitis causing Hypoxia. In a patient with early dementia = discussed with POA, baseline quality of care is not very good, she had similar admission recently, her encephalopathy seems to be exacerbated by hypoxia easliy, with supplemental oxygen and supportive care her oxygenationAnd mentation have improved and she is back to her baseline.ABG shows compensated chronic hypercapnia SNF - cont chronic O2 .  2. History of asthma and COPD. Stable no  wheezing, stop steroids, there was mention of mild cough, could have mild acute bronchitis for which she was treated with doxycycline. SNF - Continue oxygen and nebulizer treatments along with supportive care  3. History of subarachnoid hemorrhage. CT head nonacute.  4. CK D stage IV. Baseline creatinine appears to be 1.8. SNF - will f/u BMP  5. Hypernatremia. Hold Lasix, gentle  D5W. SNF - encourage fluids, f/u BMP  6. Encephalopathy. Placed on dysphagia 3 diet for aspiration precaution SNF -  i speech therapy to eval , full feeding assistance and aspiration precautions to be continued.  7. Iron deficiency anemia SNF - . Cont  ferrous sulfate; f/u CBC  8. GERD SNF - chronic and stable;cont . Pepcid.  9. History of MAT SNF - . Stable on Cardizem; cont cardizem    Time spent > 45 min;> 50% of time with patient was spent reviewing records, labs, tests and studies, counseling and developing plan of care  Thurston Hole D. Lyn Hollingshead, MD

## 2016-04-27 ENCOUNTER — Encounter: Payer: Self-pay | Admitting: Internal Medicine

## 2016-04-27 DIAGNOSIS — I609 Nontraumatic subarachnoid hemorrhage, unspecified: Secondary | ICD-10-CM | POA: Insufficient documentation

## 2016-05-04 ENCOUNTER — Encounter: Payer: Self-pay | Admitting: Adult Health

## 2016-05-07 DIAGNOSIS — 419620001 Death: Secondary | SNOMED CT | POA: Diagnosis not present

## 2016-05-07 DEATH — deceased

## 2016-06-28 IMAGING — CT CT HEAD W/O CM
3 of 4 series · 15 of 47 positions shown, 18 images · non-contrast
Comparison: Head CT scan 02/29/2016 and 06/26/2015.

CLINICAL DATA: Left facial droop and altered mental status today.
Initial encounter.

EXAM:
CT HEAD WITHOUT CONTRAST
TECHNIQUE: Contiguous axial images were obtained from the base of the skull
through the vertex without intravenous contrast.

[Series 2: head w/o · axial · non-contrast · 0.45mm/px · z∈[-121,-1]mm · 9 of 30 slices shown, 12 images]
[im 3/30  brain]
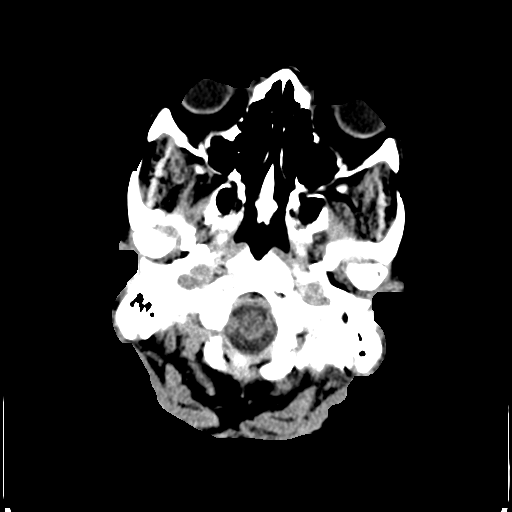
[im 3/30  bone]
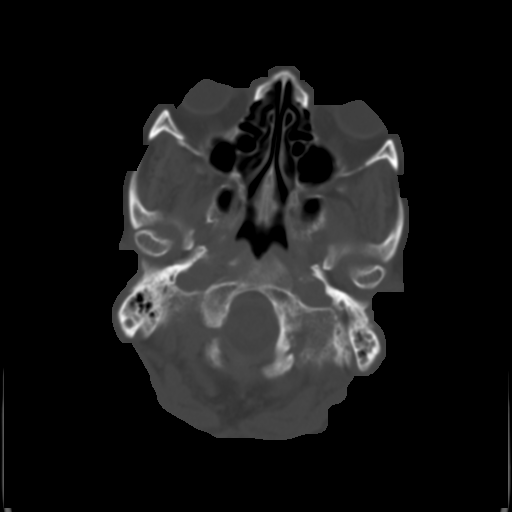
[im 7/30  brain]
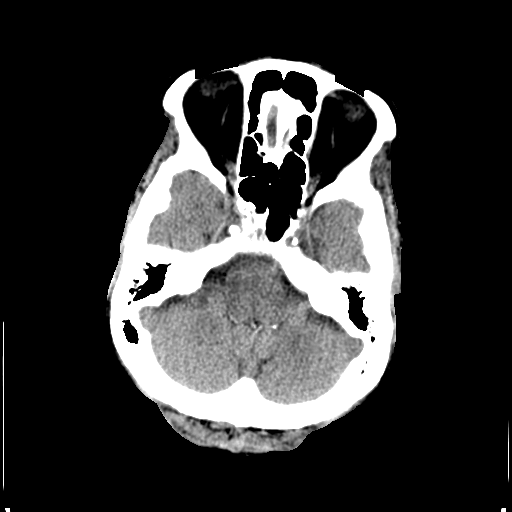
[im 9/30  brain]
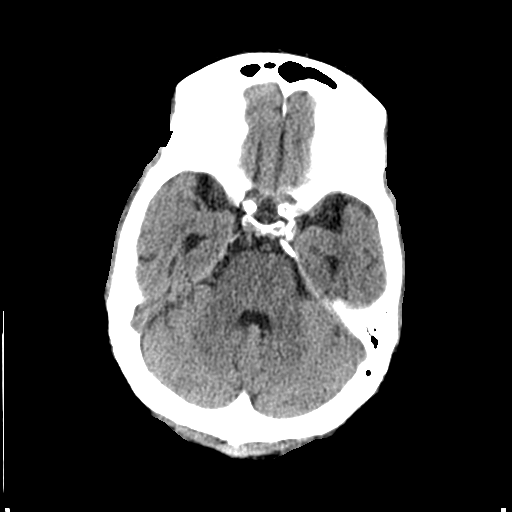
[im 13/30  brain]
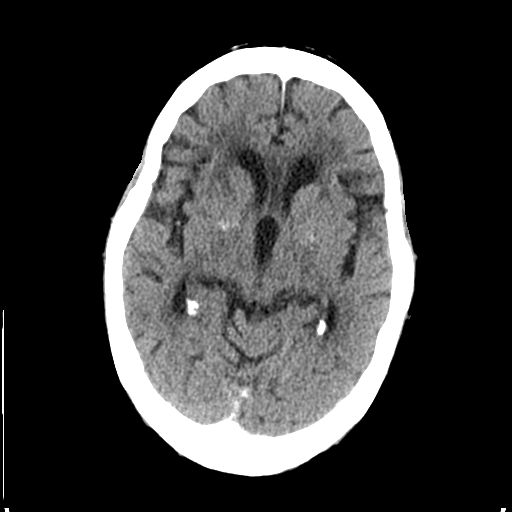
[im 15/30  brain]
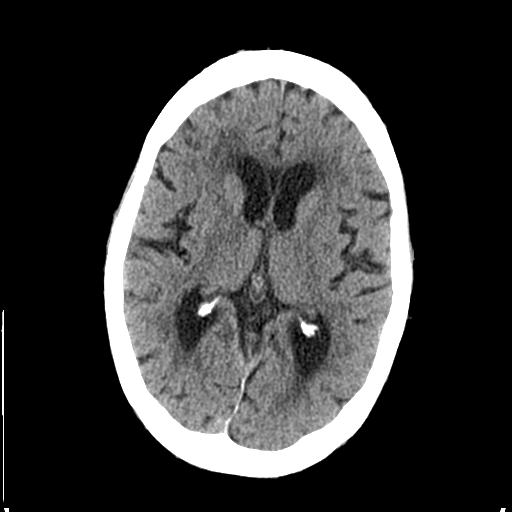
[im 15/30  bone]
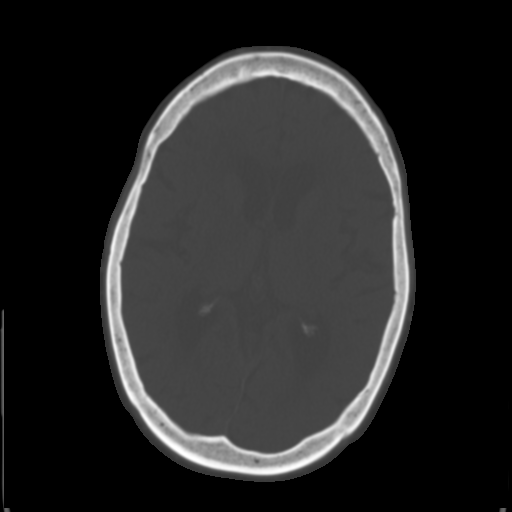
[im 17/30  brain]
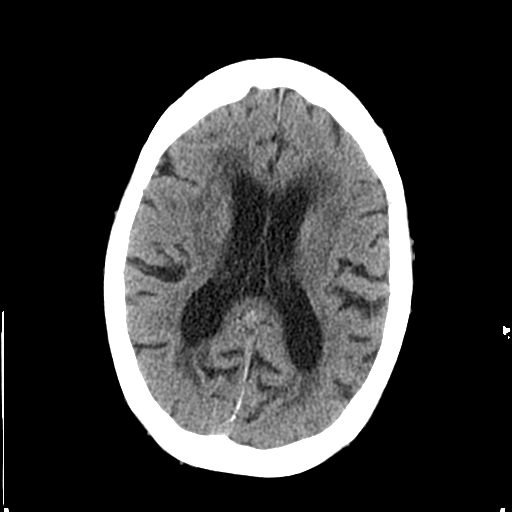
[im 21/30  brain]
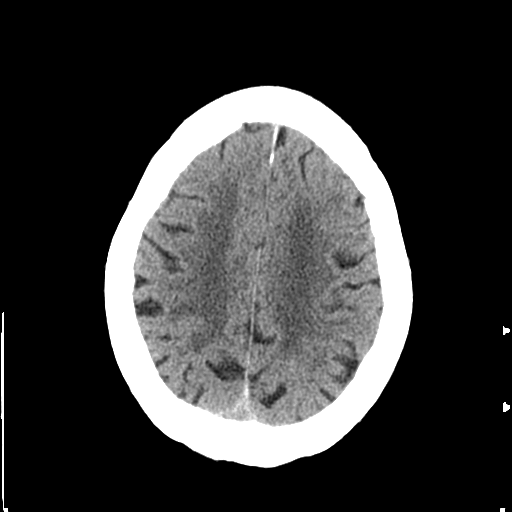
[im 23/30  brain]
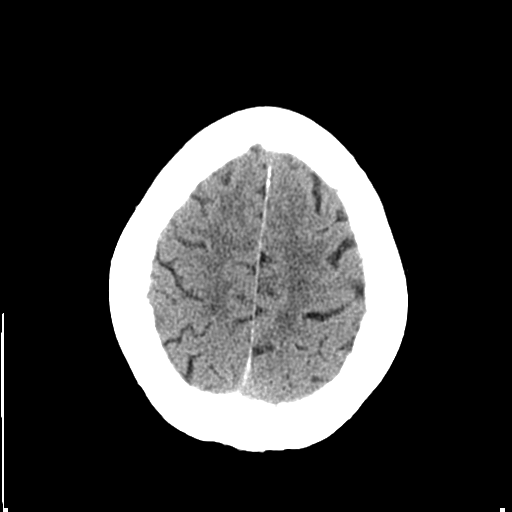
[im 27/30  brain]
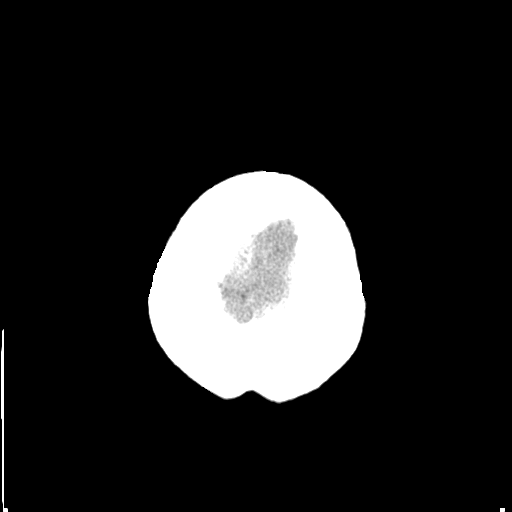
[im 27/30  bone]
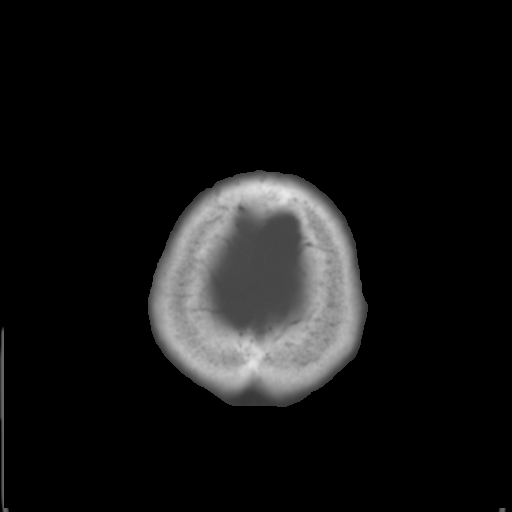

[Series 5: coronal · coronal · 0.28mm/px · 3 of 68 slices shown]
[im 23/68  brain]
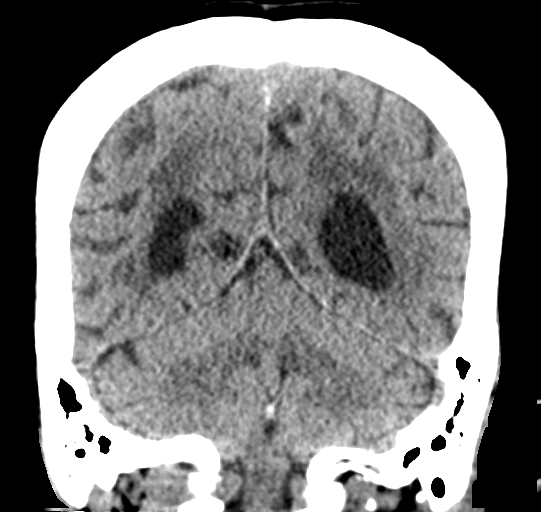
[im 30/68  brain]
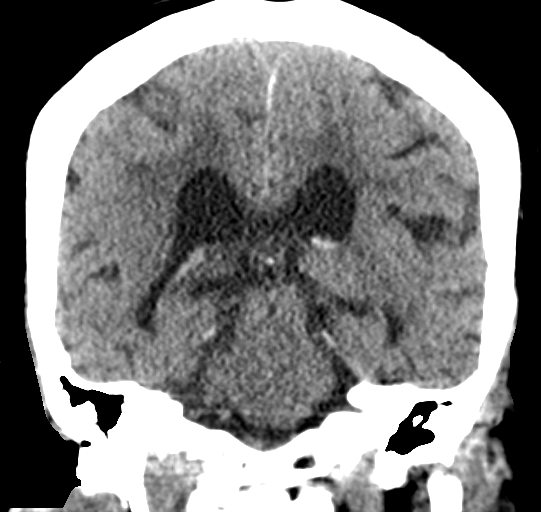
[im 38/68  brain]
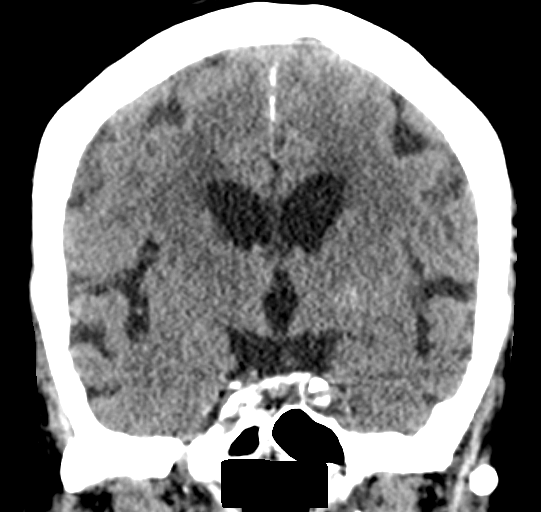

[Series 6: sagittal · sagittal · 0.27mm/px · 3 of 58 slices shown]
[im 20/58  brain]
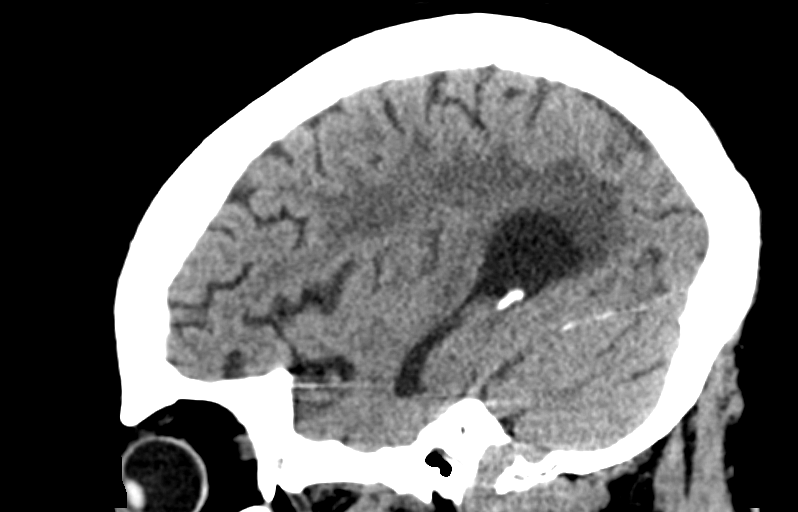
[im 29/58  brain]
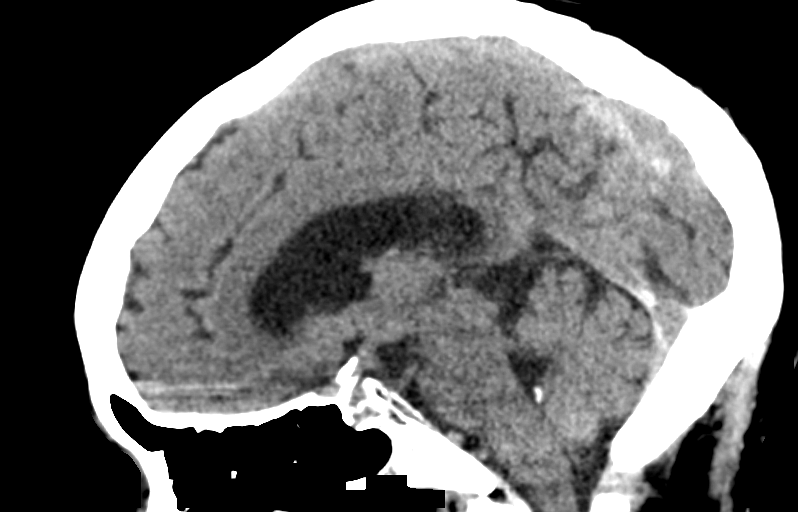
[im 39/58  brain]
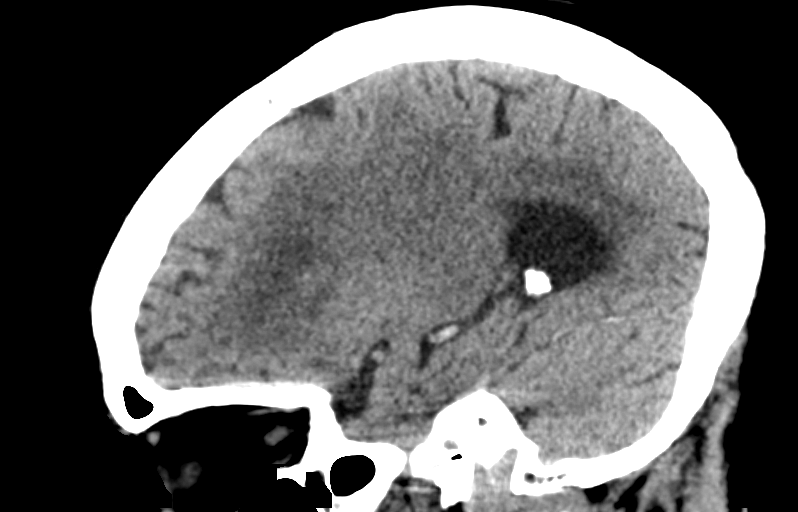

[15 of 47 positions shown; findings below may reference images not displayed]

FINDINGS: Cortical atrophy and extensive chronic microvascular ischemic change
are identified. No evidence of acute abnormality including
hemorrhage, infarct, mass lesion, mass effect, midline shift or
abnormal extra-axial fluid collection is seen. No hydrocephalus or
pneumocephalus. The calvarium is intact. Imaged paranasal sinuses
and mastoid air cells are clear
IMPRESSION: No acute abnormality.

Atrophy and extensive chronic microvascular ischemic change.

## 2016-06-28 IMAGING — DX DG CHEST 1V PORT
1 series · 1 of 1 positions shown · non-contrast
Comparison: 03/01/2016.

CLINICAL DATA: Low oxygen saturation.

EXAM:
PORTABLE CHEST 1 VIEW

[chest ap]
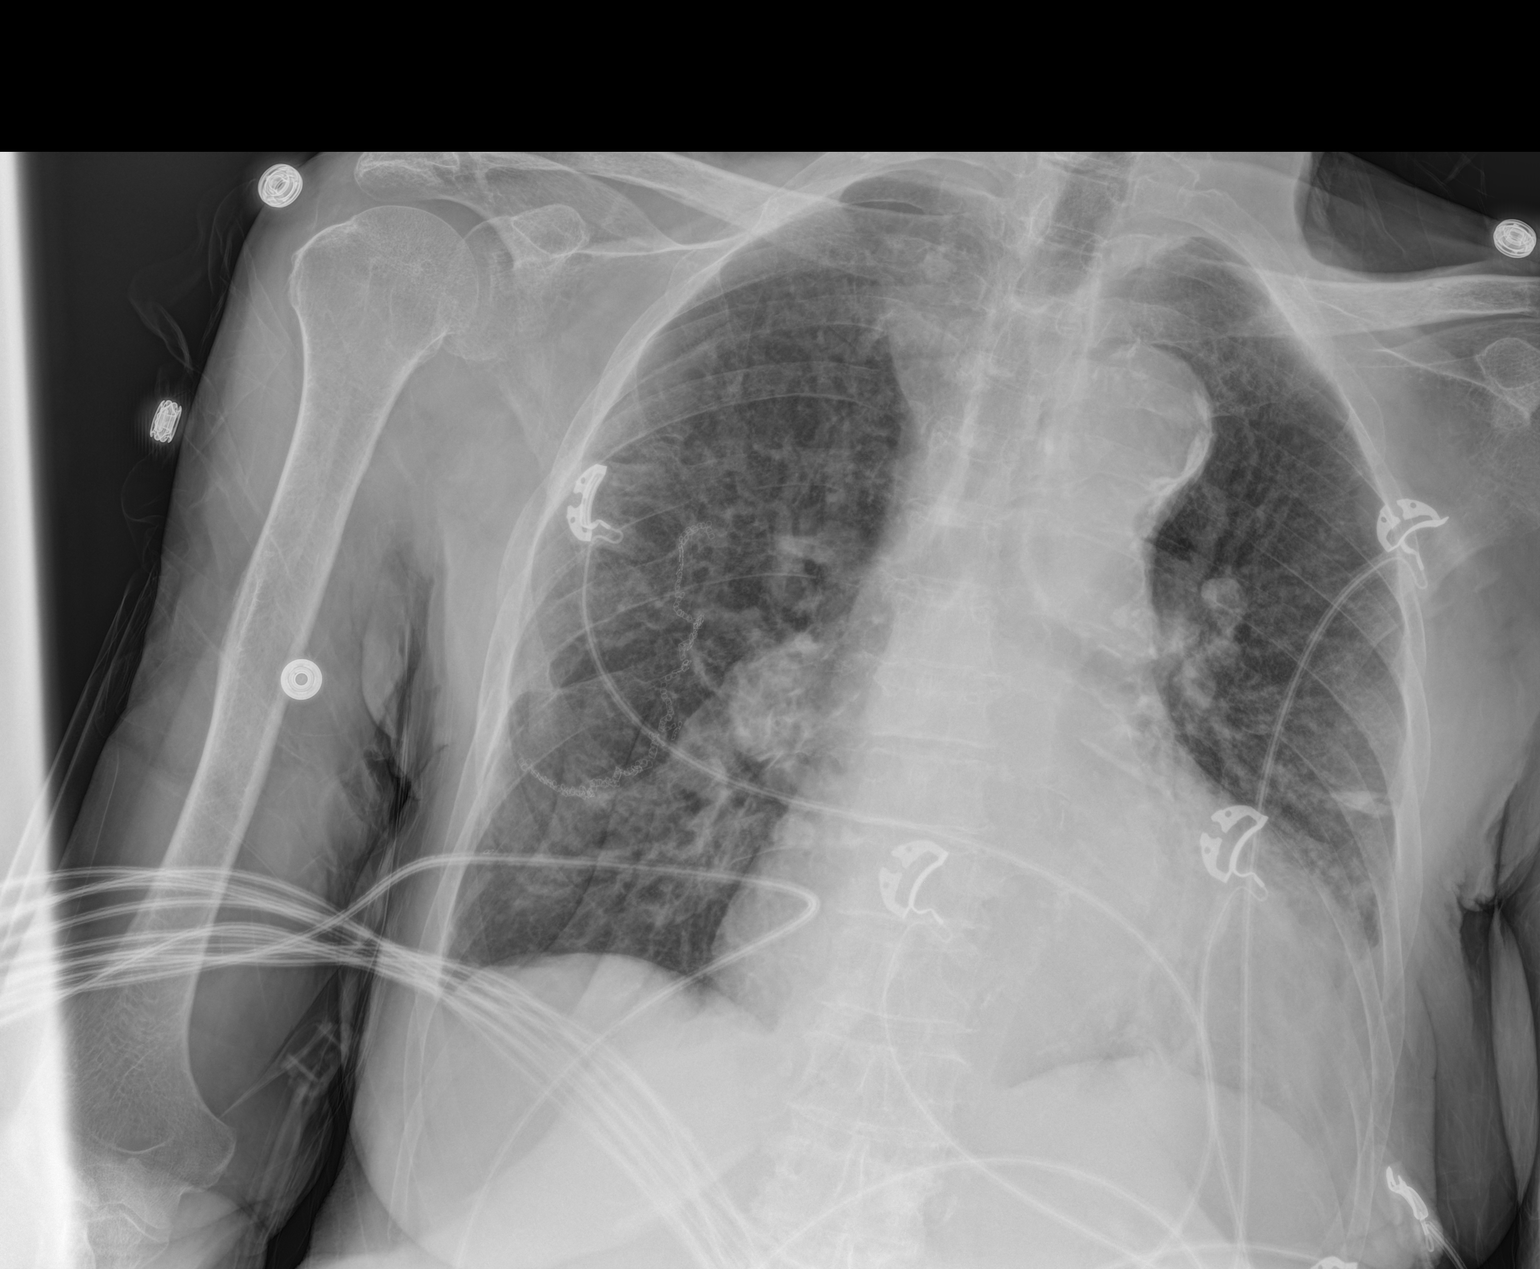

[1 of 1 positions shown; findings below may reference images not displayed]

FINDINGS: Surgical sutures noted over the right lung. Cardiomegaly with mild
pulmonary vascular and interstitial prominence suggesting mild
congestive heart failure. Mild left mid lung field subsegmental
atelectasis and/or scarring. No pleural effusion or pneumothorax.
IMPRESSION: 1.  Surgical sutures noted over the right lung.

2. Cardiomegaly with mild pulmonary vascular prominence and
bilateral interstitial prominence consistent with mild congestive
heart failure.

3. Left mid lung field subsegmental atelectasis and/or scarring.
